# Patient Record
Sex: Female | Born: 1935
Health system: Southern US, Community
[De-identification: ages and names within clinical notes are randomized; demographics above are authoritative.]

## PROBLEM LIST (undated history)

## (undated) DIAGNOSIS — I341 Nonrheumatic mitral (valve) prolapse: Secondary | ICD-10-CM

## (undated) DIAGNOSIS — Z950 Presence of cardiac pacemaker: Secondary | ICD-10-CM

## (undated) DIAGNOSIS — R51 Headache: Secondary | ICD-10-CM

## (undated) DIAGNOSIS — M26629 Arthralgia of temporomandibular joint, unspecified side: Secondary | ICD-10-CM

## (undated) DIAGNOSIS — M4800 Spinal stenosis, site unspecified: Secondary | ICD-10-CM

## (undated) DIAGNOSIS — IMO0002 Reserved for concepts with insufficient information to code with codable children: Secondary | ICD-10-CM

## (undated) DIAGNOSIS — E039 Hypothyroidism, unspecified: Secondary | ICD-10-CM

## (undated) DIAGNOSIS — F419 Anxiety disorder, unspecified: Secondary | ICD-10-CM

## (undated) DIAGNOSIS — I491 Atrial premature depolarization: Secondary | ICD-10-CM

## (undated) DIAGNOSIS — R519 Headache, unspecified: Secondary | ICD-10-CM

## (undated) DIAGNOSIS — E785 Hyperlipidemia, unspecified: Secondary | ICD-10-CM

## (undated) DIAGNOSIS — R002 Palpitations: Secondary | ICD-10-CM

## (undated) DIAGNOSIS — G8929 Other chronic pain: Secondary | ICD-10-CM

## (undated) HISTORY — DX: Anxiety disorder, unspecified: F41.9

## (undated) HISTORY — PX: APPENDECTOMY: SHX54

## (undated) HISTORY — DX: Other chronic pain: G89.29

## (undated) HISTORY — DX: Palpitations: R00.2

## (undated) HISTORY — PX: TONSILLECTOMY: SUR1361

## (undated) HISTORY — PX: BREAST BIOPSY: SHX20

## (undated) HISTORY — DX: Hyperlipidemia, unspecified: E78.5

## (undated) HISTORY — DX: Reserved for concepts with insufficient information to code with codable children: IMO0002

## (undated) HISTORY — DX: Arthralgia of temporomandibular joint, unspecified side: M26.629

## (undated) HISTORY — PX: BACK SURGERY: SHX140

## (undated) HISTORY — DX: Hypothyroidism, unspecified: E03.9

## (undated) HISTORY — DX: Spinal stenosis, site unspecified: M48.00

## (undated) HISTORY — DX: Headache: R51

## (undated) HISTORY — DX: Headache, unspecified: R51.9

## (undated) HISTORY — DX: Atrial premature depolarization: I49.1

## (undated) HISTORY — PX: LUMBAR MICRODISCECTOMY: SHX99

## (undated) HISTORY — PX: CHOLECYSTECTOMY: SHX55

## (undated) HISTORY — DX: Nonrheumatic mitral (valve) prolapse: I34.1

---
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
## 1968-07-05 HISTORY — PX: BREAST EXCISIONAL BIOPSY: SUR124

## 1998-04-14 ENCOUNTER — Other Ambulatory Visit: Admission: RE | Admit: 1998-04-14 | Discharge: 1998-04-14 | Payer: Self-pay | Admitting: *Deleted

## 1999-05-19 ENCOUNTER — Other Ambulatory Visit: Admission: RE | Admit: 1999-05-19 | Discharge: 1999-05-19 | Payer: Self-pay | Admitting: *Deleted

## 1999-08-11 ENCOUNTER — Encounter: Payer: Self-pay | Admitting: *Deleted

## 1999-08-11 ENCOUNTER — Encounter: Admission: RE | Admit: 1999-08-11 | Discharge: 1999-08-11 | Payer: Self-pay | Admitting: *Deleted

## 2000-08-12 ENCOUNTER — Encounter: Payer: Self-pay | Admitting: Internal Medicine

## 2000-08-12 ENCOUNTER — Encounter: Admission: RE | Admit: 2000-08-12 | Discharge: 2000-08-12 | Payer: Self-pay | Admitting: Internal Medicine

## 2000-09-19 ENCOUNTER — Encounter: Admission: RE | Admit: 2000-09-19 | Discharge: 2000-09-19 | Payer: Self-pay | Admitting: Internal Medicine

## 2000-09-19 ENCOUNTER — Encounter: Payer: Self-pay | Admitting: Internal Medicine

## 2000-11-22 ENCOUNTER — Ambulatory Visit (HOSPITAL_COMMUNITY): Admission: RE | Admit: 2000-11-22 | Discharge: 2000-11-22 | Payer: Self-pay | Admitting: Gastroenterology

## 2000-11-24 ENCOUNTER — Other Ambulatory Visit: Admission: RE | Admit: 2000-11-24 | Discharge: 2000-11-24 | Payer: Self-pay | Admitting: *Deleted

## 2000-12-13 ENCOUNTER — Ambulatory Visit (HOSPITAL_COMMUNITY): Admission: RE | Admit: 2000-12-13 | Discharge: 2000-12-13 | Payer: Self-pay | Admitting: Gastroenterology

## 2001-03-01 ENCOUNTER — Emergency Department (HOSPITAL_COMMUNITY): Admission: EM | Admit: 2001-03-01 | Discharge: 2001-03-01 | Payer: Self-pay | Admitting: Emergency Medicine

## 2001-05-12 ENCOUNTER — Inpatient Hospital Stay (HOSPITAL_COMMUNITY): Admission: EM | Admit: 2001-05-12 | Discharge: 2001-05-14 | Payer: Self-pay

## 2001-05-13 ENCOUNTER — Encounter: Payer: Self-pay | Admitting: Internal Medicine

## 2001-08-14 ENCOUNTER — Encounter: Payer: Self-pay | Admitting: Internal Medicine

## 2001-08-14 ENCOUNTER — Encounter: Admission: RE | Admit: 2001-08-14 | Discharge: 2001-08-14 | Payer: Self-pay | Admitting: Internal Medicine

## 2001-10-15 ENCOUNTER — Emergency Department (HOSPITAL_COMMUNITY): Admission: EM | Admit: 2001-10-15 | Discharge: 2001-10-15 | Payer: Self-pay | Admitting: Emergency Medicine

## 2001-11-30 ENCOUNTER — Other Ambulatory Visit: Admission: RE | Admit: 2001-11-30 | Discharge: 2001-11-30 | Payer: Self-pay | Admitting: *Deleted

## 2002-01-16 ENCOUNTER — Emergency Department (HOSPITAL_COMMUNITY): Admission: EM | Admit: 2002-01-16 | Discharge: 2002-01-16 | Payer: Self-pay | Admitting: Emergency Medicine

## 2002-10-08 ENCOUNTER — Encounter: Admission: RE | Admit: 2002-10-08 | Discharge: 2002-10-08 | Payer: Self-pay | Admitting: Neurosurgery

## 2002-10-08 ENCOUNTER — Encounter: Payer: Self-pay | Admitting: Orthopedic Surgery

## 2002-12-04 ENCOUNTER — Other Ambulatory Visit: Admission: RE | Admit: 2002-12-04 | Discharge: 2002-12-04 | Payer: Self-pay | Admitting: *Deleted

## 2003-03-06 ENCOUNTER — Encounter: Admission: RE | Admit: 2003-03-06 | Discharge: 2003-04-18 | Payer: Self-pay | Admitting: Neurology

## 2003-12-05 ENCOUNTER — Other Ambulatory Visit: Admission: RE | Admit: 2003-12-05 | Discharge: 2003-12-05 | Payer: Self-pay | Admitting: *Deleted

## 2004-08-01 ENCOUNTER — Encounter: Admission: RE | Admit: 2004-08-01 | Discharge: 2004-08-01 | Payer: Self-pay | Admitting: Internal Medicine

## 2005-09-02 ENCOUNTER — Encounter: Admission: RE | Admit: 2005-09-02 | Discharge: 2005-09-02 | Payer: Self-pay | Admitting: *Deleted

## 2006-09-06 ENCOUNTER — Encounter: Admission: RE | Admit: 2006-09-06 | Discharge: 2006-09-06 | Payer: Self-pay | Admitting: *Deleted

## 2007-09-08 ENCOUNTER — Encounter: Admission: RE | Admit: 2007-09-08 | Discharge: 2007-09-08 | Payer: Self-pay | Admitting: Obstetrics and Gynecology

## 2007-09-19 ENCOUNTER — Encounter: Admission: RE | Admit: 2007-09-19 | Discharge: 2007-09-19 | Payer: Self-pay | Admitting: Obstetrics and Gynecology

## 2008-01-02 ENCOUNTER — Encounter: Admission: RE | Admit: 2008-01-02 | Discharge: 2008-01-02 | Payer: Self-pay | Admitting: Orthopedic Surgery

## 2008-01-03 ENCOUNTER — Encounter: Admission: RE | Admit: 2008-01-03 | Discharge: 2008-01-03 | Payer: Self-pay | Admitting: Orthopedic Surgery

## 2008-02-26 ENCOUNTER — Encounter: Admission: RE | Admit: 2008-02-26 | Discharge: 2008-02-26 | Payer: Self-pay | Admitting: Orthopedic Surgery

## 2008-05-31 ENCOUNTER — Ambulatory Visit (HOSPITAL_COMMUNITY): Admission: RE | Admit: 2008-05-31 | Discharge: 2008-05-31 | Payer: Self-pay | Admitting: Family Medicine

## 2008-07-31 ENCOUNTER — Encounter: Admission: RE | Admit: 2008-07-31 | Discharge: 2008-07-31 | Payer: Self-pay | Admitting: *Deleted

## 2008-09-11 ENCOUNTER — Encounter: Admission: RE | Admit: 2008-09-11 | Discharge: 2008-09-11 | Payer: Self-pay | Admitting: Obstetrics and Gynecology

## 2009-07-31 ENCOUNTER — Encounter: Payer: Self-pay | Admitting: Cardiology

## 2009-09-16 ENCOUNTER — Encounter: Admission: RE | Admit: 2009-09-16 | Discharge: 2009-09-16 | Payer: Self-pay | Admitting: Internal Medicine

## 2009-11-11 ENCOUNTER — Emergency Department (HOSPITAL_COMMUNITY)
Admission: EM | Admit: 2009-11-11 | Discharge: 2009-11-11 | Payer: Self-pay | Source: Home / Self Care | Admitting: Emergency Medicine

## 2010-04-22 ENCOUNTER — Ambulatory Visit (HOSPITAL_BASED_OUTPATIENT_CLINIC_OR_DEPARTMENT_OTHER): Admission: RE | Admit: 2010-04-22 | Discharge: 2010-04-22 | Payer: Self-pay | Admitting: Orthopedic Surgery

## 2010-07-27 ENCOUNTER — Encounter: Payer: Self-pay | Admitting: Orthopedic Surgery

## 2010-09-07 ENCOUNTER — Other Ambulatory Visit: Payer: Self-pay | Admitting: Internal Medicine

## 2010-09-07 DIAGNOSIS — Z1231 Encounter for screening mammogram for malignant neoplasm of breast: Secondary | ICD-10-CM

## 2010-09-16 LAB — POCT HEMOGLOBIN-HEMACUE: Hemoglobin: 13.8 g/dL (ref 12.0–15.0)

## 2010-09-22 LAB — CBC
HCT: 40.6 % (ref 36.0–46.0)
Platelets: 402 10*3/uL — ABNORMAL HIGH (ref 150–400)
RBC: 4.7 MIL/uL (ref 3.87–5.11)
RDW: 16 % — ABNORMAL HIGH (ref 11.5–15.5)

## 2010-09-22 LAB — BASIC METABOLIC PANEL
BUN: 9 mg/dL (ref 6–23)
CO2: 29 mEq/L (ref 19–32)
Calcium: 9.8 mg/dL (ref 8.4–10.5)
GFR calc Af Amer: >60 mL/min (ref >60)
Glucose, Bld: 121 mg/dL — ABNORMAL HIGH (ref 70–99)
Potassium: 3.7 mEq/L (ref 3.5–5.1)
Sodium: 140 mEq/L (ref 135–145)

## 2010-09-22 LAB — DIFFERENTIAL
Basophils Absolute: 0 10*3/uL (ref 0.0–0.1)
Basophils Relative: 0 % (ref 0–1)
Lymphs Abs: 1.9 10*3/uL (ref 0.7–4.0)
Neutro Abs: 13.1 10*3/uL — ABNORMAL HIGH (ref 1.7–7.7)
Neutrophils Relative %: 84 % — ABNORMAL HIGH (ref 43–77)

## 2010-09-22 LAB — POCT CARDIAC MARKERS
CKMB, poc: 2.3 ng/mL (ref 1.0–8.0)
Myoglobin, poc: 98.2 ng/mL (ref 12–200)

## 2010-09-25 ENCOUNTER — Ambulatory Visit
Admission: RE | Admit: 2010-09-25 | Discharge: 2010-09-25 | Disposition: A | Discharge status: Home / Self Care | Payer: Medicare Other | Source: Ambulatory Visit | Attending: Internal Medicine | Admitting: Internal Medicine

## 2010-09-25 DIAGNOSIS — Z1231 Encounter for screening mammogram for malignant neoplasm of breast: Secondary | ICD-10-CM

## 2010-11-20 NOTE — Procedures (Signed)
[place]. [facility]  Patient:    Laurie Klein, Laurie Klein                         MRN: [id number] Proc. Date: 11/22/00 Adm. Date:  [id number] Attending:  Charna Elizabeth CC:         Lilly Cove, M.D.   Procedure Report  DATE OF BIRTH:  [birth year]  PROCEDURE PERFORMED:  Esophagogastroduodenoscopy.  ENDOSCOPIST:  Anselmo Rod, M.D.  INSTRUMENT USED:  Olympus video panendoscope.  INDICATION FOR PROCEDURE:  Dysphagia in a 77 year old white female rule out esophageal stricture, esophagitis, etc.  PREPROCEDURE PREPARATION: Informed consent was procured from the patient. The patient was fasted for eight hours prior to the procedure and received a gram of Ancef prior to the EGD.  PREPROCEDURE PHYSICAL:  VITAL SIGNS:  The patient had stable vital signs.  NECK:  Supple.  CHEST: Clear to auscultation.  S1 and S2 regular.  ABDOMEN: Soft with normal bowel sounds.  DESCRIPTION OF PROCEDURE:  The patient was placed in the left lateral decubitus position and sedated with 25 mg of Demerol and 5 mg of Versed intravenously. Once the patient was adequately sedated and maintained on low flow oxygen and continuous cardiac monitoring, the Olympus video panendoscope was advanced through the mouth piece over the tongue into the esophagus under direct vision.  The entire esophagus was widely patent without evidence of erosions, ulcerations, strictures or esophagitis.  There was a small hiatal hernia seen on high retroflexion.  The rest of the gastric mucosa and proximal small bowel appeared normal.  IMPRESSION: 1. Normal esophagogastroduodenoscopy except for small hiatal hernia. 2. Widely patent esophagus.  RECOMMENDATIONS: 1. If the patients symptoms persist esophageal manometry may be required. 2. Outpatient follow up is advised in the next two weeks. DD:  11/22/00 TD:  11/22/00 Job: [id number]

## 2010-11-20 NOTE — Procedures (Signed)
[place]. [facility]  Patient:    Laurie Klein, Laurie Klein                         MRN: [id number] Proc. Date: 12/13/00 Adm. Date:  [id number] Attending:  Charna Elizabeth CC:         Lilly Cove, M.D.  Pershing Cox, M.D.   Procedure Report  DATE OF BIRTH:  1931-05-18  REFERRING PHYSICIAN:  Lilly Cove, M.D.  PROCEDURE PERFORMED:  Screening colonoscopy.  ENDOSCOPIST:  Anselmo Rod, M.D.  INSTRUMENT USED:  Olympus video colonoscope.  INDICATIONS FOR PROCEDURE:  Screening colonoscopy being performed in a 77 year old white female rule out colonic polyps, masses, hemorrhoids, etc.  PREPROCEDURE PREPARATION:  Informed consent was procured from the patient. The patient was fasted for eight hours prior to the procedure and prepped with a bottle of magnesium citrate and a gallon of NuLytely the night prior to the procedure.  PREPROCEDURE PHYSICAL:  The patient had stable vital signs.  Neck supple. Chest clear to auscultation.  S1, S2 regular.  Abdomen soft with normal abdominal bowel sounds.  DESCRIPTION OF PROCEDURE:  The patient was placed in the left lateral decubitus position and sedated with 50 mg of Demerol and 7 mg of Versed intravenously.  Once the patient was adequately sedated and maintained on low-flow oxygen and continuous cardiac monitoring, the Olympus video colonoscope was advanced from the rectum to the cecum without difficulty.  The patient tolerated the procedure well without complication.  No masses, polyps, erosions, ulcerations or diverticula were seen.  The patient had small internal and external hemorrhoids seen on retroflexion and anal inspection respectively.  IMPRESSION:  Healthy-appearing colon except for small nonbleeding internal and external hemorrhoids.  RECOMMENDATIONS: 1. Repeat colorectal cancer screening is recommended in the next 10 years    unless the patient were to develop any abnormal symptoms in the  interim. 2. A high fiber diet has been recommended. 3. Outpatient follow-up is advised on a p.r.n. basis.  ADDENDUM:  The patient was not given antibiotics for ____________ prophylaxis as she was on Augmentin for recent sinus infection given to her by Dr. Karilyn Cota.  Dr. Karilyn Cota advised the patient that she would not require additional antibiotics for the procedure.  RECOMMENDATIONS:DD:  12/13/00 TD:  12/13/00 Job: [id number]

## 2010-11-20 NOTE — H&P (Signed)
[facility]  Patient:    Laurie Klein, Laurie Klein Visit Number: [id number] MRN: [id number]          Service Type: MED Location: 3W [id number] Attending Physician:  Wilson Singer Dictated by:   Ralene Ok, M.D. Admit Date:  05/12/2001                           History and Physical  PRESENTING COMPLAINT:  Chest pain.  HISTORY OF PRESENT COMPLAINT:  This 77 year old white female presented to the emergency room with a history of heaviness in her chest.  She said the heaviness had been on and off for the past day and had been also noticing some discomfort in her right lower limb.  The patient said that while she was showering, she felt her left leg falling asleep.  The patient was subsequently seen by her neurologist, Dr. Clarisse Gouge, and advised to come to the emergency room in view of the chest pain symptoms.  PAST HISTORY:  The patient gives no past medical history of coronary artery disease. 1. Her main medical problem recently has been headaches, which has been on and    off for the past month or so and is now almost persistent.  The patient is    undergoing evaluation for temporal arteritis, in view of an elevated sed    rate at 60 according to the patient and had a recent temporal artery biopsy    with pathology report still pending.  The patient was given a prescription    for prednisone today, but has not filled this. 2. Her other main medical history includes hypothyroidism. 3. Insomnia. 4. She says recent CT scan has shown evidence of CVA, but the patient gives no    clinical history of CVA.  SURGICAL HISTORY: 1. Temporal artery biopsy as mentioned above. 2. The patient is also status post tonsillectomy. 3. Status post appendectomy. 4. Status post cholecystectomy.  SOCIAL HISTORY:  The patient is a nonsmoker, does not drink any alcohol.  The patient lives with her husband.  MEDICATION LIST: 1. The patient is on Synthroid, recently increased to 75 mg  daily. 2. She is also taking nortriptyline. 3. She had been on some Vicodin for headaches. 4. Anaprox. 5. She is on Evista. 6. Calcium supplement for osteoporosis.  ALLERGIES:  She has an allergy for FLEXERIL and KETOPROFEN, although the patient is able to tolerate other nonsteroidal anti-inflammatory drugs.  REVIEW OF SYSTEMS:  The patient admits to significant insomnia, relates some of this due to her headaches.  The patient also has some backache with a past history of back surgery.  She admits to constipation at times.  Micturation was normal.  Denies any dizziness or visual symptoms with her headaches.  No history of recent weight loss or low grade fever and the patient had been ambulating until this episode.  PHYSICAL EXAMINATION:  VITAL SIGNS:  The patient is afebrile, heart rate was 80 per minute and regular, blood pressure was 140/90.  HEENT:  Appeared normal.  NECK:  Supple.  There was no adenopathy.  Thyroid was nonpalpable.  There were no bruits noted.  Previous temporal artery biopsy was noted on the left side.  CHEST:  Auscultation of the chest was clear.  HEART:  Auscultation of the heart showed a soft systolic murmur at the apex, no definite click was noted.  ABDOMEN:  There were no bruits in the abdomen.  Abdomen was soft  to palpation. There was no tenderness.  Liver and spleen were not palpable.  Bowel sounds were heard normally.  RECTAL/PELVIC:  Exam was deferred.  BREASTS:  Examination of the breasts was normal and there was no adenopathy.  CENTRAL NERVOUS SYSTEM:  Showed the patient to be oriented in time and place. There were no cranial nerve deficits noted.  Power and tone were equal bilaterally with reflexes diminished bilaterally and plantars flexor.  Gait was not tested.  SKIN:  Examination of the skin showed no lesions and peripheral pulses were palpable equally.  LABORATORY INVESTIGATIONS:  Significant for normal EKG and normal CK  and troponin levels.  Her potassium level was slightly decreased at 3.2.  Magnesium and phosphate are normal.  Chest x-ray shows probable right lower lobe atelectasis.  PROBLEMS: 1. Chest pain, atypical in nature with no prior history of coronary artery    disease.  Patient to be admitted to rule out coronary artery disease with    coronary artery vasculitis. 2. Headache with probable temporal arteritis. 3. Hypothyroidism. 4. Insomnia. 5. Hypokalemia.  PLAN: The patient will be admitted to a monitored bed and will repeat her cardiac enzymes x 2 and an EKG.  The patient will be given IV Solu-Medrol to cover the temporal arteritis diagnosis and vasculitis, and will place her on prednisone in the morning at 20 mg t.i.d.  Will also place the patient on proton pump inhibitor to cover GI cause of chest pain and continue her on aspirin.  Will also replace her potassium in view of the mild hypokalemia. The patient will be placed on Dr. Balinda Quails service, as he is her primary care physician. Dictated by:   Ralene Ok, M.D. Attending Physician:  Wilson Singer DD:  05/12/01 TD:  05/13/01 Job: [id number]

## 2011-03-02 ENCOUNTER — Other Ambulatory Visit: Payer: Self-pay | Admitting: Obstetrics & Gynecology

## 2011-03-02 DIAGNOSIS — M8580 Other specified disorders of bone density and structure, unspecified site: Secondary | ICD-10-CM

## 2011-04-22 ENCOUNTER — Other Ambulatory Visit: Payer: Medicare Other

## 2011-04-23 ENCOUNTER — Ambulatory Visit
Admission: RE | Admit: 2011-04-23 | Discharge: 2011-04-23 | Disposition: A | Discharge status: Home / Self Care | Payer: Medicare Other | Source: Ambulatory Visit | Attending: Obstetrics & Gynecology | Admitting: Obstetrics & Gynecology

## 2011-04-23 DIAGNOSIS — M8580 Other specified disorders of bone density and structure, unspecified site: Secondary | ICD-10-CM

## 2011-04-29 ENCOUNTER — Emergency Department (HOSPITAL_COMMUNITY)
Admission: EM | Admit: 2011-04-29 | Discharge: 2011-04-29 | Disposition: A | Discharge status: Home / Self Care | Payer: Medicare Other | Attending: Emergency Medicine | Admitting: Emergency Medicine

## 2011-04-29 DIAGNOSIS — Z85820 Personal history of malignant melanoma of skin: Secondary | ICD-10-CM | POA: Insufficient documentation

## 2011-04-29 DIAGNOSIS — E039 Hypothyroidism, unspecified: Secondary | ICD-10-CM | POA: Insufficient documentation

## 2011-04-29 DIAGNOSIS — Z79899 Other long term (current) drug therapy: Secondary | ICD-10-CM | POA: Insufficient documentation

## 2011-04-29 DIAGNOSIS — E7800 Pure hypercholesterolemia, unspecified: Secondary | ICD-10-CM | POA: Insufficient documentation

## 2011-04-29 DIAGNOSIS — Z7982 Long term (current) use of aspirin: Secondary | ICD-10-CM | POA: Insufficient documentation

## 2011-04-29 DIAGNOSIS — I059 Rheumatic mitral valve disease, unspecified: Secondary | ICD-10-CM | POA: Insufficient documentation

## 2011-04-29 DIAGNOSIS — R55 Syncope and collapse: Secondary | ICD-10-CM | POA: Insufficient documentation

## 2011-04-29 LAB — BASIC METABOLIC PANEL
BUN: 14 mg/dL (ref 6–23)
Chloride: 98 mEq/L (ref 96–112)
Creatinine, Ser: 0.64 mg/dL (ref 0.50–1.10)
GFR calc non Af Amer: 83 mL/min — ABNORMAL LOW (ref >90)
Glucose, Bld: 104 mg/dL — ABNORMAL HIGH (ref 70–99)
Potassium: 3.8 mEq/L (ref 3.5–5.1)
Sodium: 137 mEq/L (ref 135–145)

## 2011-04-30 LAB — GLUCOSE, CAPILLARY

## 2011-07-13 DIAGNOSIS — H5200 Hypermetropia, unspecified eye: Secondary | ICD-10-CM | POA: Diagnosis not present

## 2011-07-13 DIAGNOSIS — H2510 Age-related nuclear cataract, unspecified eye: Secondary | ICD-10-CM | POA: Diagnosis not present

## 2011-08-05 DIAGNOSIS — E7800 Pure hypercholesterolemia, unspecified: Secondary | ICD-10-CM | POA: Diagnosis not present

## 2011-08-05 DIAGNOSIS — Z79899 Other long term (current) drug therapy: Secondary | ICD-10-CM | POA: Diagnosis not present

## 2011-08-05 DIAGNOSIS — Z0000 Encounter for general adult medical examination without abnormal findings: Secondary | ICD-10-CM | POA: Diagnosis not present

## 2011-08-05 DIAGNOSIS — E039 Hypothyroidism, unspecified: Secondary | ICD-10-CM | POA: Diagnosis not present

## 2011-08-05 DIAGNOSIS — M4800 Spinal stenosis, site unspecified: Secondary | ICD-10-CM | POA: Diagnosis not present

## 2011-08-10 DIAGNOSIS — E039 Hypothyroidism, unspecified: Secondary | ICD-10-CM | POA: Diagnosis not present

## 2011-08-10 DIAGNOSIS — Z79899 Other long term (current) drug therapy: Secondary | ICD-10-CM | POA: Diagnosis not present

## 2011-08-10 DIAGNOSIS — E7800 Pure hypercholesterolemia, unspecified: Secondary | ICD-10-CM | POA: Diagnosis not present

## 2011-08-12 DIAGNOSIS — IMO0002 Reserved for concepts with insufficient information to code with codable children: Secondary | ICD-10-CM | POA: Diagnosis not present

## 2011-08-12 DIAGNOSIS — H2510 Age-related nuclear cataract, unspecified eye: Secondary | ICD-10-CM | POA: Diagnosis not present

## 2011-09-01 ENCOUNTER — Other Ambulatory Visit: Payer: Self-pay | Admitting: Obstetrics & Gynecology

## 2011-09-01 ENCOUNTER — Other Ambulatory Visit: Payer: Self-pay | Admitting: Internal Medicine

## 2011-09-01 DIAGNOSIS — Z1231 Encounter for screening mammogram for malignant neoplasm of breast: Secondary | ICD-10-CM

## 2011-09-16 DIAGNOSIS — IMO0002 Reserved for concepts with insufficient information to code with codable children: Secondary | ICD-10-CM | POA: Diagnosis not present

## 2011-09-16 DIAGNOSIS — H2510 Age-related nuclear cataract, unspecified eye: Secondary | ICD-10-CM | POA: Diagnosis not present

## 2011-09-29 ENCOUNTER — Ambulatory Visit
Admission: RE | Admit: 2011-09-29 | Discharge: 2011-09-29 | Disposition: A | Discharge status: Home / Self Care | Payer: Medicare Other | Source: Ambulatory Visit | Attending: Obstetrics & Gynecology | Admitting: Obstetrics & Gynecology

## 2011-09-29 DIAGNOSIS — Z1231 Encounter for screening mammogram for malignant neoplasm of breast: Secondary | ICD-10-CM

## 2011-10-05 DIAGNOSIS — E039 Hypothyroidism, unspecified: Secondary | ICD-10-CM | POA: Diagnosis not present

## 2012-01-20 DIAGNOSIS — Z79899 Other long term (current) drug therapy: Secondary | ICD-10-CM | POA: Diagnosis not present

## 2012-01-20 DIAGNOSIS — Z0000 Encounter for general adult medical examination without abnormal findings: Secondary | ICD-10-CM | POA: Diagnosis not present

## 2012-01-20 DIAGNOSIS — E039 Hypothyroidism, unspecified: Secondary | ICD-10-CM | POA: Diagnosis not present

## 2012-01-20 DIAGNOSIS — E7800 Pure hypercholesterolemia, unspecified: Secondary | ICD-10-CM | POA: Diagnosis not present

## 2012-03-31 DIAGNOSIS — E039 Hypothyroidism, unspecified: Secondary | ICD-10-CM | POA: Diagnosis not present

## 2012-03-31 DIAGNOSIS — E7800 Pure hypercholesterolemia, unspecified: Secondary | ICD-10-CM | POA: Diagnosis not present

## 2012-03-31 DIAGNOSIS — Z7189 Other specified counseling: Secondary | ICD-10-CM | POA: Diagnosis not present

## 2012-05-19 DIAGNOSIS — L570 Actinic keratosis: Secondary | ICD-10-CM | POA: Diagnosis not present

## 2012-05-19 DIAGNOSIS — L821 Other seborrheic keratosis: Secondary | ICD-10-CM | POA: Diagnosis not present

## 2012-05-19 DIAGNOSIS — Z85820 Personal history of malignant melanoma of skin: Secondary | ICD-10-CM | POA: Diagnosis not present

## 2012-05-19 DIAGNOSIS — I781 Nevus, non-neoplastic: Secondary | ICD-10-CM | POA: Diagnosis not present

## 2012-05-19 DIAGNOSIS — Z411 Encounter for cosmetic surgery: Secondary | ICD-10-CM | POA: Diagnosis not present

## 2012-05-19 DIAGNOSIS — D239 Other benign neoplasm of skin, unspecified: Secondary | ICD-10-CM | POA: Diagnosis not present

## 2012-07-11 DIAGNOSIS — M899 Disorder of bone, unspecified: Secondary | ICD-10-CM | POA: Diagnosis not present

## 2012-07-11 DIAGNOSIS — Z79899 Other long term (current) drug therapy: Secondary | ICD-10-CM | POA: Diagnosis not present

## 2012-07-11 DIAGNOSIS — M949 Disorder of cartilage, unspecified: Secondary | ICD-10-CM | POA: Diagnosis not present

## 2012-07-11 DIAGNOSIS — E7800 Pure hypercholesterolemia, unspecified: Secondary | ICD-10-CM | POA: Diagnosis not present

## 2012-08-28 ENCOUNTER — Other Ambulatory Visit: Payer: Self-pay | Admitting: Obstetrics & Gynecology

## 2012-08-28 DIAGNOSIS — Z1231 Encounter for screening mammogram for malignant neoplasm of breast: Secondary | ICD-10-CM

## 2012-09-22 DIAGNOSIS — M719 Bursopathy, unspecified: Secondary | ICD-10-CM | POA: Diagnosis not present

## 2012-09-22 DIAGNOSIS — M67919 Unspecified disorder of synovium and tendon, unspecified shoulder: Secondary | ICD-10-CM | POA: Diagnosis not present

## 2012-09-29 ENCOUNTER — Ambulatory Visit
Admission: RE | Admit: 2012-09-29 | Discharge: 2012-09-29 | Disposition: A | Discharge status: Home / Self Care | Payer: Medicare Other | Source: Ambulatory Visit | Attending: Obstetrics & Gynecology | Admitting: Obstetrics & Gynecology

## 2012-09-29 DIAGNOSIS — Z1231 Encounter for screening mammogram for malignant neoplasm of breast: Secondary | ICD-10-CM | POA: Diagnosis not present

## 2012-10-17 DIAGNOSIS — H04129 Dry eye syndrome of unspecified lacrimal gland: Secondary | ICD-10-CM | POA: Diagnosis not present

## 2012-10-17 DIAGNOSIS — Z961 Presence of intraocular lens: Secondary | ICD-10-CM | POA: Diagnosis not present

## 2012-10-17 DIAGNOSIS — H52209 Unspecified astigmatism, unspecified eye: Secondary | ICD-10-CM | POA: Diagnosis not present

## 2012-12-04 DIAGNOSIS — M719 Bursopathy, unspecified: Secondary | ICD-10-CM | POA: Diagnosis not present

## 2012-12-12 DIAGNOSIS — M25519 Pain in unspecified shoulder: Secondary | ICD-10-CM | POA: Diagnosis not present

## 2012-12-12 DIAGNOSIS — M67919 Unspecified disorder of synovium and tendon, unspecified shoulder: Secondary | ICD-10-CM | POA: Diagnosis not present

## 2012-12-15 DIAGNOSIS — M25519 Pain in unspecified shoulder: Secondary | ICD-10-CM | POA: Diagnosis not present

## 2012-12-15 DIAGNOSIS — M719 Bursopathy, unspecified: Secondary | ICD-10-CM | POA: Diagnosis not present

## 2012-12-15 DIAGNOSIS — M67919 Unspecified disorder of synovium and tendon, unspecified shoulder: Secondary | ICD-10-CM | POA: Diagnosis not present

## 2012-12-19 DIAGNOSIS — M25519 Pain in unspecified shoulder: Secondary | ICD-10-CM | POA: Diagnosis not present

## 2012-12-19 DIAGNOSIS — M719 Bursopathy, unspecified: Secondary | ICD-10-CM | POA: Diagnosis not present

## 2012-12-19 DIAGNOSIS — M67919 Unspecified disorder of synovium and tendon, unspecified shoulder: Secondary | ICD-10-CM | POA: Diagnosis not present

## 2012-12-22 DIAGNOSIS — M719 Bursopathy, unspecified: Secondary | ICD-10-CM | POA: Diagnosis not present

## 2012-12-22 DIAGNOSIS — M67919 Unspecified disorder of synovium and tendon, unspecified shoulder: Secondary | ICD-10-CM | POA: Diagnosis not present

## 2012-12-22 DIAGNOSIS — M25519 Pain in unspecified shoulder: Secondary | ICD-10-CM | POA: Diagnosis not present

## 2012-12-27 DIAGNOSIS — M719 Bursopathy, unspecified: Secondary | ICD-10-CM | POA: Diagnosis not present

## 2012-12-27 DIAGNOSIS — M25519 Pain in unspecified shoulder: Secondary | ICD-10-CM | POA: Diagnosis not present

## 2012-12-27 DIAGNOSIS — M67919 Unspecified disorder of synovium and tendon, unspecified shoulder: Secondary | ICD-10-CM | POA: Diagnosis not present

## 2013-01-01 DIAGNOSIS — M67919 Unspecified disorder of synovium and tendon, unspecified shoulder: Secondary | ICD-10-CM | POA: Diagnosis not present

## 2013-01-01 DIAGNOSIS — M25519 Pain in unspecified shoulder: Secondary | ICD-10-CM | POA: Diagnosis not present

## 2013-01-01 DIAGNOSIS — M719 Bursopathy, unspecified: Secondary | ICD-10-CM | POA: Diagnosis not present

## 2013-01-02 DIAGNOSIS — M25519 Pain in unspecified shoulder: Secondary | ICD-10-CM | POA: Diagnosis not present

## 2013-01-03 DIAGNOSIS — M25519 Pain in unspecified shoulder: Secondary | ICD-10-CM | POA: Diagnosis not present

## 2013-01-03 DIAGNOSIS — M719 Bursopathy, unspecified: Secondary | ICD-10-CM | POA: Diagnosis not present

## 2013-01-03 DIAGNOSIS — M67919 Unspecified disorder of synovium and tendon, unspecified shoulder: Secondary | ICD-10-CM | POA: Diagnosis not present

## 2013-01-11 DIAGNOSIS — M67919 Unspecified disorder of synovium and tendon, unspecified shoulder: Secondary | ICD-10-CM | POA: Diagnosis not present

## 2013-01-11 DIAGNOSIS — M25519 Pain in unspecified shoulder: Secondary | ICD-10-CM | POA: Diagnosis not present

## 2013-01-16 DIAGNOSIS — Z0000 Encounter for general adult medical examination without abnormal findings: Secondary | ICD-10-CM | POA: Diagnosis not present

## 2013-01-16 DIAGNOSIS — Z1331 Encounter for screening for depression: Secondary | ICD-10-CM | POA: Diagnosis not present

## 2013-01-16 DIAGNOSIS — R002 Palpitations: Secondary | ICD-10-CM | POA: Diagnosis not present

## 2013-01-16 DIAGNOSIS — Z79899 Other long term (current) drug therapy: Secondary | ICD-10-CM | POA: Diagnosis not present

## 2013-01-16 DIAGNOSIS — M899 Disorder of bone, unspecified: Secondary | ICD-10-CM | POA: Diagnosis not present

## 2013-01-16 DIAGNOSIS — E7800 Pure hypercholesterolemia, unspecified: Secondary | ICD-10-CM | POA: Diagnosis not present

## 2013-01-16 DIAGNOSIS — E039 Hypothyroidism, unspecified: Secondary | ICD-10-CM | POA: Diagnosis not present

## 2013-01-16 DIAGNOSIS — M949 Disorder of cartilage, unspecified: Secondary | ICD-10-CM | POA: Diagnosis not present

## 2013-01-17 DIAGNOSIS — M719 Bursopathy, unspecified: Secondary | ICD-10-CM | POA: Diagnosis not present

## 2013-01-17 DIAGNOSIS — M67919 Unspecified disorder of synovium and tendon, unspecified shoulder: Secondary | ICD-10-CM | POA: Diagnosis not present

## 2013-01-17 DIAGNOSIS — M25519 Pain in unspecified shoulder: Secondary | ICD-10-CM | POA: Diagnosis not present

## 2013-02-28 DIAGNOSIS — Z124 Encounter for screening for malignant neoplasm of cervix: Secondary | ICD-10-CM | POA: Diagnosis not present

## 2013-02-28 DIAGNOSIS — Z01419 Encounter for gynecological examination (general) (routine) without abnormal findings: Secondary | ICD-10-CM | POA: Diagnosis not present

## 2013-04-24 DIAGNOSIS — M899 Disorder of bone, unspecified: Secondary | ICD-10-CM | POA: Diagnosis not present

## 2013-05-07 DIAGNOSIS — R011 Cardiac murmur, unspecified: Secondary | ICD-10-CM | POA: Diagnosis not present

## 2013-05-07 DIAGNOSIS — R55 Syncope and collapse: Secondary | ICD-10-CM | POA: Diagnosis not present

## 2013-05-07 DIAGNOSIS — T148XXA Other injury of unspecified body region, initial encounter: Secondary | ICD-10-CM | POA: Diagnosis not present

## 2013-05-21 ENCOUNTER — Encounter: Payer: Self-pay | Admitting: Cardiology

## 2013-05-21 ENCOUNTER — Other Ambulatory Visit: Payer: Self-pay

## 2013-05-21 ENCOUNTER — Ambulatory Visit (HOSPITAL_COMMUNITY): Payer: Medicare Other | Attending: Cardiology | Admitting: Radiology

## 2013-05-21 ENCOUNTER — Other Ambulatory Visit (HOSPITAL_COMMUNITY): Payer: Self-pay | Admitting: Geriatric Medicine

## 2013-05-21 DIAGNOSIS — E785 Hyperlipidemia, unspecified: Secondary | ICD-10-CM | POA: Diagnosis not present

## 2013-05-21 DIAGNOSIS — R011 Cardiac murmur, unspecified: Secondary | ICD-10-CM

## 2013-05-21 DIAGNOSIS — R55 Syncope and collapse: Secondary | ICD-10-CM | POA: Insufficient documentation

## 2013-05-21 NOTE — Progress Notes (Signed)
Echocardiogram performed.  

## 2013-05-24 ENCOUNTER — Other Ambulatory Visit: Payer: Self-pay

## 2013-05-24 ENCOUNTER — Inpatient Hospital Stay (HOSPITAL_COMMUNITY): Payer: Medicare Other

## 2013-05-24 ENCOUNTER — Emergency Department (HOSPITAL_COMMUNITY): Payer: Medicare Other

## 2013-05-24 ENCOUNTER — Inpatient Hospital Stay (HOSPITAL_COMMUNITY)
Admission: EM | Admit: 2013-05-24 | Discharge: 2013-05-26 | DRG: 312 | Disposition: A | Discharge status: Home / Self Care | Payer: Medicare Other | Attending: Internal Medicine | Admitting: Internal Medicine

## 2013-05-24 ENCOUNTER — Encounter (HOSPITAL_COMMUNITY): Payer: Self-pay | Admitting: Emergency Medicine

## 2013-05-24 ENCOUNTER — Observation Stay (HOSPITAL_COMMUNITY): Payer: Medicare Other

## 2013-05-24 DIAGNOSIS — I491 Atrial premature depolarization: Secondary | ICD-10-CM | POA: Diagnosis present

## 2013-05-24 DIAGNOSIS — Z8249 Family history of ischemic heart disease and other diseases of the circulatory system: Secondary | ICD-10-CM

## 2013-05-24 DIAGNOSIS — R55 Syncope and collapse: Secondary | ICD-10-CM | POA: Diagnosis not present

## 2013-05-24 DIAGNOSIS — Z808 Family history of malignant neoplasm of other organs or systems: Secondary | ICD-10-CM | POA: Diagnosis not present

## 2013-05-24 DIAGNOSIS — I341 Nonrheumatic mitral (valve) prolapse: Secondary | ICD-10-CM

## 2013-05-24 DIAGNOSIS — M4800 Spinal stenosis, site unspecified: Secondary | ICD-10-CM | POA: Diagnosis present

## 2013-05-24 DIAGNOSIS — Z823 Family history of stroke: Secondary | ICD-10-CM

## 2013-05-24 DIAGNOSIS — E039 Hypothyroidism, unspecified: Secondary | ICD-10-CM | POA: Diagnosis present

## 2013-05-24 DIAGNOSIS — Z87891 Personal history of nicotine dependence: Secondary | ICD-10-CM

## 2013-05-24 DIAGNOSIS — Z66 Do not resuscitate: Secondary | ICD-10-CM | POA: Diagnosis present

## 2013-05-24 DIAGNOSIS — R05 Cough: Secondary | ICD-10-CM | POA: Diagnosis not present

## 2013-05-24 DIAGNOSIS — E785 Hyperlipidemia, unspecified: Secondary | ICD-10-CM | POA: Diagnosis present

## 2013-05-24 DIAGNOSIS — Z79899 Other long term (current) drug therapy: Secondary | ICD-10-CM | POA: Diagnosis not present

## 2013-05-24 DIAGNOSIS — IMO0002 Reserved for concepts with insufficient information to code with codable children: Secondary | ICD-10-CM

## 2013-05-24 DIAGNOSIS — R110 Nausea: Secondary | ICD-10-CM | POA: Diagnosis not present

## 2013-05-24 DIAGNOSIS — R519 Headache, unspecified: Secondary | ICD-10-CM | POA: Diagnosis present

## 2013-05-24 DIAGNOSIS — S0990XA Unspecified injury of head, initial encounter: Secondary | ICD-10-CM | POA: Diagnosis not present

## 2013-05-24 DIAGNOSIS — I059 Rheumatic mitral valve disease, unspecified: Secondary | ICD-10-CM

## 2013-05-24 DIAGNOSIS — F411 Generalized anxiety disorder: Secondary | ICD-10-CM | POA: Diagnosis present

## 2013-05-24 DIAGNOSIS — R059 Cough, unspecified: Secondary | ICD-10-CM | POA: Diagnosis not present

## 2013-05-24 DIAGNOSIS — R51 Headache: Secondary | ICD-10-CM

## 2013-05-24 DIAGNOSIS — F419 Anxiety disorder, unspecified: Secondary | ICD-10-CM | POA: Diagnosis present

## 2013-05-24 DIAGNOSIS — R404 Transient alteration of awareness: Secondary | ICD-10-CM | POA: Diagnosis not present

## 2013-05-24 HISTORY — DX: Nonrheumatic mitral (valve) prolapse: I34.1

## 2013-05-24 HISTORY — DX: Reserved for concepts with insufficient information to code with codable children: IMO0002

## 2013-05-24 HISTORY — DX: Headache: R51

## 2013-05-24 LAB — CBC WITH DIFFERENTIAL/PLATELET
Basophils Absolute: 0 10*3/uL (ref 0.0–0.1)
HCT: 41.2 % (ref 36.0–46.0)
Hemoglobin: 13.9 g/dL (ref 12.0–15.0)
Lymphocytes Relative: 13 % (ref 12–46)
Monocytes Absolute: 0.4 10*3/uL (ref 0.1–1.0)
Monocytes Relative: 3 % (ref 3–12)
Neutro Abs: 9.6 10*3/uL — ABNORMAL HIGH (ref 1.7–7.7)
RBC: 4.49 MIL/uL (ref 3.87–5.11)
RDW: 14.2 % (ref 11.5–15.5)
WBC: 11.6 10*3/uL — ABNORMAL HIGH (ref 4.0–10.5)

## 2013-05-24 LAB — CBC
HCT: 41.3 % (ref 36.0–46.0)
MCH: 30.6 pg (ref 26.0–34.0)
MCHC: 33.7 g/dL (ref 30.0–36.0)
MCV: 91 fL (ref 78.0–100.0)
Platelets: 353 10*3/uL (ref 150–400)
RDW: 14.3 % (ref 11.5–15.5)
WBC: 11.1 10*3/uL — ABNORMAL HIGH (ref 4.0–10.5)

## 2013-05-24 LAB — TROPONIN I
Troponin I: <0.3 ng/mL (ref <0.30)
Troponin I: <0.3 ng/mL (ref <0.30)
Troponin I: <0.3 ng/mL (ref <0.30)

## 2013-05-24 LAB — URINALYSIS, ROUTINE W REFLEX MICROSCOPIC
Bilirubin Urine: NEGATIVE
Hgb urine dipstick: NEGATIVE
Leukocytes, UA: NEGATIVE
Nitrite: NEGATIVE
Protein, ur: NEGATIVE mg/dL
Specific Gravity, Urine: 1.019 (ref 1.005–1.030)
Specific Gravity, Urine: 1.016 (ref 1.005–1.030)
Urobilinogen, UA: 0.2 mg/dL (ref 0.0–1.0)
pH: 8 (ref 5.0–8.0)

## 2013-05-24 LAB — CREATININE, SERUM
Creatinine, Ser: 0.66 mg/dL (ref 0.50–1.10)
GFR calc Af Amer: >90 mL/min (ref >90)
GFR calc non Af Amer: 81 mL/min — ABNORMAL LOW (ref >90)

## 2013-05-24 LAB — BASIC METABOLIC PANEL
CO2: 27 mEq/L (ref 19–32)
Chloride: 98 mEq/L (ref 96–112)
Creatinine, Ser: 0.67 mg/dL (ref 0.50–1.10)

## 2013-05-24 LAB — URINE MICROSCOPIC-ADD ON

## 2013-05-24 MED ORDER — COENZYME Q10 30 MG PO CAPS: 100.0000 mg | ORAL_CAPSULE | Freq: Two times a day (BID) | ORAL | Status: DC

## 2013-05-24 MED ORDER — POLYETHYLENE GLYCOL 3350 17 G PO PACK
17.0000 g | PACK | Freq: Every day | ORAL | Status: DC | PRN
  Filled 2013-05-24: qty 1

## 2013-05-24 MED ORDER — ACETAMINOPHEN 325 MG PO TABS: 650.0000 mg | ORAL_TABLET | Freq: Four times a day (QID) | ORAL | Status: DC | PRN

## 2013-05-24 MED ORDER — ONDANSETRON HCL 4 MG/2ML IJ SOLN
4.0000 mg | Freq: Once | INTRAMUSCULAR | Status: AC
  Administered 2013-05-24: 4 mg via INTRAVENOUS
  Filled 2013-05-24: qty 2

## 2013-05-24 MED ORDER — ADULT MULTIVITAMIN W/MINERALS CH
1.0000 | ORAL_TABLET | Freq: Every day | ORAL | Status: DC
  Administered 2013-05-24 – 2013-05-26 (×3): 1 via ORAL
  Filled 2013-05-24 (×3): qty 1

## 2013-05-24 MED ORDER — SODIUM CHLORIDE 0.9 % IV BOLUS (SEPSIS)
500.0000 mL | Freq: Once | INTRAVENOUS | Status: AC
  Administered 2013-05-24: 500 mL via INTRAVENOUS

## 2013-05-24 MED ORDER — OMEGA-3-ACID ETHYL ESTERS 1 G PO CAPS
1.0000 g | ORAL_CAPSULE | Freq: Every day | ORAL | Status: DC
  Administered 2013-05-24 – 2013-05-26 (×3): 1 g via ORAL
  Filled 2013-05-24 (×3): qty 1

## 2013-05-24 MED ORDER — ONDANSETRON HCL 4 MG PO TABS: 4.0000 mg | ORAL_TABLET | Freq: Four times a day (QID) | ORAL | Status: DC | PRN

## 2013-05-24 MED ORDER — SODIUM CHLORIDE 0.9 % IJ SOLN
3.0000 mL | Freq: Two times a day (BID) | INTRAMUSCULAR | Status: DC
  Administered 2013-05-24: 3 mL via INTRAVENOUS

## 2013-05-24 MED ORDER — SODIUM CHLORIDE 0.9 % IV SOLN
INTRAVENOUS | Status: DC
  Administered 2013-05-24 – 2013-05-25 (×2): via INTRAVENOUS

## 2013-05-24 MED ORDER — MAGNESIUM CITRATE PO SOLN: 1.0000 | Freq: Once | ORAL | Status: AC | PRN

## 2013-05-24 MED ORDER — ONDANSETRON HCL 4 MG/2ML IJ SOLN: 4.0000 mg | Freq: Four times a day (QID) | INTRAMUSCULAR | Status: DC | PRN

## 2013-05-24 MED ORDER — ENOXAPARIN SODIUM 40 MG/0.4ML ~~LOC~~ SOLN
40.0000 mg | SUBCUTANEOUS | Status: DC
  Administered 2013-05-24 – 2013-05-25 (×2): 40 mg via SUBCUTANEOUS
  Filled 2013-05-24 (×3): qty 0.4

## 2013-05-24 MED ORDER — ASPIRIN 81 MG PO TABS
81.0000 mg | ORAL_TABLET | Freq: Every day | ORAL | Status: DC
  Administered 2013-05-24: 18:00:00 81 mg via ORAL
  Filled 2013-05-24 (×3): qty 1

## 2013-05-24 MED ORDER — SIMVASTATIN 20 MG PO TABS
20.0000 mg | ORAL_TABLET | Freq: Every evening | ORAL | Status: DC
  Administered 2013-05-24 – 2013-05-25 (×2): 20 mg via ORAL
  Filled 2013-05-24 (×3): qty 1

## 2013-05-24 MED ORDER — GADOBENATE DIMEGLUMINE 529 MG/ML IV SOLN
10.0000 mL | Freq: Once | INTRAVENOUS | Status: AC | PRN
  Administered 2013-05-24: 10 mL via INTRAVENOUS

## 2013-05-24 MED ORDER — MULTIVITAMINS PO CAPS
1.0000 | ORAL_CAPSULE | Freq: Every day | ORAL | Status: DC
  Filled 2013-05-24: qty 1

## 2013-05-24 MED ORDER — LEVOTHYROXINE SODIUM 88 MCG PO TABS
88.0000 ug | ORAL_TABLET | Freq: Every day | ORAL | Status: DC
  Administered 2013-05-25 – 2013-05-26 (×2): 88 ug via ORAL
  Filled 2013-05-24 (×3): qty 1

## 2013-05-24 MED ORDER — ALUM & MAG HYDROXIDE-SIMETH 200-200-20 MG/5ML PO SUSP: 30.0000 mL | Freq: Four times a day (QID) | ORAL | Status: DC | PRN

## 2013-05-24 MED ORDER — SORBITOL 70 % SOLN
30.0000 mL | Freq: Every day | Status: DC | PRN
  Filled 2013-05-24: qty 30

## 2013-05-24 MED ORDER — ACETAMINOPHEN 650 MG RE SUPP: 650.0000 mg | Freq: Four times a day (QID) | RECTAL | Status: DC | PRN

## 2013-05-24 MED ORDER — OMEGA-3 FATTY ACIDS 1000 MG PO CAPS: 1.0000 | ORAL_CAPSULE | Freq: Every day | ORAL | Status: DC

## 2013-05-24 NOTE — H&P (Signed)
[facility] History and Physical  Savon Cobbs Sterne ZOX:[id number] DOB: 06-Aug-1930 DOA: 05/24/2013  Referring physician: Dr Jodi Mourning  PCP: Ginette Otto, MD  Specialists: Cardiology: [facility] cardiology.  Chief Complaint: " I passed out. "  HPI: Laurie Klein is a 79 y.o. female  With history of anxiety, hypothyroidism, hyperlipidemia, MVP, PAC who presents to ED with a syncopal episode. Patient states 2 weeks ago she fell when she passed out on the way to the bathroom at night with LOC.. Patient states she felt dizzy/woozy prior to her fall/syncope. Patient states saw PCP the next day and labs done which were unremarkable. Patient states felt better. On day of admission patient states felt warm with some diaphoresis. She sat down and then passed out. Patient states woke up feeling weak and crawled to bedroom and pushed alarm. Personal came by called EMS and patient brought to ED. Patient denies fever, no CP, no SOB, no palpitations, no chills, no diarrhea, no constipation, no dysuria, no tongue biting, no jerking, no cough, no slurred speech, no dysuria, no bowel or bladder incontinence. Patient seen in ED and labs obtained with nl BMET, CBC with leukocytosis. UA turbid. CT head neg. CXR negative. EKG with NSR with PACs. Orthostatics negative.  Review of Systems: The patient denies anorexia, fever, weight loss,, vision loss, decreased hearing, hoarseness, chest pain, syncope, dyspnea on exertion, peripheral edema, balance deficits, hemoptysis, abdominal pain, melena, hematochezia, severe indigestion/heartburn, hematuria, incontinence, genital sores, muscle weakness, suspicious skin lesions, transient blindness, difficulty walking, depression, unusual weight change, abnormal bleeding, enlarged lymph nodes, angioedema, and breast masses.    Past Medical History  Diagnosis Date  . Degenerative disc disease   . Anxiety   . Spinal stenosis   . Mitral valve prolapse   . Dyslipidemia   .  Hypothyroidism   . TMJ syndrome   . Chronic headaches     history of  . Heart palpitations   . PAC (premature atrial contraction)   . MVP (mitral valve prolapse) 05/24/2013  . Headache(784.0): chronic 05/24/2013  . DDD (degenerative disc disease) 05/24/2013   Past Surgical History  Procedure Laterality Date  . Tonsillectomy    . Appendectomy    . Breast biopsy  3 times  . Disectomy      micro lumbar  . Cholecystectomy     Social History:  reports that she quit smoking about 39 years ago. Her smoking use included Cigarettes. She smoked 0.50 packs per day. She has never used smokeless tobacco. She reports that she drinks about 1.5 ounces of alcohol per week. She reports that she does not use illicit drugs.  Allergies  Allergen Reactions  . Acrylic Polymer   . Lipitor [Atorvastatin Calcium] Other (See Comments)    myalgias  . Orudis Kt   . Wax     Family History  Problem Relation Age of Onset  . Heart disease Father   . Heart failure Mother   . Stroke Mother   . Melanoma Child     Prior to Admission medications   Medication Sig Start Date End Date Taking? Authorizing Provider  Ascorbic Acid (VITAMIN C PO) Take by mouth.   Yes Historical Provider, MD  aspirin 81 MG tablet Take 81 mg by mouth daily.   Yes Historical Provider, MD  Calcium Citrate (CITRACAL PO) Take by mouth.   Yes Historical Provider, MD  cholecalciferol (VITAMIN D) 1000 UNITS tablet Take 2,000 Units by mouth daily.   Yes Historical Provider, MD  co-enzyme Q-10  30 MG capsule Take 100 mg by mouth 2 (two) times daily.   Yes Historical Provider, MD  fish oil-omega-3 fatty acids 1000 MG capsule Take 1 capsule by mouth daily.   Yes Historical Provider, MD  Flaxseed, Linseed, (FLAX SEED OIL PO) Take 45 mLs by mouth daily.   Yes Historical Provider, MD  levothyroxine (SYNTHROID, LEVOTHROID) 88 MCG tablet Take 88 mcg by mouth daily.   Yes Historical Provider, MD  Multiple Vitamin (MULTIVITAMIN) capsule Take 1  capsule by mouth daily.   Yes Historical Provider, MD  simvastatin (ZOCOR) 20 MG tablet Take 20 mg by mouth every evening.   Yes Historical Provider, MD  Specialty Vitamins Products (MAGNESIUM, AMINO ACID CHELATE,) 133 MG tablet Take 1 tablet by mouth every other day.   Yes Historical Provider, MD   Physical Exam: Filed Vitals:   05/24/13 1006  BP: 150/70  Pulse: 78  Temp:   Resp:      General:  NAD  Eyes: PERRLA. EOMI.  ENT: oropharynx, clear no lesions, no exudates.  Neck: Supple no LAD.  Cardiovascular: RRR with 2-3/6 SEM  Respiratory: CTAB. No wheezing, no rhonchi.  Abdomen: Soft/NT/ND/+BS  Skin: No rashes, no lesions.  Musculoskeletal: No c/c/e  Psychiatric: Nl mood, nl affect. Good insight, good judgement.  Neurologic: Alert and oriented x 3. CN 2-12 grossly intact. No focal deficits. Sensation intact. Visual fields intact.   Labs on Admission:  Basic Metabolic Panel:  Recent Labs Lab 05/24/13 1035  NA 135  K 3.9  CL 98  CO2 27  GLUCOSE 142*  BUN 10  CREATININE 0.67  CALCIUM 9.9   Liver Function Tests: No results found for this basename: AST, ALT, ALKPHOS, BILITOT, PROT, ALBUMIN,  in the last 168 hours No results found for this basename: LIPASE, AMYLASE,  in the last 168 hours No results found for this basename: AMMONIA,  in the last 168 hours CBC:  Recent Labs Lab 05/24/13 1035  WBC 11.6*  NEUTROABS 9.6*  HGB 13.9  HCT 41.2  MCV 91.8  PLT 347   Cardiac Enzymes:  Recent Labs Lab 05/24/13 1035  TROPONINI <0.30    BNP (last 3 results) No results found for this basename: PROBNP,  in the last 8760 hours CBG: No results found for this basename: GLUCAP,  in the last 168 hours  Radiological Exams on Admission: Dg Chest 2 View  05/24/2013   CLINICAL DATA:  Cough.  EXAM: CHEST  2 VIEW  COMPARISON:  11/11/2009.  FINDINGS: Mediastinum and hilar structures are normal. Mild apical stable interstitial thickening and pleural thickening noted  consistent with scarring. No focal infiltrate. No evidence of pleural effusion or pneumothorax. Heart size and pulmonary vascularity normal. No acute osseous abnormality. Calcified nodule in the left breast. This is most likely calcification of fibroadenoma. Calcification of the left upper quadrant most likely benign calcification in the spleen.  IMPRESSION: No active cardiopulmonary disease.  Stable chest from 11/11/2009.   Electronically Signed   By: Maisie Fus  Register   On: 05/24/2013 10:27   Ct Head Wo Contrast  05/24/2013   CLINICAL DATA:  Fall.  Head injury  EXAM: CT HEAD WITHOUT CONTRAST  TECHNIQUE: Contiguous axial images were obtained from the base of the skull through the vertex without intravenous contrast.  COMPARISON:  None.  FINDINGS: Mild atrophy and mild chronic microvascular ischemic change in the white matter.  Negative for acute infarct, hemorrhage, mass. Negative for skull fracture. Intracranial atherosclerotic disease is noted.  IMPRESSION: No acute  abnormality.   Electronically Signed   By: Marlan Palau M.D.   On: 05/24/2013 11:00    EKG: Independently reviewed. NSR with PAC  Assessment/Plan Principal Problem:   Syncope and collapse Active Problems:   MVP (mitral valve prolapse)   Anxiety   Dyslipidemia   Hypothyroidism   Headache(784.0): chronic   PAC (premature atrial contraction)  # 1 Syncope and collapse ?? Etiology. Cardiogenic vs Neurological. Patient not orthostatic. CT head negative. Patient with recent 2 d echo with EF = 60-65%. NWMA. No significant valvular abnormalities noted. EKG done had normal sinus rhythm with PACs. We'll admit patient to telemetry. Get MRI of the head. Get carotid Dopplers. Check EEG. Get a d-dimer. If d-dimer is elevated we'll get a CT angiogram of the chest to rule out PE. Will consult with cardiology for further evaluation and management. IV fluids. Supportive care.  #2 history of mitral valve prolapse Stable. 2-D echo obtained  recently with EF of 60-65%. No wall motion abnormalities. follow.  #3 hypothyroidism Check a TSH. Continue home dose Synthroid.  #4 dyslipidemia Continue fish oil and statin.  #5 anxiety Stable.  #6 prophylaxis Lovenox for DVT prophylaxis.   Code Status: DNR Family Communication: updated patient, no family present. Disposition Plan: Admit to telemetry  Time spent: 26 MINS  [facility] Pager [phone]  If 7PM-7AM, please contact night-coverage [email or web address] Password [facility] 05/24/2013, 3:23 PM

## 2013-05-24 NOTE — ED Notes (Addendum)
Pt transported to xray 

## 2013-05-24 NOTE — Consult Note (Addendum)
Name: Laurie Klein is a 79 y.o. female Admit date: 05/24/2013 Referring Physician: Ramiro Harvest, M.D. Primary Physician:  Merlene Laughter, M.D. Primary Cardiologist:  Merilyn Baba, M.D.  Reason for Consultation:  Syncope  ASSESSMENT: 1. Syncope with prodrome, suggests a neurally mediated mechanism (vasovagal ). An episode 2 weeks ago caused traumatic injury to the face and head . 2. History of mitral valve prolapse 3. Anxiety disorder 4. Hyperlipidemia 5. Degenerative disc disease 6. Spinal stenosis  PLAN:  1. Continuous monitoring while in hospital 2. Consider ambulatory 30 day continuous monitor 3. Will review echocardiogram and other cardiac data 4. Orthostatic blood pressure recordings    HPI: 79 year old female with 2 recent fainting episodes. She has had multiple other fainting episodes earlier in life. There was a cluster of approximately 4 episodes greater than 20 years ago when she was on medication to treat headache. On the 2 most recent medications the patient has arisen from a recumbent or sitting position. Today she began to feel weak and had the sense that she might faint. She sat down and he eventually she did. She denies palpitations. There was no chest pain no dyspnea. 2 weeks ago she got up to the bathroom in the middle of the night. She may then to the bathroom, began to feel weak and almost immediately fainted. Again there was no significant other complaints such as chest pain, nausea, headache, palpitation, etc. appear  PMH:   Past Medical History  Diagnosis Date  . Degenerative disc disease   . Anxiety   . Spinal stenosis   . Mitral valve prolapse   . Dyslipidemia   . Hypothyroidism   . TMJ syndrome   . Chronic headaches     history of  . Heart palpitations   . PAC (premature atrial contraction)   . MVP (mitral valve prolapse) 05/24/2013  . Headache(784.0): chronic 05/24/2013  . DDD (degenerative disc disease) 05/24/2013    PSH:   Past  Surgical History  Procedure Laterality Date  . Tonsillectomy    . Appendectomy    . Breast biopsy  3 times  . Disectomy      micro lumbar  . Cholecystectomy     Allergies:  Acrylic polymer; Lipitor; Orudis kt; and Wax Prior to Admit Meds:   Prescriptions prior to admission  Medication Sig Dispense Refill  . Ascorbic Acid (VITAMIN C PO) Take by mouth.      Marland Kitchen aspirin 81 MG tablet Take 81 mg by mouth daily.      . Calcium Citrate (CITRACAL PO) Take by mouth.      . cholecalciferol (VITAMIN D) 1000 UNITS tablet Take 2,000 Units by mouth daily.      Marland Kitchen co-enzyme Q-10 30 MG capsule Take 100 mg by mouth 2 (two) times daily.      . fish oil-omega-3 fatty acids 1000 MG capsule Take 1 capsule by mouth daily.      . Flaxseed, Linseed, (FLAX SEED OIL PO) Take 45 mLs by mouth daily.      Marland Kitchen levothyroxine (SYNTHROID, LEVOTHROID) 88 MCG tablet Take 88 mcg by mouth daily.      . Multiple Vitamin (MULTIVITAMIN) capsule Take 1 capsule by mouth daily.      . simvastatin (ZOCOR) 20 MG tablet Take 20 mg by mouth every evening.      Marland Kitchen Specialty Vitamins Products (MAGNESIUM, AMINO ACID CHELATE,) 133 MG tablet Take 1 tablet by mouth every other day.  Fam HX:    Family History  Problem Relation Age of Onset  . Heart disease Father   . Heart failure Mother   . Stroke Mother   . Melanoma Child    Social HX:    History   Social History  . Marital Status: Married    Spouse Name: N/A    Number of Children: N/A  . Years of Education: N/A   Occupational History  . retired [employer] operational officer    Social History Main Topics  . Smoking status: Former Smoker -- 0.50 packs/day    Types: Cigarettes    Quit date: 10/03/1973  . Smokeless tobacco: Never Used  . Alcohol Use: 1.5 oz/week    3 drink(s) per week     Comment: 4 glasses wine per week  . Drug Use: No  . Sexual Activity: Not on file   Other Topics Concern  . Not on file   Social History Narrative  . No narrative on file     Review of  Systems: No history of stroke. No family history of stroke or sudden death. She denies edema. Orthopnea. PND. No history of coronary disease. Prior smoker but stopped greater than 40 years ago.  Physical Exam: Blood pressure 132/63, pulse 74, temperature 97.5 F (36.4 C), temperature source Oral, resp. rate 18, height 5\' 4"  (1.626 m), weight 122 lb (55.339 kg), SpO2 100.00%. Weight change:    Ecchymosis on the left eye. Otherwise HEENT exam is unremarkable The neck exam reveals no JVD, thyromegaly, or carotid bruits The chest is clear to auscultation and percussion Cardiac exam reveals a soft late systolic click and a soft late systolic murmur. An S4 gallop is audible. Abdomen is soft. Bowel sounds are normal. No abnormal pulsations are noted. Extremities reveal no edema. Pulses are 2+ and symmetric in the upper and lower extremities to The neurological exam is unremarkable Labs: Lab Results  Component Value Date   WBC 11.1* 05/24/2013   HGB 13.9 05/24/2013   HCT 41.3 05/24/2013   MCV 91.0 05/24/2013   PLT 353 05/24/2013    Recent Labs Lab 05/24/13 1035 05/24/13 1601  NA 135  --   K 3.9  --   CL 98  --   CO2 27  --   BUN 10  --   CREATININE 0.67 0.66  CALCIUM 9.9  --   GLUCOSE 142*  --    Lab Results  Component Value Date   TROPONINI <0.30 05/24/2013         Radiology:  Dg Chest 2 View  05/24/2013   CLINICAL DATA:  Cough.  EXAM: CHEST  2 VIEW  COMPARISON:  11/11/2009.  FINDINGS: Mediastinum and hilar structures are normal. Mild apical stable interstitial thickening and pleural thickening noted consistent with scarring. No focal infiltrate. No evidence of pleural effusion or pneumothorax. Heart size and pulmonary vascularity normal. No acute osseous abnormality. Calcified nodule in the left breast. This is most likely calcification of fibroadenoma. Calcification of the left upper quadrant most likely benign calcification in the spleen.  IMPRESSION: No active  cardiopulmonary disease.  Stable chest from 11/11/2009.   Electronically Signed   By: Maisie Fus  Register   On: 05/24/2013 10:27   Ct Head Wo Contrast  05/24/2013   CLINICAL DATA:  Fall.  Head injury  EXAM: CT HEAD WITHOUT CONTRAST  TECHNIQUE: Contiguous axial images were obtained from the base of the skull through the vertex without intravenous contrast.  COMPARISON:  None.  FINDINGS: Mild atrophy and  mild chronic microvascular ischemic change in the white matter.  Negative for acute infarct, hemorrhage, mass. Negative for skull fracture. Intracranial atherosclerotic disease is noted.  IMPRESSION: No acute abnormality.   Electronically Signed   By: Marlan Palau M.D.   On: 05/24/2013 11:00   Mr Laqueta Jean ZO Contrast  05/24/2013   CLINICAL DATA:  Fall 2 weeks ago hitting back of head, dizziness ever since.  EXAM: MRI HEAD WITHOUT AND WITH CONTRAST  TECHNIQUE: Multiplanar, multiecho pulse sequences of the brain and surrounding structures were obtained without and with intravenous contrast.  CONTRAST:  10mL MULTIHANCE GADOBENATE DIMEGLUMINE 529 MG/ML IV SOLN  COMPARISON:  05/24/2013 CT.  No comparison MR.  FINDINGS: No acute infarct.  No intracranial hemorrhage.  Mild small vessel disease type changes.  Mild atrophy without hydrocephalus.  No intracranial mass or abnormal enhancement.  Major intracranial vascular structures are patent.  Cervical medullary junction, pituitary region, pineal region and orbital structures unremarkable.  IMPRESSION: No acute abnormality.  Please see above.   Electronically Signed   By: Bridgett Larsson M.D.   On: 05/24/2013 19:39   EKG:  Normal  ECHOCARDIOGRAM: 05/21/13 Study Conclusions  Left ventricle: The cavity size was normal. Systolic function was normal. The estimated ejection fraction was in the range of 60% to 65%. Wall motion was normal; there were no regional wall motion abnormalities.   Lesleigh Noe 05/24/2013 7:43 PM

## 2013-05-24 NOTE — ED Notes (Signed)
MD at bedside. 

## 2013-05-24 NOTE — ED Notes (Signed)
Bed: DG38 Expected date:  Expected time:  Means of arrival:  Comments: EMS-syncopal episode-fall last week

## 2013-05-24 NOTE — Progress Notes (Signed)
Utilization Review completed.  Emauri Krygier RN CM  

## 2013-05-24 NOTE — ED Notes (Signed)
Per EMS-states she fell and hit back of head in bathroom 2 weeks ago, was not treated-one week ago has a nosebleed-this am passed out in chair was feeling light headed when aroused-placed on floor by staff and felt better when laying flat

## 2013-05-24 NOTE — ED Notes (Signed)
Pt transported to CT ?

## 2013-05-24 NOTE — ED Provider Notes (Signed)
CSN: [id number]     Arrival date & time 05/24/13  0941 History   First MD Initiated Contact with Patient 05/24/13 0945     Chief Complaint  Patient presents with  . snycopal episode    (Consider location/radiation/quality/duration/timing/severity/associated sxs/prior Treatment) HPI Comments: 79 yo female with past smoking hx years ago, occasional etoh, MVP hx presents with syncope.  Pt has had 3 episodes the past week, twice while walking, another while sitting up.  Second time had head injury, no CT had been done as pt was doing okay.  No cp or sob.  Pt had lightheaded/ nausea prior to today PTA, sat down prior to syncope.  No bleeding.  No cad hx known. Recent echo done.  The history is provided by the patient.    Past Medical History  Diagnosis Date  . Degenerative disc disease   . Anxiety   . Spinal stenosis   . Mitral valve prolapse   . Dyslipidemia   . Hypothyroidism   . TMJ syndrome   . Chronic headaches     history of  . Heart palpitations   . PAC (premature atrial contraction)    Past Surgical History  Procedure Laterality Date  . Tonsillectomy    . Appendectomy    . Breast biopsy  3 times  . Disectomy      micro lumbar   Family History  Problem Relation Age of Onset  . Heart disease Father   . Heart failure Mother   . Stroke Mother   . Melanoma Child    History  Substance Use Topics  . Smoking status: Former Smoker -- 0.50 packs/day    Types: Cigarettes    Quit date: 10/03/1973  . Smokeless tobacco: Not on file  . Alcohol Use: 1.5 oz/week    3 drink(s) per week   OB History   Grav Para Term Preterm Abortions TAB SAB Ect Mult Living                 Review of Systems  Constitutional: Positive for appetite change. Negative for fever and chills.  HENT: Negative for congestion.   Eyes: Negative for visual disturbance.  Respiratory: Negative for shortness of breath.   Cardiovascular: Negative for chest pain.  Gastrointestinal: Positive for nausea.  Negative for vomiting, abdominal pain and blood in stool.  Genitourinary: Negative for dysuria and flank pain.  Musculoskeletal: Negative for back pain, neck pain and neck stiffness.  Skin: Negative for rash.  Neurological: Positive for syncope, light-headedness and headaches.    Allergies  Acrylic polymer; Lipitor; Orudis kt; and Wax  Home Medications   Current Outpatient Rx  Name  Route  Sig  Dispense  Refill  . Ascorbic Acid (VITAMIN C PO)   Oral   Take by mouth.         Marland Kitchen aspirin 81 MG tablet   Oral   Take 81 mg by mouth daily.         . Calcium Citrate (CITRACAL PO)   Oral   Take by mouth.         . cholecalciferol (VITAMIN D) 1000 UNITS tablet   Oral   Take 2,000 Units by mouth daily.         Marland Kitchen co-enzyme Q-10 30 MG capsule   Oral   Take 100 mg by mouth 2 (two) times daily.         . fish oil-omega-3 fatty acids 1000 MG capsule   Oral   Take 1 capsule  by mouth daily.         . Flaxseed, Linseed, (FLAX SEED OIL PO)   Oral   Take 45 mLs by mouth daily.         Marland Kitchen levothyroxine (SYNTHROID, LEVOTHROID) 88 MCG tablet   Oral   Take 88 mcg by mouth daily.         . Multiple Vitamin (MULTIVITAMIN) capsule   Oral   Take 1 capsule by mouth daily.         . simvastatin (ZOCOR) 20 MG tablet   Oral   Take 20 mg by mouth every evening.         Marland Kitchen Specialty Vitamins Products (MAGNESIUM, AMINO ACID CHELATE,) 133 MG tablet   Oral   Take 1 tablet by mouth every other day.          BP 127/60  Pulse 68  Temp(Src) 97.7 F (36.5 C) (Oral)  Resp 18  SpO2 100% Physical Exam  Nursing note and vitals reviewed. Constitutional: She is oriented to person, place, and time. She appears well-developed and well-nourished.  HENT:  Head: Normocephalic and atraumatic.  Mild dry mm  Eyes: Conjunctivae are normal. Right eye exhibits no discharge. Left eye exhibits no discharge.  Neck: Normal range of motion. Neck supple. No tracheal deviation present.   Cardiovascular: Normal rate and regular rhythm.   Murmur (left lower sternum MVP) heard. Pulmonary/Chest: Effort normal and breath sounds normal.  Abdominal: Soft. She exhibits no distension. There is no tenderness. There is no guarding.  Musculoskeletal: She exhibits tenderness (very mild posterior scalp). She exhibits no edema.  Neurological: She is alert and oriented to person, place, and time. No cranial nerve deficit.  Skin: Skin is warm. Rash (ecchymosis left maxillary region, old, no neck pain) noted.  Psychiatric: She has a normal mood and affect.    ED Course  Procedures (including critical care time) Labs Review Labs Reviewed  CBC WITH DIFFERENTIAL - Abnormal; Notable for the following:    WBC 11.6 (*)    Neutrophils Relative % 83 (*)    Neutro Abs 9.6 (*)    All other components within normal limits  BASIC METABOLIC PANEL - Abnormal; Notable for the following:    Glucose, Bld 142 (*)    GFR calc non Af Amer 81 (*)    All other components within normal limits  URINALYSIS, ROUTINE W REFLEX MICROSCOPIC - Abnormal; Notable for the following:    APPearance TURBID (*)    Protein, ur 100 (*)    All other components within normal limits  URINE MICROSCOPIC-ADD ON - Abnormal; Notable for the following:    Bacteria, UA MANY (*)    All other components within normal limits  CBC - Abnormal; Notable for the following:    WBC 11.1 (*)    All other components within normal limits  CREATININE, SERUM - Abnormal; Notable for the following:    GFR calc non Af Amer 81 (*)    All other components within normal limits  URINE CULTURE  URINE CULTURE  TROPONIN I  D-DIMER, QUANTITATIVE  MAGNESIUM  TROPONIN I  TSH  TROPONIN I  TROPONIN I  URINALYSIS, ROUTINE W REFLEX MICROSCOPIC  BASIC METABOLIC PANEL  CBC  PROTIME-INR  APTT   Imaging Review Dg Chest 2 View  05/24/2013   CLINICAL DATA:  Cough.  EXAM: CHEST  2 VIEW  COMPARISON:  11/11/2009.  FINDINGS: Mediastinum and hilar  structures are normal. Mild apical stable interstitial thickening and pleural  thickening noted consistent with scarring. No focal infiltrate. No evidence of pleural effusion or pneumothorax. Heart size and pulmonary vascularity normal. No acute osseous abnormality. Calcified nodule in the left breast. This is most likely calcification of fibroadenoma. Calcification of the left upper quadrant most likely benign calcification in the spleen.  IMPRESSION: No active cardiopulmonary disease.  Stable chest from 11/11/2009.   Electronically Signed   By: Maisie Fus  Register   On: 05/24/2013 10:27   Ct Head Wo Contrast  05/24/2013   CLINICAL DATA:  Fall.  Head injury  EXAM: CT HEAD WITHOUT CONTRAST  TECHNIQUE: Contiguous axial images were obtained from the base of the skull through the vertex without intravenous contrast.  COMPARISON:  None.  FINDINGS: Mild atrophy and mild chronic microvascular ischemic change in the white matter.  Negative for acute infarct, hemorrhage, mass. Negative for skull fracture. Intracranial atherosclerotic disease is noted.  IMPRESSION: No acute abnormality.   Electronically Signed   By: Marlan Palau M.D.   On: 05/24/2013 11:00    EKG Interpretation    Date/Time:  Thursday May 24 2013 09:54:41 EST Ventricular Rate:  78 PR Interval:  158 QRS Duration: 84 QT Interval:  405 QTC Calculation: 461 R Axis:   -7 Text Interpretation:  Sinus rhythm Atrial premature complex No acute findings Confirmed by Thayer Inabinet  MD, Carrigan Delafuente ([id number]) on 05/24/2013 6:02:38 PM            MDM   1. Syncope and collapse   2. Nausea   3. Dyslipidemia   4. Hypothyroidism   5. MVP (mitral valve prolapse)    Concern for orthostatic vs arrhythmia/ tachy brady. Recent US reviewed, no structural issues.  With recurrent syncope and age discussed with hospitalist, accepted for tele observation. ?tachy-brady vs orthostatic vs other.   Urine culture sent. Rechecked, mild lightheaded.  The patients  results and plan were reviewed and discussed.   Any x-rays performed were personally reviewed by myself.   Differential diagnosis were considered with the presenting HPI.  Diagnosis: Syncope  EKG: reviewed  Admission/ observation were discussed with the admitting physician, patient and/or family and they are comfortable with the plan.    Enid Skeens, MD 05/24/13 [phone]

## 2013-05-25 ENCOUNTER — Other Ambulatory Visit (HOSPITAL_COMMUNITY): Payer: Medicare Other

## 2013-05-25 DIAGNOSIS — I059 Rheumatic mitral valve disease, unspecified: Secondary | ICD-10-CM | POA: Diagnosis not present

## 2013-05-25 DIAGNOSIS — R55 Syncope and collapse: Secondary | ICD-10-CM

## 2013-05-25 LAB — URINE CULTURE: Colony Count: NO GROWTH

## 2013-05-25 LAB — CBC
HCT: 36.5 % (ref 36.0–46.0)
Hemoglobin: 12.3 g/dL (ref 12.0–15.0)
MCH: 30.7 pg (ref 26.0–34.0)
MCV: 91 fL (ref 78.0–100.0)
Platelets: 293 10*3/uL (ref 150–400)
RBC: 4.01 MIL/uL (ref 3.87–5.11)
RDW: 14.2 % (ref 11.5–15.5)
WBC: 9.3 10*3/uL (ref 4.0–10.5)

## 2013-05-25 LAB — BASIC METABOLIC PANEL
BUN: 14 mg/dL (ref 6–23)
CO2: 24 mEq/L (ref 19–32)
Calcium: 9 mg/dL (ref 8.4–10.5)
Chloride: 104 mEq/L (ref 96–112)
GFR calc Af Amer: >90 mL/min (ref >90)
GFR calc non Af Amer: 79 mL/min — ABNORMAL LOW (ref >90)
Potassium: 3.9 mEq/L (ref 3.5–5.1)
Sodium: 136 mEq/L (ref 135–145)

## 2013-05-25 LAB — APTT: aPTT: 37 seconds (ref 24–37)

## 2013-05-25 MED ORDER — ASPIRIN 81 MG PO CHEW
81.0000 mg | CHEWABLE_TABLET | Freq: Every day | ORAL | Status: DC
  Administered 2013-05-25 – 2013-05-26 (×2): 81 mg via ORAL
  Filled 2013-05-25 (×2): qty 1

## 2013-05-25 MED ORDER — SODIUM CHLORIDE 0.9 % IJ SOLN: 3.0000 mL | INTRAMUSCULAR | Status: DC | PRN

## 2013-05-25 MED ORDER — SODIUM CHLORIDE 0.9 % IJ SOLN
3.0000 mL | Freq: Two times a day (BID) | INTRAMUSCULAR | Status: DC
  Administered 2013-05-25: 22:00:00 3 mL via INTRAVENOUS

## 2013-05-25 MED ORDER — SODIUM CHLORIDE 0.9 % IV SOLN: 250.0000 mL | INTRAVENOUS | Status: DC | PRN

## 2013-05-25 NOTE — Progress Notes (Signed)
Data base reviewed. Nothing new since last PM. Monitor is benign. Orthostatic vital signs not yet recorded.

## 2013-05-25 NOTE — Progress Notes (Signed)
[facility] PROGRESS NOTE  Laurie Klein ZOX:[id number] DOB: [birth year] DOA: 05/24/2013  PCP: Ginette Otto, MD  Brief HPI: Laurie Klein is a 79 y.o. female with history of anxiety, hypothyroidism, hyperlipidemia, MVP, PAC who presents to ED with a syncopal episode. Patient states 2 weeks ago she fell when she passed out on the way to the bathroom at night with LOC.. Patient stated she felt dizzy/woozy prior to her fall/syncope. Patient stated she saw PCP the next day and labs done which were unremarkable. On day of admission patient felt warm with some diaphoresis. She sat down and then passed out.   Past medical history:  Past Medical History  Diagnosis Date  . Degenerative disc disease   . Anxiety   . Spinal stenosis   . Mitral valve prolapse   . Dyslipidemia   . Hypothyroidism   . TMJ syndrome   . Chronic headaches     history of  . Heart palpitations   . PAC (premature atrial contraction)   . MVP (mitral valve prolapse) 05/24/2013  . Headache(784.0): chronic 05/24/2013  . DDD (degenerative disc disease) 05/24/2013    Consultants: Cardiology  Procedures:  Carotid Dopplers: No significant stenosis  ECHO 11/17 Left ventricle: The cavity size was normal. Systolic function was normal. The estimated ejection fraction was in the range of 60% to 65%. Wall motion was normal; there were no regional wall motion abnormalities.  Antibiotics: None  Subjective: Patient feels well. Denies any chest pain or shortness of breath. No further syncopal episodes.  Objective: Vital Signs  Filed Vitals:   05/25/13 0500 05/25/13 0600 05/25/13 0603 05/25/13 0607  BP:  112/56 120/58 133/63  Pulse:  62 69 78  Temp:  98.7 F (37.1 C)    TempSrc:  Oral    Resp:  16    Height:      Weight: 55.838 kg (123 lb 1.6 oz)     SpO2:  97%      Filed Weights   05/24/13 1530 05/25/13 0500  Weight: 55.339 kg (122 lb) 55.838 kg (123 lb 1.6 oz)    Intake/Output from previous  day: 11/20 0701 - 11/21 0700 In: 681.3 [P.O.:480; I.V.:201.3] Out: 1025 [Urine:1025]  General appearance: alert, cooperative, appears stated age and no distress Resp: clear to auscultation bilaterally Cardio: regular rate and rhythm, S1, S2 normal, no murmur, click, rub or gallop GI: soft, non-tender; bowel sounds normal; no masses,  no organomegaly Extremities: extremities normal, atraumatic, no cyanosis or edema Neurologic: Alert and oriented x 3. No focal deficits.  Lab Results:  Basic Metabolic Panel:  Recent Labs Lab 05/24/13 1035 05/24/13 1601 05/25/13 0350  NA 135  --  136  K 3.9  --  3.9  CL 98  --  104  CO2 27  --  24  GLUCOSE 142*  --  90  BUN 10  --  14  CREATININE 0.67 0.66 0.73  CALCIUM 9.9  --  9.0  MG  --  2.2  --    CBC:  Recent Labs Lab 05/24/13 1035 05/24/13 1601 05/25/13 0350  WBC 11.6* 11.1* 9.3  NEUTROABS 9.6*  --   --   HGB 13.9 13.9 12.3  HCT 41.2 41.3 36.5  MCV 91.8 91.0 91.0  PLT 347 353 293   Cardiac Enzymes:  Recent Labs Lab 05/24/13 1035 05/24/13 1601 05/24/13 2145 05/25/13 0350  TROPONINI <0.30 <0.30 <0.30 <0.30   CBG:  Recent Labs Lab 05/25/13 0748  GLUCAP 87  No results found for this or any previous visit (from the past 240 hour(s)).    Studies/Results: Dg Chest 2 View  05/24/2013   CLINICAL DATA:  Cough.  EXAM: CHEST  2 VIEW  COMPARISON:  11/11/2009.  FINDINGS: Mediastinum and hilar structures are normal. Mild apical stable interstitial thickening and pleural thickening noted consistent with scarring. No focal infiltrate. No evidence of pleural effusion or pneumothorax. Heart size and pulmonary vascularity normal. No acute osseous abnormality. Calcified nodule in the left breast. This is most likely calcification of fibroadenoma. Calcification of the left upper quadrant most likely benign calcification in the spleen.  IMPRESSION: No active cardiopulmonary disease.  Stable chest from 11/11/2009.   Electronically  Signed   By: Maisie Fus  Register   On: 05/24/2013 10:27   Ct Head Wo Contrast  05/24/2013   CLINICAL DATA:  Fall.  Head injury  EXAM: CT HEAD WITHOUT CONTRAST  TECHNIQUE: Contiguous axial images were obtained from the base of the skull through the vertex without intravenous contrast.  COMPARISON:  None.  FINDINGS: Mild atrophy and mild chronic microvascular ischemic change in the white matter.  Negative for acute infarct, hemorrhage, mass. Negative for skull fracture. Intracranial atherosclerotic disease is noted.  IMPRESSION: No acute abnormality.   Electronically Signed   By: Marlan Palau M.D.   On: 05/24/2013 11:00   Mr Laqueta Jean XB Contrast  05/24/2013   CLINICAL DATA:  Fall 2 weeks ago hitting back of head, dizziness ever since.  EXAM: MRI HEAD WITHOUT AND WITH CONTRAST  TECHNIQUE: Multiplanar, multiecho pulse sequences of the brain and surrounding structures were obtained without and with intravenous contrast.  CONTRAST:  10mL MULTIHANCE GADOBENATE DIMEGLUMINE 529 MG/ML IV SOLN  COMPARISON:  05/24/2013 CT.  No comparison MR.  FINDINGS: No acute infarct.  No intracranial hemorrhage.  Mild small vessel disease type changes.  Mild atrophy without hydrocephalus.  No intracranial mass or abnormal enhancement.  Major intracranial vascular structures are patent.  Cervical medullary junction, pituitary region, pineal region and orbital structures unremarkable.  IMPRESSION: No acute abnormality.  Please see above.   Electronically Signed   By: Bridgett Larsson M.D.   On: 05/24/2013 19:39    Medications:  Scheduled: . aspirin  81 mg Oral Daily  . enoxaparin (LOVENOX) injection  40 mg Subcutaneous Q24H  . levothyroxine  88 mcg Oral QAC breakfast  . multivitamin with minerals  1 tablet Oral Daily  . omega-3 acid ethyl esters  1 g Oral Daily  . simvastatin  20 mg Oral QPM  . sodium chloride  3 mL Intravenous Q12H   Continuous: . sodium chloride 75 mL/hr at 05/25/13 2841   LKG:[id number], acetaminophen,  alum & mag hydroxide-simeth, ondansetron (ZOFRAN) IV, ondansetron, polyethylene glycol, sorbitol  Assessment/Plan:  Principal Problem:   Syncope and collapse Active Problems:   MVP (mitral valve prolapse)   Anxiety   Dyslipidemia   Hypothyroidism   Headache(784.0): chronic   PAC (premature atrial contraction)    Syncope and collapse  Etiology  Remains unclear. No arrythmia on monitor. Patient not orthostatic. CT head and MRI negative. Patient with recent 2 d echo with EF = 60-65% with normal wall motion. No significant valvular abnormalities noted. EKG done had normal sinus rhythm with PACs. D dimer was normal. She has ruled out. Cardiology is following. EEG is pending though may not be done today as apparently the machine is broken. Carotid dopplers shows no significant stenoses. Stop IVF. Mobilize. PT/OT.  History of mitral valve prolapse  Stable. 2-D echo obtained recently with EF of 60-65%. No wall motion abnormalities.  Hypothyroidism  Continue home dose Synthroid. TSH normal.  Dyslipidemia  Continue fish oil and statin.   History of Anxiety  Stable.  Code Status: Confirmed with patient: She is DNR  DVT Prophylaxis: Lovenox    Family Communication: Discussed with patient.  Disposition Plan: Not ready for discharge. Lives at independent living facility.    LOS: 1 day   [facility]  [facility] Pager [phone] 05/25/2013, 11:24 AM  If 8PM-8AM, please contact night-coverage at [email or web address], password [facility]

## 2013-05-25 NOTE — Progress Notes (Signed)
Orthostatic VS from 0600 were as follows:   Lying: BP 112/56, Pulse 62 Sitting: BP 120/58, Pulse 69 Standing: BP 133/63, Pulse 78

## 2013-05-25 NOTE — Evaluation (Addendum)
Physical Therapy Evaluation Patient Details Name: Laurie Klein MRN: [id number] DOB: [birth year] Today's Date: 05/25/2013 Time: 4098-1191 PT Time Calculation (min): 23 min  PT Assessment / Plan / Recommendation History of Present Illness  79 yo female admitted with syncope, collapse. Hx of neuritis (per pt)  Clinical Impression  On eval, pt required supervision level assist (for safety reasons only due to recent hx of syncope) for mobility-able to ambulate ~1000 feet (2 laps around unit) this session-no signs, symptoms of syncope-pt denies dizziness or any of symptoms that preceded her passing out prior to admission. Encouraged pt to ambulate again later today with nursing supervision/assist.  Addendum: Per OT vestibular evaluation/discussion (see OT note), recommend OP vestibular eval/tx.     PT Assessment  Patient needs continued PT services    Follow Up Recommendations  OP PT (vestibular rehab)    Does the patient have the potential to tolerate intense rehabilitation      Barriers to Discharge        Equipment Recommendations  None recommended by PT    Recommendations for Other Services     Frequency Min 3X/week    Precautions / Restrictions Precautions Precautions: Fall Precaution Comments: syncope Restrictions Weight Bearing Restrictions: No   Pertinent Vitals/Pain BP 153/52 midway through walk      Mobility  Bed Mobility Bed Mobility: Supine to Sit Supine to Sit: 7: Independent Transfers Transfers: Sit to Stand;Stand to Sit Sit to Stand: 7: Independent Stand to Sit: 7: Independent Ambulation/Gait Ambulation/Gait Assistance: 5: Supervision Ambulation Distance (Feet): 1000 Feet Assistive device: None Ambulation/Gait Assistance Details: supervision for safety reasons only. followed with a chair for safety as well. Had pt perform sit-to stand transition midway through walk, and head turns (up, down, left, right) while ambulating to assess if any for aggravating  factors.  Gait Pattern: Step-through pattern   Exercises     PT Diagnosis: Difficulty walking  PT Problem List: Decreased mobility PT Treatment Interventions: Gait training;Stair training;Functional mobility training;Patient/family education     PT Goals(Current goals can be found in the care plan section) Acute Rehab PT Goals Patient Stated Goal: mobilize with confidence. PT Goal Formulation: With patient Time For Goal Achievement: 06/01/13 Potential to Achieve Goals: Good  Visit Information  Last PT Received On: 05/25/13 Assistance Needed: +1 History of Present Illness: 79 yo female admitted with syncope, collapse. Hx of neuritis (per pt)       Prior Functioning  Home Living Family/patient expects to be discharged to:: Private residence Living Arrangements: Alone Type of Home: Apartment Home Access: Stairs to Biomedical scientist of Steps: 12 steps to enter building (pt normally takes steps) Home Layout: One level Home Equipment: None Prior Function Level of Independence: Independent Communication Communication: No difficulties    Cognition  Cognition Arousal/Alertness: Awake/alert Behavior During Therapy: WFL for tasks assessed/performed Overall Cognitive Status: Within Functional Limits for tasks assessed    Extremity/Trunk Assessment Upper Extremity Assessment Upper Extremity Assessment: Overall WFL for tasks assessed Lower Extremity Assessment Lower Extremity Assessment: Overall WFL for tasks assessed (although pt feels a litte "weak" from being in bed) Cervical / Trunk Assessment Cervical / Trunk Assessment: Normal   Balance Balance Balance Assessed: Yes Dynamic Standing Balance Dynamic Standing - Balance Support: No upper extremity supported Dynamic Standing - Level of Assistance: 5: Stand by assistance Dynamic Standing - Comments: had pt perform head turns (up, down, left, right) while ambulating. NO LOB or dizziness/lightheadedness   End of Session PT - End of Session Equipment  Utilized During Treatment: Gait belt Activity Tolerance: Patient tolerated treatment well Patient left: in chair;with call bell/phone within reach  GP     Rebeca Alert, MPT Pager: [phone]

## 2013-05-25 NOTE — Progress Notes (Signed)
Bilateral carotid artery duplex:  1-39% ICA stenosis.  Vertebral artery flow is antegrade.     

## 2013-05-25 NOTE — Evaluation (Signed)
Occupational Therapy Evaluation Patient Details Name: Laurie Klein MRN: [id number] DOB: 10/21/1930 Today's Date: 05/25/2013 Time: 7829-5621 OT Time Calculation (min): 40 min  OT Assessment / Plan / Recommendation History of present illness 79 yo female admitted with syncope, collapse. Hx of neuritis (per pt)   Clinical Impression   Pt was admitted for the above.  During evaluation, discovered that pt not only had syncopal episodes but had dizziness/spinning when getting OOB.  Tested for BPPV and pt symptomatic on R side.  Worked through modified epley maneuver (reverse trendelenberg) as pt has h/o c-disc herniation.  Gave her resource for OP vestibular program if problem persists.  We will follow her in acute.      OT Assessment  Patient needs continued OT Services    Follow Up Recommendations   (OP vesibular program if transportation/and if spinning remai)    Barriers to Discharge      Equipment Recommendations  None recommended by OT    Recommendations for Other Services    Frequency  Min 2X/week    Precautions / Restrictions Precautions Precautions: Fall Precaution Comments: syncope Restrictions Weight Bearing Restrictions: No   Pertinent Vitals/Pain No pain    ADL  Grooming: Supervision/safety Toilet Transfer: Radiographer, therapeutic Method: Sit to Barista: Comfort height toilet;Grab bars Toileting - Clothing Manipulation and Hygiene: Supervision/safety Where Assessed - Toileting Clothing Manipulation and Hygiene: Sit to stand from 3-in-1 or toilet Transfers/Ambulation Related to ADLs: pt tended to "furniture walk" to bathroom. ADL Comments: Pt is either set up or supervision level for adls to gather items.  she touched walls and furniture as she walked as she feels weak from being in bed.  when talking to pt about hx, she sometimes has dizziness/spinning when getting up from bed.  Tested for BPPV in mod hallpike dix.  Pt positive on  R side (kept eyes closed but eyelid fluttered).  Attempted tx with modified epley (bed in reverse trendelenberg) due to h/o cervical disc herniation.  PT will see her tomorrow and alternative tx would be Semont maneuver which could be done in sidelying Pt has emergency button she can wear around neck and call buttons in apt.  Only experienced dizziness during testing   OT Diagnosis: Generalized weakness (BPPV)  OT Problem List: Impaired balance (sitting and/or standing);Decreased strength (dizziness) OT Treatment Interventions: Self-care/ADL training;Therapeutic activities;Balance training;Patient/family education   OT Goals(Current goals can be found in the care plan section) Acute Rehab OT Goals Patient Stated Goal: get back to normal OT Goal Formulation: With patient Time For Goal Achievement: 06/08/13 Potential to Achieve Goals: Good ADL Goals Additional ADL Goal #1: pt will gather clothes and perform adl at mod I level Additional ADL Goal #2: pt will perform toilet and shower transfers at mod I level Additional ADL Goal #3: pt will perform bed mobility at independent level without dizziness  Visit Information  Last OT Received On: 05/25/13 Assistance Needed: +1 History of Present Illness: 79 yo female admitted with syncope, collapse. Hx of neuritis (per pt)       Prior Functioning     Home Living Family/patient expects to be discharged to:: Private residence Living Arrangements: Alone Type of Home: Apartment Home Access: Stairs to enter;Elevator Entrance Stairs-Number of Steps: 12 steps to enter building (pt normally takes steps) Home Layout: One level Home Equipment: Environmental education officer Comments: handicapped apt at [employer] I living Prior Function Level of Independence: Independent Communication Communication: No difficulties  Vision/Perception     Cognition  Cognition Arousal/Alertness: Awake/alert Behavior During Therapy: WFL for tasks  assessed/performed Overall Cognitive Status: Within Functional Limits for tasks assessed    Extremity/Trunk Assessment Upper Extremity Assessment Upper Extremity Assessment: Overall WFL for tasks assessed Lower Extremity Assessment Lower Extremity Assessment: Overall WFL for tasks assessed (although pt feels a litte "weak" from being in bed) Cervical / Trunk Assessment Cervical / Trunk Assessment: Normal     Mobility Bed Mobility Bed Mobility: Supine to Sit Supine to Sit: 7: Independent Transfers Sit to Stand: 7: Independent Stand to Sit: 7: Independent     Exercise     Balance Balance Balance Assessed: Yes Dynamic Standing Balance Dynamic Standing - Balance Support: No upper extremity supported Dynamic Standing - Level of Assistance: 5: Stand by assistance Dynamic Standing - Comments: had pt perform head turns (up, down, left, right) while ambulating. NO LOB or dizziness/lightheadedness   End of Session OT - End of Session Activity Tolerance: Patient tolerated treatment well Patient left: in chair;with call bell/phone within reach  GO     Barba Solt 05/25/2013, 5:05 PM Marica Otter, OTR/L [phone] 05/25/2013

## 2013-05-26 DIAGNOSIS — R55 Syncope and collapse: Secondary | ICD-10-CM | POA: Diagnosis not present

## 2013-05-26 LAB — CBC
HCT: 38.7 % (ref 36.0–46.0)
MCH: 31.3 pg (ref 26.0–34.0)
MCV: 90.4 fL (ref 78.0–100.0)
Platelets: 309 10*3/uL (ref 150–400)
RBC: 4.28 MIL/uL (ref 3.87–5.11)
RDW: 14.3 % (ref 11.5–15.5)
WBC: 8.2 10*3/uL (ref 4.0–10.5)

## 2013-05-26 LAB — BASIC METABOLIC PANEL
Calcium: 9.3 mg/dL (ref 8.4–10.5)
Chloride: 100 mEq/L (ref 96–112)
Creatinine, Ser: 0.65 mg/dL (ref 0.50–1.10)
GFR calc Af Amer: >90 mL/min (ref >90)
Sodium: 135 mEq/L (ref 135–145)

## 2013-05-26 LAB — GLUCOSE, CAPILLARY: Glucose-Capillary: 130 mg/dL — ABNORMAL HIGH (ref 70–99)

## 2013-05-26 MED ORDER — UNABLE TO FIND: Status: DC

## 2013-05-26 MED ORDER — MECLIZINE HCL 12.5 MG PO TABS: 12.5000 mg | ORAL_TABLET | Freq: Three times a day (TID) | ORAL | Status: DC | PRN

## 2013-05-26 NOTE — Discharge Summary (Signed)
[facility]  Physician Discharge Summary   Patient ID: Laurie Klein MRN: [id number] DOB/AGE: September 02, 1930 79 y.o.  Admit date: 05/24/2013 Discharge date: 05/26/2013  PCP: Ginette Otto, MD  DISCHARGE DIAGNOSES:  Principal Problem:   Syncope and collapse Active Problems:   MVP (mitral valve prolapse)   Anxiety   Dyslipidemia   Hypothyroidism   Headache(784.0): chronic   PAC (premature atrial contraction)   RECOMMENDATIONS FOR OUTPATIENT FOLLOW UP: 1. Patient couldn't undergo EEG. Please address. 2. Meclizine ordered from Vertiginous symptoms. Given prescription for OP vestibular rehab  DISCHARGE CONDITION: fair  Diet recommendation: Low Sodium  Filed Weights   05/24/13 1530 05/25/13 0500 05/26/13 0457  Weight: 55.339 kg (122 lb) 55.838 kg (123 lb 1.6 oz) 55.566 kg (122 lb 8 oz)    INITIAL HISTORY: Laurie Klein is a 79 y.o. female with history of anxiety, hypothyroidism, hyperlipidemia, MVP, PAC who presents to ED with a syncopal episode. Patient states 2 weeks ago she fell when she passed out on the way to the bathroom at night with LOC. Patient stated she felt dizzy/woozy prior to her fall/syncope. Patient stated she saw PCP the next day and labs were done which were unremarkable. On day of admission patient felt warm with some diaphoresis. She sat down and then passed out.   Consultations:  Cardiology  Procedures: Carotid Dopplers: No significant stenosis   ECHO 11/17  Left ventricle: The cavity size was normal. Systolic function was normal. The estimated ejection fraction was in the range of 60% to 65%. Wall motion was normal; there were no regional wall motion abnormalities.  HOSPITAL COURSE:   Syncope Etiology Remains unclear. No arrythmia on monitor. Patient not orthostatic. CT head and MRI negative. Patient with recent 2 d echo with EF = 60-65% with normal wall motion. No significant valvular abnormalities noted. EKG done had normal sinus rhythm  with PACs. D dimer was normal. She has ruled out for ACS. Cardiology has seen and cleared for discharge. EEG was ordered but could not be done as the machine was broken. This is to be considered as outpatient. Carotid dopplers shows no significant stenoses. She was sen by PT/OT and vertiginous symptoms were elicited. She will be prescribed Meclizine and OP OT vestibular rehab.   History of mitral valve prolapse  Stable. 2-D echo obtained recently with EF of 60-65%. No wall motion abnormalities.   Hypothyroidism  Continue home dose Synthroid. TSH normal.   Dyslipidemia  Continue fish oil and statin.   History of Anxiety  Stable.   She remains stable for discharge. She has ambulated with PT and OT without difficulty.   PERTINENT LABS:  The results of significant diagnostics from this hospitalization (including imaging, microbiology, ancillary and laboratory) are listed below for reference.    Microbiology: Recent Results (from the past 240 hour(s))  URINE CULTURE     Status: None   Collection Time    05/24/13  5:50 PM      Result Value Range Status   Specimen Description URINE, CLEAN CATCH   Final   Special Requests NONE   Final   Culture  Setup Time     Final   Value: 05/25/2013 00:37     Performed at [facility] Count     Final   Value: NO GROWTH     Performed at [facility]   Culture     Final   Value: NO GROWTH     Performed at [facility]   Report Status 05/25/2013 FINAL   Final     Labs: Basic Metabolic Panel:  Recent Labs Lab 05/24/13 1035 05/24/13 1601 05/25/13 0350 05/26/13 0403  NA 135  --  136 135  K 3.9  --  3.9 3.6  CL 98  --  104 100  CO2 27  --  24 25  GLUCOSE 142*  --  90 84  BUN 10  --  14 13  CREATININE 0.67 0.66 0.73 0.65  CALCIUM 9.9  --  9.0 9.3  MG  --  2.2  --   --    CBC:  Recent Labs Lab 05/24/13 1035 05/24/13 1601 05/25/13 0350 05/26/13 0403  WBC 11.6* 11.1* 9.3 8.2  NEUTROABS 9.6*  --    --   --   HGB 13.9 13.9 12.3 13.4  HCT 41.2 41.3 36.5 38.7  MCV 91.8 91.0 91.0 90.4  PLT 347 353 293 309   Cardiac Enzymes:  Recent Labs Lab 05/24/13 1035 05/24/13 1601 05/24/13 2145 05/25/13 0350  TROPONINI <0.30 <0.30 <0.30 <0.30   CBG:  Recent Labs Lab 05/25/13 0748 05/26/13 0754  GLUCAP 87 130*     IMAGING STUDIES Dg Chest 2 View  05/24/2013   CLINICAL DATA:  Cough.  EXAM: CHEST  2 VIEW  COMPARISON:  11/11/2009.  FINDINGS: Mediastinum and hilar structures are normal. Mild apical stable interstitial thickening and pleural thickening noted consistent with scarring. No focal infiltrate. No evidence of pleural effusion or pneumothorax. Heart size and pulmonary vascularity normal. No acute osseous abnormality. Calcified nodule in the left breast. This is most likely calcification of fibroadenoma. Calcification of the left upper quadrant most likely benign calcification in the spleen.  IMPRESSION: No active cardiopulmonary disease.  Stable chest from 11/11/2009.   Electronically Signed   By: Maisie Fus  Register   On: 05/24/2013 10:27   Ct Head Wo Contrast  05/24/2013   CLINICAL DATA:  Fall.  Head injury  EXAM: CT HEAD WITHOUT CONTRAST  TECHNIQUE: Contiguous axial images were obtained from the base of the skull through the vertex without intravenous contrast.  COMPARISON:  None.  FINDINGS: Mild atrophy and mild chronic microvascular ischemic change in the white matter.  Negative for acute infarct, hemorrhage, mass. Negative for skull fracture. Intracranial atherosclerotic disease is noted.  IMPRESSION: No acute abnormality.   Electronically Signed   By: Marlan Palau M.D.   On: 05/24/2013 11:00   Mr Laqueta Jean ZO Contrast  05/24/2013   CLINICAL DATA:  Fall 2 weeks ago hitting back of head, dizziness ever since.  EXAM: MRI HEAD WITHOUT AND WITH CONTRAST  TECHNIQUE: Multiplanar, multiecho pulse sequences of the brain and surrounding structures were obtained without and with intravenous  contrast.  CONTRAST:  10mL MULTIHANCE GADOBENATE DIMEGLUMINE 529 MG/ML IV SOLN  COMPARISON:  05/24/2013 CT.  No comparison MR.  FINDINGS: No acute infarct.  No intracranial hemorrhage.  Mild small vessel disease type changes.  Mild atrophy without hydrocephalus.  No intracranial mass or abnormal enhancement.  Major intracranial vascular structures are patent.  Cervical medullary junction, pituitary region, pineal region and orbital structures unremarkable.  IMPRESSION: No acute abnormality.  Please see above.   Electronically Signed   By: Bridgett Larsson M.D.   On: 05/24/2013 19:39    DISCHARGE EXAMINATION: Filed Vitals:   05/25/13 0607 05/25/13 1412 05/25/13 2047 05/26/13 0457  BP: 133/63 120/45 138/65 140/68  Pulse: 78 62 56 62  Temp:  98.2 F (36.8 C) 97.7 F (  36.5 C) 98.2 F (36.8 C)  TempSrc:  Oral Oral Oral  Resp:  16 16 16   Height:      Weight:    55.566 kg (122 lb 8 oz)  SpO2:  99% 98% 99%   General appearance: alert, cooperative, appears stated age and no distress Resp: clear to auscultation bilaterally Cardio: regular rate and rhythm, S1, S2 normal, no murmur, click, rub or gallop Extremities: extremities normal, atraumatic, no cyanosis or edema Neurologic: Alert and oriented X 3, normal strength and tone. Normal symmetric reflexes. Normal coordination and gait  DISPOSITION: Back to independent living facility  Discharge Orders   Future Appointments Provider Department Dept Phone   05/28/2013 8:00 AM [name] EEG [phone]   Future Orders Complete By Expires   Diet - low sodium heart healthy  As directed    Discharge instructions  As directed    Comments:     Please follow up with Ginette Otto, MD next week. Discuss need for EEG with him.   Increase activity slowly  As directed    OT/PT Vestibular Evaluation  As directed 05/26/2014      ALLERGIES:  Allergies  Allergen Reactions  . Acrylic Polymer   . Lipitor [Atorvastatin  Calcium] Other (See Comments)    myalgias  . Orudis Kt   . Wax     Current Discharge Medication List    START taking these medications   Details  meclizine (ANTIVERT) 12.5 MG tablet Take 1 tablet (12.5 mg total) by mouth 3 (three) times daily as needed for dizziness. Qty: 30 tablet, Refills: 0    UNABLE TO FIND Outpatient Occupational Therapy for Vestibular Rehab for Vertigo Qty: 1 each, Refills: 0      CONTINUE these medications which have NOT CHANGED   Details  Ascorbic Acid (VITAMIN C PO) Take by mouth.    aspirin 81 MG tablet Take 81 mg by mouth daily.    Calcium Citrate (CITRACAL PO) Take by mouth.    cholecalciferol (VITAMIN D) 1000 UNITS tablet Take 2,000 Units by mouth daily.    co-enzyme Q-10 30 MG capsule Take 100 mg by mouth 2 (two) times daily.    fish oil-omega-3 fatty acids 1000 MG capsule Take 1 capsule by mouth daily.    Flaxseed, Linseed, (FLAX SEED OIL PO) Take 45 mLs by mouth daily.    levothyroxine (SYNTHROID, LEVOTHROID) 88 MCG tablet Take 88 mcg by mouth daily.    Multiple Vitamin (MULTIVITAMIN) capsule Take 1 capsule by mouth daily.    simvastatin (ZOCOR) 20 MG tablet Take 20 mg by mouth every evening.    Specialty Vitamins Products (MAGNESIUM, AMINO ACID CHELATE,) 133 MG tablet Take 1 tablet by mouth every other day.       Follow-up Information   Follow up with Ginette Otto, MD. Schedule an appointment as soon as possible for a visit in 4 days.   Specialty:  Internal Medicine   Contact information:   [address] [place] [address] [phone]       TOTAL DISCHARGE TIME: 35 mins  [facility]  [facility] Pager [phone]  05/26/2013, 10:22 AM

## 2013-05-26 NOTE — Progress Notes (Signed)
Physical Therapy Treatment Patient Details Name: Laurie Klein MRN: [id number] DOB: 1930-10-06 Today's Date: 05/26/2013 Time: 6213-0865 PT Time Calculation (min): 31 min  PT Assessment / Plan / Recommendation  History of Present Illness 79 yo female admitted with syncope, collapse. Hx of neuritis (per pt)   PT Comments   Retested modified Gilberto Better for R BPPV and pt with rotational nystagmus < 30 sec so treated with Semont maneuver.  Increased amount of time spent educating pt on BPPV and how to perform Semont maneuver in case symptoms persist.  Pt provided with Semont handout as well.  Pt states she can arrange transport to outpatient vestibular rehab if symptoms do persist and Semont not managing symptoms.  Also discussed how her dizziness/lightheadness/syncope and spinning vertigo sensation are likely related to separate diagnoses and today's treatment was in relation to vertigo (BPPV).    Follow Up Recommendations  Outpatient PT (vestibular)     Does the patient have the potential to tolerate intense rehabilitation     Barriers to Discharge        Equipment Recommendations  None recommended by PT    Recommendations for Other Services    Frequency     Progress towards PT Goals Progress towards PT goals: Progressing toward goals  Plan Current plan remains appropriate    Precautions / Restrictions Precautions Precautions: Fall Precaution Comments: syncope   Pertinent Vitals/Pain N/a    Mobility  Bed Mobility Bed Mobility: Supine to Sit Supine to Sit: 7: Independent Transfers Transfers: Sit to Stand;Stand to Sit Sit to Stand: 7: Independent Stand to Sit: 7: Independent Ambulation/Gait Ambulation/Gait Assistance: 5: Supervision Ambulation Distance (Feet): 400 Feet Assistive device: None Ambulation/Gait Assistance Details: supervision for safety as pt reports no dizziness but extra cautious as she is fearful of falling, occasional reached to use hand rail for extra  security Gait Pattern: Step-through pattern Gait velocity: decreased    Exercises     PT Diagnosis:    PT Problem List:   PT Treatment Interventions:     PT Goals (current goals can now be found in the care plan section)    Visit Information  Last PT Received On: 05/26/13 Assistance Needed: +1 History of Present Illness: 79 yo female admitted with syncope, collapse. Hx of neuritis (per pt)    Subjective Data      Cognition  Cognition Arousal/Alertness: Awake/alert Behavior During Therapy: WFL for tasks assessed/performed Overall Cognitive Status: Within Functional Limits for tasks assessed    Balance     End of Session PT - End of Session Activity Tolerance: Patient tolerated treatment well Patient left: in chair;with call bell/phone within reach (with MD)   GP     Maida Sale E 05/26/2013, 1:09 PM Zenovia Jarred, PT, DPT 05/26/2013 Pager: [phone]

## 2013-05-26 NOTE — Progress Notes (Signed)
Subjective:  Doing well, no dizziness, no syncope, no CP  Objective:  Vital Signs in the last 24 hours: Temp:  [97.7 F (36.5 C)-98.2 F (36.8 C)] 98.2 F (36.8 C) (11/22 0457) Pulse Rate:  [56-62] 62 (11/22 0457) Resp:  [16] 16 (11/22 0457) BP: (120-140)/(45-68) 140/68 mmHg (11/22 0457) SpO2:  [98 %-99 %] 99 % (11/22 0457) Weight:  [122 lb 8 oz (55.566 kg)] 122 lb 8 oz (55.566 kg) (11/22 0457)  Intake/Output from previous day: 11/21 0701 - 11/22 0700 In: 1460 [P.O.:1460] Out: 2500 [Urine:2500]   Physical Exam: General: Well developed, well nourished, in no acute distress. Head:  Normocephalic and atraumatic. Lungs: Clear to auscultation and percussion. Heart: Normal S1 and S2.  No murmur, rubs or gallops.  Abdomen: soft, non-tender, positive bowel sounds. Extremities: No clubbing or cyanosis. No edema. Neurologic: Alert and oriented x 3.    Lab Results:  Recent Labs  05/25/13 0350 05/26/13 0403  WBC 9.3 8.2  HGB 12.3 13.4  PLT 293 309    Recent Labs  05/25/13 0350 05/26/13 0403  NA 136 135  K 3.9 3.6  CL 104 100  CO2 24 25  GLUCOSE 90 84  BUN 14 13  CREATININE 0.73 0.65    Recent Labs  05/24/13 2145 05/25/13 0350  TROPONINI <0.30 <0.30   Hepatic Function Panel No results found for this basename: PROT, ALBUMIN, AST, ALT, ALKPHOS, BILITOT, BILIDIR, IBILI,  in the last 72 hours No results found for this basename: CHOL,  in the last 72 hours No results found for this basename: PROTIME,  in the last 72 hours  Imaging: Dg Chest 2 View  05/24/2013   CLINICAL DATA:  Cough.  EXAM: CHEST  2 VIEW  COMPARISON:  11/11/2009.  FINDINGS: Mediastinum and hilar structures are normal. Mild apical stable interstitial thickening and pleural thickening noted consistent with scarring. No focal infiltrate. No evidence of pleural effusion or pneumothorax. Heart size and pulmonary vascularity normal. No acute osseous abnormality. Calcified nodule in the left breast.  This is most likely calcification of fibroadenoma. Calcification of the left upper quadrant most likely benign calcification in the spleen.  IMPRESSION: No active cardiopulmonary disease.  Stable chest from 11/11/2009.   Electronically Signed   By: Maisie Fus  Register   On: 05/24/2013 10:27   Ct Head Wo Contrast  05/24/2013   CLINICAL DATA:  Fall.  Head injury  EXAM: CT HEAD WITHOUT CONTRAST  TECHNIQUE: Contiguous axial images were obtained from the base of the skull through the vertex without intravenous contrast.  COMPARISON:  None.  FINDINGS: Mild atrophy and mild chronic microvascular ischemic change in the white matter.  Negative for acute infarct, hemorrhage, mass. Negative for skull fracture. Intracranial atherosclerotic disease is noted.  IMPRESSION: No acute abnormality.   Electronically Signed   By: Marlan Palau M.D.   On: 05/24/2013 11:00   Mr Laqueta Jean ZO Contrast  05/24/2013   CLINICAL DATA:  Fall 2 weeks ago hitting back of head, dizziness ever since.  EXAM: MRI HEAD WITHOUT AND WITH CONTRAST  TECHNIQUE: Multiplanar, multiecho pulse sequences of the brain and surrounding structures were obtained without and with intravenous contrast.  CONTRAST:  10mL MULTIHANCE GADOBENATE DIMEGLUMINE 529 MG/ML IV SOLN  COMPARISON:  05/24/2013 CT.  No comparison MR.  FINDINGS: No acute infarct.  No intracranial hemorrhage.  Mild small vessel disease type changes.  Mild atrophy without hydrocephalus.  No intracranial mass or abnormal enhancement.  Major intracranial vascular structures are  patent.  Cervical medullary junction, pituitary region, pineal region and orbital structures unremarkable.  IMPRESSION: No acute abnormality.  Please see above.   Electronically Signed   By: Bridgett Larsson M.D.   On: 05/24/2013 19:39     Telemetry: NSR Personally viewed.   Assessment/Plan:  Principal Problem:   Syncope and collapse Active Problems:   MVP (mitral valve prolapse)   Anxiety   Dyslipidemia    Hypothyroidism   Headache(784.0): chronic   PAC (premature atrial contraction)  1) Syncope  - possible vagal  - orthostatics are normal  - ECHO normal EF reassuring  - OK for DC from my perspective.  - As Dr. Katrinka Blazing suggested, getting her set up with 30 day event monitor may not be unreasonable. Since it is the weekend now, Please have her call the office on Monday to discuss possible monitor. Thanks.    Laurie Klein 05/26/2013, 7:41 AM

## 2013-05-28 ENCOUNTER — Other Ambulatory Visit (HOSPITAL_COMMUNITY): Payer: Medicare Other

## 2013-06-05 DIAGNOSIS — Z85820 Personal history of malignant melanoma of skin: Secondary | ICD-10-CM | POA: Diagnosis not present

## 2013-06-05 DIAGNOSIS — L723 Sebaceous cyst: Secondary | ICD-10-CM | POA: Diagnosis not present

## 2013-06-05 DIAGNOSIS — Z808 Family history of malignant neoplasm of other organs or systems: Secondary | ICD-10-CM | POA: Diagnosis not present

## 2013-06-05 DIAGNOSIS — Z411 Encounter for cosmetic surgery: Secondary | ICD-10-CM | POA: Diagnosis not present

## 2013-06-05 DIAGNOSIS — L821 Other seborrheic keratosis: Secondary | ICD-10-CM | POA: Diagnosis not present

## 2013-06-06 DIAGNOSIS — H8110 Benign paroxysmal vertigo, unspecified ear: Secondary | ICD-10-CM | POA: Diagnosis not present

## 2013-06-06 DIAGNOSIS — Z9181 History of falling: Secondary | ICD-10-CM | POA: Diagnosis not present

## 2013-06-11 DIAGNOSIS — R55 Syncope and collapse: Secondary | ICD-10-CM | POA: Diagnosis not present

## 2013-06-11 DIAGNOSIS — R42 Dizziness and giddiness: Secondary | ICD-10-CM | POA: Diagnosis not present

## 2013-08-20 ENCOUNTER — Other Ambulatory Visit: Payer: Self-pay

## 2013-08-20 DIAGNOSIS — Z1231 Encounter for screening mammogram for malignant neoplasm of breast: Secondary | ICD-10-CM

## 2013-09-11 DIAGNOSIS — J019 Acute sinusitis, unspecified: Secondary | ICD-10-CM | POA: Diagnosis not present

## 2013-09-20 ENCOUNTER — Emergency Department (HOSPITAL_COMMUNITY): Payer: Medicare Other

## 2013-09-20 ENCOUNTER — Encounter (HOSPITAL_COMMUNITY): Payer: Self-pay | Admitting: Emergency Medicine

## 2013-09-20 ENCOUNTER — Emergency Department (HOSPITAL_COMMUNITY)
Admission: EM | Admit: 2013-09-20 | Discharge: 2013-09-20 | Disposition: A | Discharge status: Home / Self Care | Payer: Medicare Other | Attending: Emergency Medicine | Admitting: Emergency Medicine

## 2013-09-20 DIAGNOSIS — R42 Dizziness and giddiness: Secondary | ICD-10-CM | POA: Diagnosis not present

## 2013-09-20 DIAGNOSIS — Z7982 Long term (current) use of aspirin: Secondary | ICD-10-CM | POA: Diagnosis not present

## 2013-09-20 DIAGNOSIS — IMO0002 Reserved for concepts with insufficient information to code with codable children: Secondary | ICD-10-CM | POA: Diagnosis not present

## 2013-09-20 DIAGNOSIS — J069 Acute upper respiratory infection, unspecified: Secondary | ICD-10-CM | POA: Insufficient documentation

## 2013-09-20 DIAGNOSIS — E785 Hyperlipidemia, unspecified: Secondary | ICD-10-CM | POA: Diagnosis not present

## 2013-09-20 DIAGNOSIS — Z87891 Personal history of nicotine dependence: Secondary | ICD-10-CM | POA: Insufficient documentation

## 2013-09-20 DIAGNOSIS — E039 Hypothyroidism, unspecified: Secondary | ICD-10-CM | POA: Diagnosis not present

## 2013-09-20 DIAGNOSIS — Z8679 Personal history of other diseases of the circulatory system: Secondary | ICD-10-CM | POA: Insufficient documentation

## 2013-09-20 DIAGNOSIS — Z8719 Personal history of other diseases of the digestive system: Secondary | ICD-10-CM | POA: Insufficient documentation

## 2013-09-20 DIAGNOSIS — G8929 Other chronic pain: Secondary | ICD-10-CM | POA: Diagnosis not present

## 2013-09-20 DIAGNOSIS — R51 Headache: Secondary | ICD-10-CM | POA: Diagnosis not present

## 2013-09-20 DIAGNOSIS — R071 Chest pain on breathing: Secondary | ICD-10-CM | POA: Insufficient documentation

## 2013-09-20 DIAGNOSIS — Z79899 Other long term (current) drug therapy: Secondary | ICD-10-CM | POA: Diagnosis not present

## 2013-09-20 DIAGNOSIS — F411 Generalized anxiety disorder: Secondary | ICD-10-CM | POA: Insufficient documentation

## 2013-09-20 DIAGNOSIS — N390 Urinary tract infection, site not specified: Secondary | ICD-10-CM | POA: Diagnosis not present

## 2013-09-20 DIAGNOSIS — R5381 Other malaise: Secondary | ICD-10-CM | POA: Insufficient documentation

## 2013-09-20 DIAGNOSIS — R5383 Other fatigue: Secondary | ICD-10-CM

## 2013-09-20 DIAGNOSIS — R0602 Shortness of breath: Secondary | ICD-10-CM | POA: Diagnosis not present

## 2013-09-20 DIAGNOSIS — R079 Chest pain, unspecified: Secondary | ICD-10-CM | POA: Diagnosis not present

## 2013-09-20 DIAGNOSIS — R531 Weakness: Secondary | ICD-10-CM

## 2013-09-20 DIAGNOSIS — R0781 Pleurodynia: Secondary | ICD-10-CM

## 2013-09-20 LAB — CBG MONITORING, ED: Glucose-Capillary: 107 mg/dL — ABNORMAL HIGH (ref 70–99)

## 2013-09-20 LAB — BASIC METABOLIC PANEL
BUN: 12 mg/dL (ref 6–23)
CALCIUM: 10.2 mg/dL (ref 8.4–10.5)
CO2: 25 mEq/L (ref 19–32)
Chloride: 95 mEq/L — ABNORMAL LOW (ref 96–112)
Creatinine, Ser: 0.6 mg/dL (ref 0.50–1.10)
GFR, EST NON AFRICAN AMERICAN: 83 mL/min — AB (ref >90)
GLUCOSE: 107 mg/dL — AB (ref 70–99)
Potassium: 4.4 mEq/L (ref 3.7–5.3)
Sodium: 135 mEq/L — ABNORMAL LOW (ref 137–147)

## 2013-09-20 LAB — URINALYSIS, ROUTINE W REFLEX MICROSCOPIC
Bilirubin Urine: NEGATIVE
GLUCOSE, UA: NEGATIVE mg/dL
Hgb urine dipstick: NEGATIVE
Ketones, ur: NEGATIVE mg/dL
Leukocytes, UA: NEGATIVE
NITRITE: NEGATIVE
PH: 8 (ref 5.0–8.0)
Protein, ur: NEGATIVE mg/dL
SPECIFIC GRAVITY, URINE: 1.006 (ref 1.005–1.030)
Urobilinogen, UA: 0.2 mg/dL (ref 0.0–1.0)

## 2013-09-20 LAB — CBC
HCT: 40.3 % (ref 36.0–46.0)
HEMOGLOBIN: 13.5 g/dL (ref 12.0–15.0)
MCH: 29.7 pg (ref 26.0–34.0)
MCHC: 33.5 g/dL (ref 30.0–36.0)
MCV: 88.8 fL (ref 78.0–100.0)
Platelets: 491 10*3/uL — ABNORMAL HIGH (ref 150–400)
RBC: 4.54 MIL/uL (ref 3.87–5.11)
RDW: 13.6 % (ref 11.5–15.5)
WBC: 10.5 10*3/uL (ref 4.0–10.5)

## 2013-09-20 LAB — URINE MICROSCOPIC-ADD ON

## 2013-09-20 LAB — PRO B NATRIURETIC PEPTIDE: Pro B Natriuretic peptide (BNP): 132.8 pg/mL (ref 0–450)

## 2013-09-20 LAB — I-STAT TROPONIN, ED: Troponin i, poc: 0 ng/mL (ref 0.00–0.08)

## 2013-09-20 LAB — D-DIMER, QUANTITATIVE (NOT AT ARMC): D DIMER QUANT: 0.83 ug{FEU}/mL — AB (ref 0.00–0.48)

## 2013-09-20 MED ORDER — SODIUM CHLORIDE 0.9 % IV BOLUS (SEPSIS)
1000.0000 mL | Freq: Once | INTRAVENOUS | Status: AC
  Administered 2013-09-20: 1000 mL via INTRAVENOUS

## 2013-09-20 MED ORDER — IOHEXOL 350 MG/ML SOLN
100.0000 mL | Freq: Once | INTRAVENOUS | Status: AC | PRN
  Administered 2013-09-20: 100 mL via INTRAVENOUS

## 2013-09-20 NOTE — ED Notes (Signed)
Patient transported to X-ray 

## 2013-09-20 NOTE — Discharge Instructions (Signed)
Chest Pain (Nonspecific) °It is often hard to give a specific diagnosis for the cause of chest pain. There is always a chance that your pain could be related to something serious, such as a heart attack or a blood clot in the lungs. You need to follow up with your caregiver for further evaluation. °CAUSES  °· Heartburn. °· Pneumonia or bronchitis. °· Anxiety or stress. °· Inflammation around your heart (pericarditis) or lung (pleuritis or pleurisy). °· A blood clot in the lung. °· A collapsed lung (pneumothorax). It can develop suddenly on its own (spontaneous pneumothorax) or from injury (trauma) to the chest. °· Shingles infection (herpes zoster virus). °The chest wall is composed of bones, muscles, and cartilage. Any of these can be the source of the pain. °· The bones can be bruised by injury. °· The muscles or cartilage can be strained by coughing or overwork. °· The cartilage can be affected by inflammation and become sore (costochondritis). °DIAGNOSIS  °Lab tests or other studies, such as X-rays, electrocardiography, stress testing, or cardiac imaging, may be needed to find the cause of your pain.  °TREATMENT  °· Treatment depends on what may be causing your chest pain. Treatment may include: °· Acid blockers for heartburn. °· Anti-inflammatory medicine. °· Pain medicine for inflammatory conditions. °· Antibiotics if an infection is present. °· You may be advised to change lifestyle habits. This includes stopping smoking and avoiding alcohol, caffeine, and chocolate. °· You may be advised to keep your head raised (elevated) when sleeping. This reduces the chance of acid going backward from your stomach into your esophagus. °· Most of the time, nonspecific chest pain will improve within 2 to 3 days with rest and mild pain medicine. °HOME CARE INSTRUCTIONS  °· If antibiotics were prescribed, take your antibiotics as directed. Finish them even if you start to feel better. °· For the next few days, avoid physical  activities that bring on chest pain. Continue physical activities as directed. °· Do not smoke. °· Avoid drinking alcohol. °· Only take over-the-counter or prescription medicine for pain, discomfort, or fever as directed by your caregiver. °· Follow your caregiver's suggestions for further testing if your chest pain does not go away. °· Keep any follow-up appointments you made. If you do not go to an appointment, you could develop lasting (chronic) problems with pain. If there is any problem keeping an appointment, you must call to reschedule. °SEEK MEDICAL CARE IF:  °· You think you are having problems from the medicine you are taking. Read your medicine instructions carefully. °· Your chest pain does not go away, even after treatment. °· You develop a rash with blisters on your chest. °SEEK IMMEDIATE MEDICAL CARE IF:  °· You have increased chest pain or pain that spreads to your arm, neck, jaw, back, or abdomen. °· You develop shortness of breath, an increasing cough, or you are coughing up blood. °· You have severe back or abdominal pain, feel nauseous, or vomit. °· You develop severe weakness, fainting, or chills. °· You have a fever. °THIS IS AN EMERGENCY. Do not wait to see if the pain will go away. Get medical help at once. Call your local emergency services (911 in [place] Do not drive yourself to the hospital. °MAKE SURE YOU:  °· Understand these instructions. °· Will watch your condition. °· Will get help right away if you are not doing well or get worse. °Document Released: 03/31/2005 Document Revised: 09/13/2011 Document Reviewed: 01/25/2008 °[employer]® Patient Information ©2014 [employer]   [employer]. ° °

## 2013-09-20 NOTE — ED Notes (Signed)
Bed: KP54 Expected date:  Expected time:  Means of arrival:  Comments: EMS- elderly, generalized weakness, cough

## 2013-09-20 NOTE — ED Provider Notes (Signed)
CSN: [id number]     Arrival date & time 09/20/13  0831 History   First MD Initiated Contact with Patient 09/20/13 [phone]     Chief Complaint  Patient presents with  . Shortness of Breath  . Chest Pain     (Consider location/radiation/quality/duration/timing/severity/associated sxs/prior Treatment) HPI Comments: 80 year old female presents with 3 days of pleuritic chest pain and feeling lightheaded and overall weakness today. She states a week or 2 ago she saw her primary care physician for "sinus infection" was started on amoxicillin, had a reaction to this and will start on a different antibiotic. She has 2 days left of this antibiotic. She states she is feeling better than last couple days began to have a pleuritic chest pain, worse with inspiration and cough. When she's not coughing or taking a deep breath she has no chest pain. Denies any hemoptysis or leg swelling. Has never had a blood clot before. She denies any current shortness of breath. She states this morning she felt overall weak. She ate breakfast and felt a little bit better but still feels a little bit of diffuse weakness. She ambulated to the bathroom prior to my arrival and states she felt fine doing this.   Past Medical History  Diagnosis Date  . Degenerative disc disease   . Anxiety   . Spinal stenosis   . Mitral valve prolapse   . Dyslipidemia   . Hypothyroidism   . TMJ syndrome   . Chronic headaches     history of  . Heart palpitations   . PAC (premature atrial contraction)   . MVP (mitral valve prolapse) 05/24/2013  . Headache(784.0): chronic 05/24/2013  . DDD (degenerative disc disease) 05/24/2013   Past Surgical History  Procedure Laterality Date  . Tonsillectomy    . Appendectomy    . Breast biopsy  3 times  . Disectomy      micro lumbar  . Cholecystectomy     Family History  Problem Relation Age of Onset  . Heart disease Father   . Heart failure Mother   . Stroke Mother   . Melanoma Child     History  Substance Use Topics  . Smoking status: Former Smoker -- 0.50 packs/day    Types: Cigarettes    Quit date: 10/03/1973  . Smokeless tobacco: Never Used  . Alcohol Use: 1.5 oz/week    3 drink(s) per week     Comment: 4 glasses wine per week   OB History   Grav Para Term Preterm Abortions TAB SAB Ect Mult Living                 Review of Systems  Constitutional: Negative for fever (had a fever x 3 days over a week ago, none since).  HENT: Positive for sinus pressure.   Respiratory: Positive for cough and shortness of breath.   Cardiovascular: Positive for chest pain. Negative for leg swelling.  Gastrointestinal: Negative for nausea, vomiting, abdominal pain and diarrhea.  Neurological: Positive for weakness.  All other systems reviewed and are negative.      Allergies  Acrylic polymer; Lipitor; Orudis kt; and Wax  Home Medications   Current Outpatient Rx  Name  Route  Sig  Dispense  Refill  . Ascorbic Acid (VITAMIN C PO)   Oral   Take by mouth.         Marland Kitchen aspirin 81 MG tablet   Oral   Take 81 mg by mouth daily.         Marland Kitchen  Calcium Citrate (CITRACAL PO)   Oral   Take by mouth.         . cholecalciferol (VITAMIN D) 1000 UNITS tablet   Oral   Take 2,000 Units by mouth daily.         Marland Kitchen co-enzyme Q-10 30 MG capsule   Oral   Take 100 mg by mouth 2 (two) times daily.         . fish oil-omega-3 fatty acids 1000 MG capsule   Oral   Take 1 capsule by mouth daily.         . Flaxseed, Linseed, (FLAX SEED OIL PO)   Oral   Take 45 mLs by mouth daily.         Marland Kitchen levothyroxine (SYNTHROID, LEVOTHROID) 88 MCG tablet   Oral   Take 88 mcg by mouth daily.         . meclizine (ANTIVERT) 12.5 MG tablet   Oral   Take 1 tablet (12.5 mg total) by mouth 3 (three) times daily as needed for dizziness.   30 tablet   0   . Multiple Vitamin (MULTIVITAMIN) capsule   Oral   Take 1 capsule by mouth daily.         . simvastatin (ZOCOR) 20 MG tablet    Oral   Take 20 mg by mouth every evening.         Marland Kitchen Specialty Vitamins Products (MAGNESIUM, AMINO ACID CHELATE,) 133 MG tablet   Oral   Take 1 tablet by mouth every other day.          BP 146/61  Pulse 69  Temp(Src) 97.5 F (36.4 C) (Oral)  Resp 20  SpO2 100% Physical Exam  Vitals reviewed. Constitutional: She is oriented to person, place, and time. She appears well-developed and well-nourished.  HENT:  Head: Normocephalic and atraumatic.  Right Ear: External ear normal.  Left Ear: External ear normal.  Nose: Nose normal.  Eyes: Right eye exhibits no discharge. Left eye exhibits no discharge.  Cardiovascular: Normal rate, regular rhythm and normal heart sounds.   Pulmonary/Chest: Effort normal and breath sounds normal. She has no wheezes. She has no rales. She exhibits tenderness (bilateral anterior chest wall).  Abdominal: Soft. She exhibits no distension. There is no tenderness.  Musculoskeletal: She exhibits no edema (no lower extremity swelling/edema).  Neurological: She is alert and oriented to person, place, and time.  Skin: Skin is warm and dry.    ED Course  Procedures (including critical care time) Labs Review Labs Reviewed  CBC - Abnormal; Notable for the following:    Platelets 491 (*)    All other components within normal limits  BASIC METABOLIC PANEL - Abnormal; Notable for the following:    Sodium 135 (*)    Chloride 95 (*)    Glucose, Bld 107 (*)    GFR calc non Af Amer 83 (*)    All other components within normal limits  URINALYSIS, ROUTINE W REFLEX MICROSCOPIC - Abnormal; Notable for the following:    APPearance TURBID (*)    All other components within normal limits  D-DIMER, QUANTITATIVE - Abnormal; Notable for the following:    D-Dimer, Quant 0.83 (*)    All other components within normal limits  CBG MONITORING, ED - Abnormal; Notable for the following:    Glucose-Capillary 107 (*)    All other components within normal limits  PRO B  NATRIURETIC PEPTIDE  URINE MICROSCOPIC-ADD ON  Randolm Idol, ED   Imaging Review  Dg Chest 2 View  09/20/2013   CLINICAL DATA:  SHORTNESS OF BREATH CHEST PAIN  EXAM: CHEST  2 VIEW  COMPARISON:  None.  FINDINGS: The heart size and mediastinal contours are within normal limits. Both lungs are clear. The visualized skeletal structures are unremarkable. Stable calcified density left breast. Stable left upper quadrant calcifications.  IMPRESSION: No active cardiopulmonary disease.   Electronically Signed   By: Margaree Mackintosh M.D.   On: 09/20/2013 09:11   Ct Angio Chest Pe W/cm &/or Wo Cm  09/20/2013   CLINICAL DATA:  Pleuritic chest pain. Elevated D-dimer. Personal history of melanoma.  EXAM: CT ANGIOGRAPHY CHEST WITH CONTRAST  TECHNIQUE: Multidetector CT imaging of the chest was performed using the standard protocol during bolus administration of intravenous contrast. Multiplanar CT image reconstructions and MIPs were obtained to evaluate the vascular anatomy.  CONTRAST:  175mL OMNIPAQUE IOHEXOL 350 MG/ML SOLN  COMPARISON:  None.  FINDINGS: Satisfactory opacification of pulmonary arteries noted, and no pulmonary emboli identified. No evidence of thoracic aortic dissection or aneurysm. No evidence of mediastinal hematoma or mass.  No evidence of thoracic lymphadenopathy. No evidence of pulmonary infiltrate or central endobronchial lesion. No suspicious pulmonary nodules or masses are identified. Mild atelectasis or scarring is seen in left lower lobe.  Review of the MIP images confirms the above findings.  IMPRESSION: Negative. No evidence of pulmonary embolism or other significant abnormality.   Electronically Signed   By: Earle Gell M.D.   On: 09/20/2013 10:45     EKG Interpretation   Date/Time:  Thursday September 20 2013 08:36:19 EDT Ventricular Rate:  66 PR Interval:  150 QRS Duration: 77 QT Interval:  409 QTC Calculation: 428 R Axis:   46 Text Interpretation:  Sinus rhythm No acuet ischemia No  significant change  since last tracing Confirmed by [place] ([id number]) on 09/20/2013  8:48:47 AM      MDM   Final diagnoses:  Pleuritic chest pain  Upper respiratory infection  Weakness    Patient appears well here, no dyspnea or active chest pain. Her CP seems consistent with chest wall pathology given her pleuritic pain, recent cough and negative CTA. I have low suspicion that this is cardiac given the history, benign EKG and negative troponin with over 24 hours of symptoms. Her weakness is improved and she ambulated multiple times to the bathroom without assistance. I feel at this time she is ok for discharge with symptomatic care, and she will follow up closely with her PCP.     Ephraim Hamburger, MD 09/20/13 [phone]

## 2013-09-20 NOTE — ED Notes (Signed)
MD at bedside. 

## 2013-09-20 NOTE — ED Notes (Addendum)
Per EMS patient from [facility] to ED for sinus infection starting 2 weeks ago and nonproductive cough. Pt saw PCP 2 weeks ago, started amoxicillin, had allergic reaction to it, PCP started different medication, no relief since. Pt complains of chest wall pain, per EMS, and some generalized weakness.

## 2013-10-02 ENCOUNTER — Ambulatory Visit
Admission: RE | Admit: 2013-10-02 | Discharge: 2013-10-02 | Disposition: A | Discharge status: Home / Self Care | Payer: Medicare Other | Source: Ambulatory Visit

## 2013-10-02 DIAGNOSIS — Z1231 Encounter for screening mammogram for malignant neoplasm of breast: Secondary | ICD-10-CM | POA: Diagnosis not present

## 2013-10-23 DIAGNOSIS — Z961 Presence of intraocular lens: Secondary | ICD-10-CM | POA: Diagnosis not present

## 2013-10-23 DIAGNOSIS — H52209 Unspecified astigmatism, unspecified eye: Secondary | ICD-10-CM | POA: Diagnosis not present

## 2014-01-22 DIAGNOSIS — M545 Low back pain, unspecified: Secondary | ICD-10-CM | POA: Diagnosis not present

## 2014-01-22 DIAGNOSIS — E039 Hypothyroidism, unspecified: Secondary | ICD-10-CM | POA: Diagnosis not present

## 2014-01-22 DIAGNOSIS — Z1331 Encounter for screening for depression: Secondary | ICD-10-CM | POA: Diagnosis not present

## 2014-01-22 DIAGNOSIS — E7800 Pure hypercholesterolemia, unspecified: Secondary | ICD-10-CM | POA: Diagnosis not present

## 2014-01-22 DIAGNOSIS — Z23 Encounter for immunization: Secondary | ICD-10-CM | POA: Diagnosis not present

## 2014-01-22 DIAGNOSIS — Z79899 Other long term (current) drug therapy: Secondary | ICD-10-CM | POA: Diagnosis not present

## 2014-01-22 DIAGNOSIS — Z0000 Encounter for general adult medical examination without abnormal findings: Secondary | ICD-10-CM | POA: Diagnosis not present

## 2014-01-31 ENCOUNTER — Encounter: Payer: Self-pay | Admitting: Cardiology

## 2014-06-11 DIAGNOSIS — Z808 Family history of malignant neoplasm of other organs or systems: Secondary | ICD-10-CM | POA: Diagnosis not present

## 2014-06-11 DIAGNOSIS — Z85820 Personal history of malignant melanoma of skin: Secondary | ICD-10-CM | POA: Diagnosis not present

## 2014-06-11 DIAGNOSIS — L708 Other acne: Secondary | ICD-10-CM | POA: Diagnosis not present

## 2014-06-11 DIAGNOSIS — L821 Other seborrheic keratosis: Secondary | ICD-10-CM | POA: Diagnosis not present

## 2014-06-11 DIAGNOSIS — D2272 Melanocytic nevi of left lower limb, including hip: Secondary | ICD-10-CM | POA: Diagnosis not present

## 2014-08-30 ENCOUNTER — Other Ambulatory Visit: Payer: Self-pay

## 2014-08-30 DIAGNOSIS — Z1231 Encounter for screening mammogram for malignant neoplasm of breast: Secondary | ICD-10-CM

## 2014-10-08 ENCOUNTER — Ambulatory Visit
Admission: RE | Admit: 2014-10-08 | Discharge: 2014-10-08 | Disposition: A | Discharge status: Home / Self Care | Payer: Medicare Other | Source: Ambulatory Visit

## 2014-10-08 DIAGNOSIS — Z1231 Encounter for screening mammogram for malignant neoplasm of breast: Secondary | ICD-10-CM

## 2014-10-29 DIAGNOSIS — Z961 Presence of intraocular lens: Secondary | ICD-10-CM | POA: Diagnosis not present

## 2014-10-29 DIAGNOSIS — H04123 Dry eye syndrome of bilateral lacrimal glands: Secondary | ICD-10-CM | POA: Diagnosis not present

## 2014-10-29 DIAGNOSIS — H52203 Unspecified astigmatism, bilateral: Secondary | ICD-10-CM | POA: Diagnosis not present

## 2015-01-07 DIAGNOSIS — L601 Onycholysis: Secondary | ICD-10-CM | POA: Diagnosis not present

## 2015-01-07 DIAGNOSIS — B351 Tinea unguium: Secondary | ICD-10-CM | POA: Diagnosis not present

## 2015-01-28 DIAGNOSIS — E039 Hypothyroidism, unspecified: Secondary | ICD-10-CM | POA: Diagnosis not present

## 2015-01-28 DIAGNOSIS — M810 Age-related osteoporosis without current pathological fracture: Secondary | ICD-10-CM | POA: Diagnosis not present

## 2015-01-28 DIAGNOSIS — Z1389 Encounter for screening for other disorder: Secondary | ICD-10-CM | POA: Diagnosis not present

## 2015-01-28 DIAGNOSIS — E780 Pure hypercholesterolemia: Secondary | ICD-10-CM | POA: Diagnosis not present

## 2015-01-28 DIAGNOSIS — Z0000 Encounter for general adult medical examination without abnormal findings: Secondary | ICD-10-CM | POA: Diagnosis not present

## 2015-01-28 DIAGNOSIS — Z79899 Other long term (current) drug therapy: Secondary | ICD-10-CM | POA: Diagnosis not present

## 2015-03-04 DIAGNOSIS — Z124 Encounter for screening for malignant neoplasm of cervix: Secondary | ICD-10-CM | POA: Diagnosis not present

## 2015-04-09 DIAGNOSIS — L601 Onycholysis: Secondary | ICD-10-CM | POA: Diagnosis not present

## 2015-04-09 DIAGNOSIS — L604 Beau's lines: Secondary | ICD-10-CM | POA: Diagnosis not present

## 2015-06-18 DIAGNOSIS — Z85820 Personal history of malignant melanoma of skin: Secondary | ICD-10-CM | POA: Diagnosis not present

## 2015-06-18 DIAGNOSIS — M720 Palmar fascial fibromatosis [Dupuytren]: Secondary | ICD-10-CM | POA: Diagnosis not present

## 2015-06-18 DIAGNOSIS — L814 Other melanin hyperpigmentation: Secondary | ICD-10-CM | POA: Diagnosis not present

## 2015-06-18 DIAGNOSIS — D225 Melanocytic nevi of trunk: Secondary | ICD-10-CM | POA: Diagnosis not present

## 2015-06-18 DIAGNOSIS — D2272 Melanocytic nevi of left lower limb, including hip: Secondary | ICD-10-CM | POA: Diagnosis not present

## 2015-06-18 DIAGNOSIS — L821 Other seborrheic keratosis: Secondary | ICD-10-CM | POA: Diagnosis not present

## 2015-06-18 DIAGNOSIS — D1801 Hemangioma of skin and subcutaneous tissue: Secondary | ICD-10-CM | POA: Diagnosis not present

## 2015-07-01 DIAGNOSIS — H5712 Ocular pain, left eye: Secondary | ICD-10-CM | POA: Diagnosis not present

## 2015-08-12 DIAGNOSIS — L259 Unspecified contact dermatitis, unspecified cause: Secondary | ICD-10-CM | POA: Diagnosis not present

## 2015-08-26 DIAGNOSIS — L853 Xerosis cutis: Secondary | ICD-10-CM | POA: Diagnosis not present

## 2015-08-26 DIAGNOSIS — L299 Pruritus, unspecified: Secondary | ICD-10-CM | POA: Diagnosis not present

## 2015-09-01 ENCOUNTER — Other Ambulatory Visit: Payer: Self-pay

## 2015-09-01 DIAGNOSIS — Z1231 Encounter for screening mammogram for malignant neoplasm of breast: Secondary | ICD-10-CM

## 2015-09-05 DIAGNOSIS — L309 Dermatitis, unspecified: Secondary | ICD-10-CM | POA: Diagnosis not present

## 2015-09-05 DIAGNOSIS — L308 Other specified dermatitis: Secondary | ICD-10-CM | POA: Diagnosis not present

## 2015-09-05 DIAGNOSIS — L2089 Other atopic dermatitis: Secondary | ICD-10-CM | POA: Diagnosis not present

## 2015-09-19 DIAGNOSIS — L2089 Other atopic dermatitis: Secondary | ICD-10-CM | POA: Diagnosis not present

## 2015-09-19 DIAGNOSIS — Z4802 Encounter for removal of sutures: Secondary | ICD-10-CM | POA: Diagnosis not present

## 2015-10-10 ENCOUNTER — Ambulatory Visit: Payer: Medicare Other

## 2015-10-14 ENCOUNTER — Ambulatory Visit
Admission: RE | Admit: 2015-10-14 | Discharge: 2015-10-14 | Disposition: A | Discharge status: Home / Self Care | Payer: Medicare Other | Source: Ambulatory Visit

## 2015-10-14 DIAGNOSIS — Z1231 Encounter for screening mammogram for malignant neoplasm of breast: Secondary | ICD-10-CM

## 2015-11-04 DIAGNOSIS — H52203 Unspecified astigmatism, bilateral: Secondary | ICD-10-CM | POA: Diagnosis not present

## 2015-11-04 DIAGNOSIS — H04123 Dry eye syndrome of bilateral lacrimal glands: Secondary | ICD-10-CM | POA: Diagnosis not present

## 2015-11-04 DIAGNOSIS — Z961 Presence of intraocular lens: Secondary | ICD-10-CM | POA: Diagnosis not present

## 2015-11-19 DIAGNOSIS — L259 Unspecified contact dermatitis, unspecified cause: Secondary | ICD-10-CM | POA: Diagnosis not present

## 2015-11-19 DIAGNOSIS — L503 Dermatographic urticaria: Secondary | ICD-10-CM | POA: Diagnosis not present

## 2015-12-30 DIAGNOSIS — L503 Dermatographic urticaria: Secondary | ICD-10-CM | POA: Diagnosis not present

## 2015-12-30 DIAGNOSIS — D692 Other nonthrombocytopenic purpura: Secondary | ICD-10-CM | POA: Diagnosis not present

## 2016-02-04 DIAGNOSIS — L239 Allergic contact dermatitis, unspecified cause: Secondary | ICD-10-CM | POA: Diagnosis not present

## 2016-02-04 DIAGNOSIS — B029 Zoster without complications: Secondary | ICD-10-CM | POA: Diagnosis not present

## 2016-02-10 DIAGNOSIS — Z1389 Encounter for screening for other disorder: Secondary | ICD-10-CM | POA: Diagnosis not present

## 2016-02-10 DIAGNOSIS — M720 Palmar fascial fibromatosis [Dupuytren]: Secondary | ICD-10-CM | POA: Diagnosis not present

## 2016-02-10 DIAGNOSIS — E039 Hypothyroidism, unspecified: Secondary | ICD-10-CM | POA: Diagnosis not present

## 2016-02-10 DIAGNOSIS — Z0000 Encounter for general adult medical examination without abnormal findings: Secondary | ICD-10-CM | POA: Diagnosis not present

## 2016-02-10 DIAGNOSIS — E7800 Pure hypercholesterolemia, unspecified: Secondary | ICD-10-CM | POA: Diagnosis not present

## 2016-02-10 DIAGNOSIS — Z79899 Other long term (current) drug therapy: Secondary | ICD-10-CM | POA: Diagnosis not present

## 2016-02-10 DIAGNOSIS — M810 Age-related osteoporosis without current pathological fracture: Secondary | ICD-10-CM | POA: Diagnosis not present

## 2016-03-11 DIAGNOSIS — M810 Age-related osteoporosis without current pathological fracture: Secondary | ICD-10-CM | POA: Diagnosis not present

## 2016-05-11 DIAGNOSIS — Z124 Encounter for screening for malignant neoplasm of cervix: Secondary | ICD-10-CM | POA: Diagnosis not present

## 2016-05-11 DIAGNOSIS — R197 Diarrhea, unspecified: Secondary | ICD-10-CM | POA: Diagnosis not present

## 2016-05-11 DIAGNOSIS — M8580 Other specified disorders of bone density and structure, unspecified site: Secondary | ICD-10-CM | POA: Diagnosis not present

## 2016-07-13 DIAGNOSIS — L821 Other seborrheic keratosis: Secondary | ICD-10-CM | POA: Diagnosis not present

## 2016-07-13 DIAGNOSIS — L708 Other acne: Secondary | ICD-10-CM | POA: Diagnosis not present

## 2016-07-13 DIAGNOSIS — Z808 Family history of malignant neoplasm of other organs or systems: Secondary | ICD-10-CM | POA: Diagnosis not present

## 2016-07-13 DIAGNOSIS — L601 Onycholysis: Secondary | ICD-10-CM | POA: Diagnosis not present

## 2016-07-13 DIAGNOSIS — Z85820 Personal history of malignant melanoma of skin: Secondary | ICD-10-CM | POA: Diagnosis not present

## 2016-07-13 DIAGNOSIS — D2272 Melanocytic nevi of left lower limb, including hip: Secondary | ICD-10-CM | POA: Diagnosis not present

## 2016-07-13 DIAGNOSIS — Z23 Encounter for immunization: Secondary | ICD-10-CM | POA: Diagnosis not present

## 2016-09-16 ENCOUNTER — Other Ambulatory Visit: Payer: Self-pay | Admitting: Geriatric Medicine

## 2016-09-16 DIAGNOSIS — Z1231 Encounter for screening mammogram for malignant neoplasm of breast: Secondary | ICD-10-CM

## 2016-10-19 ENCOUNTER — Ambulatory Visit
Admission: RE | Admit: 2016-10-19 | Discharge: 2016-10-19 | Disposition: A | Discharge status: Home / Self Care | Payer: Medicare Other | Source: Ambulatory Visit | Attending: Geriatric Medicine | Admitting: Geriatric Medicine

## 2016-10-19 DIAGNOSIS — Z1231 Encounter for screening mammogram for malignant neoplasm of breast: Secondary | ICD-10-CM

## 2016-10-20 ENCOUNTER — Telehealth: Payer: Self-pay | Admitting: Radiology

## 2016-11-09 DIAGNOSIS — H52203 Unspecified astigmatism, bilateral: Secondary | ICD-10-CM | POA: Diagnosis not present

## 2016-11-09 DIAGNOSIS — H04123 Dry eye syndrome of bilateral lacrimal glands: Secondary | ICD-10-CM | POA: Diagnosis not present

## 2016-11-09 DIAGNOSIS — Z961 Presence of intraocular lens: Secondary | ICD-10-CM | POA: Diagnosis not present

## 2017-02-16 DIAGNOSIS — R194 Change in bowel habit: Secondary | ICD-10-CM | POA: Diagnosis not present

## 2017-02-16 DIAGNOSIS — Z79899 Other long term (current) drug therapy: Secondary | ICD-10-CM | POA: Diagnosis not present

## 2017-02-16 DIAGNOSIS — Z1389 Encounter for screening for other disorder: Secondary | ICD-10-CM | POA: Diagnosis not present

## 2017-02-16 DIAGNOSIS — Z0000 Encounter for general adult medical examination without abnormal findings: Secondary | ICD-10-CM | POA: Diagnosis not present

## 2017-02-16 DIAGNOSIS — E039 Hypothyroidism, unspecified: Secondary | ICD-10-CM | POA: Diagnosis not present

## 2017-02-16 DIAGNOSIS — E7800 Pure hypercholesterolemia, unspecified: Secondary | ICD-10-CM | POA: Diagnosis not present

## 2017-02-16 DIAGNOSIS — R350 Frequency of micturition: Secondary | ICD-10-CM | POA: Diagnosis not present

## 2017-02-16 DIAGNOSIS — R030 Elevated blood-pressure reading, without diagnosis of hypertension: Secondary | ICD-10-CM | POA: Diagnosis not present

## 2017-04-12 DIAGNOSIS — Z23 Encounter for immunization: Secondary | ICD-10-CM | POA: Diagnosis not present

## 2017-04-19 ENCOUNTER — Observation Stay (HOSPITAL_COMMUNITY)
Admission: EM | Admit: 2017-04-19 | Discharge: 2017-04-20 | Disposition: A | Discharge status: Home / Self Care | Payer: Medicare Other | Attending: Family Medicine | Admitting: Family Medicine

## 2017-04-19 ENCOUNTER — Encounter (HOSPITAL_COMMUNITY): Payer: Self-pay

## 2017-04-19 ENCOUNTER — Emergency Department (HOSPITAL_COMMUNITY): Payer: Medicare Other

## 2017-04-19 DIAGNOSIS — Z8249 Family history of ischemic heart disease and other diseases of the circulatory system: Secondary | ICD-10-CM | POA: Insufficient documentation

## 2017-04-19 DIAGNOSIS — R002 Palpitations: Secondary | ICD-10-CM | POA: Insufficient documentation

## 2017-04-19 DIAGNOSIS — Z880 Allergy status to penicillin: Secondary | ICD-10-CM | POA: Diagnosis not present

## 2017-04-19 DIAGNOSIS — F419 Anxiety disorder, unspecified: Secondary | ICD-10-CM | POA: Insufficient documentation

## 2017-04-19 DIAGNOSIS — I7389 Other specified peripheral vascular diseases: Secondary | ICD-10-CM | POA: Diagnosis not present

## 2017-04-19 DIAGNOSIS — M79675 Pain in left toe(s): Secondary | ICD-10-CM

## 2017-04-19 DIAGNOSIS — Z7982 Long term (current) use of aspirin: Secondary | ICD-10-CM | POA: Insufficient documentation

## 2017-04-19 DIAGNOSIS — R55 Syncope and collapse: Secondary | ICD-10-CM | POA: Diagnosis not present

## 2017-04-19 DIAGNOSIS — I341 Nonrheumatic mitral (valve) prolapse: Secondary | ICD-10-CM | POA: Insufficient documentation

## 2017-04-19 DIAGNOSIS — I491 Atrial premature depolarization: Secondary | ICD-10-CM | POA: Diagnosis not present

## 2017-04-19 DIAGNOSIS — Z87891 Personal history of nicotine dependence: Secondary | ICD-10-CM | POA: Diagnosis not present

## 2017-04-19 DIAGNOSIS — Z882 Allergy status to sulfonamides status: Secondary | ICD-10-CM | POA: Insufficient documentation

## 2017-04-19 DIAGNOSIS — Z888 Allergy status to other drugs, medicaments and biological substances status: Secondary | ICD-10-CM | POA: Diagnosis not present

## 2017-04-19 DIAGNOSIS — S0011XA Contusion of right eyelid and periocular area, initial encounter: Secondary | ICD-10-CM | POA: Diagnosis not present

## 2017-04-19 DIAGNOSIS — R51 Headache: Secondary | ICD-10-CM | POA: Diagnosis not present

## 2017-04-19 DIAGNOSIS — E039 Hypothyroidism, unspecified: Secondary | ICD-10-CM | POA: Insufficient documentation

## 2017-04-19 DIAGNOSIS — Z79899 Other long term (current) drug therapy: Secondary | ICD-10-CM | POA: Insufficient documentation

## 2017-04-19 DIAGNOSIS — S0181XA Laceration without foreign body of other part of head, initial encounter: Secondary | ICD-10-CM | POA: Diagnosis not present

## 2017-04-19 DIAGNOSIS — E785 Hyperlipidemia, unspecified: Secondary | ICD-10-CM | POA: Insufficient documentation

## 2017-04-19 DIAGNOSIS — G3189 Other specified degenerative diseases of nervous system: Secondary | ICD-10-CM | POA: Insufficient documentation

## 2017-04-19 DIAGNOSIS — S0012XA Contusion of left eyelid and periocular area, initial encounter: Secondary | ICD-10-CM | POA: Diagnosis not present

## 2017-04-19 DIAGNOSIS — Z66 Do not resuscitate: Secondary | ICD-10-CM | POA: Diagnosis not present

## 2017-04-19 DIAGNOSIS — W19XXXA Unspecified fall, initial encounter: Secondary | ICD-10-CM | POA: Diagnosis not present

## 2017-04-19 DIAGNOSIS — S0990XA Unspecified injury of head, initial encounter: Secondary | ICD-10-CM | POA: Diagnosis not present

## 2017-04-19 LAB — BASIC METABOLIC PANEL
Anion gap: 8 (ref 5–15)
BUN: 15 mg/dL (ref 6–20)
CHLORIDE: 99 mmol/L — AB (ref 101–111)
CO2: 27 mmol/L (ref 22–32)
Calcium: 9.1 mg/dL (ref 8.9–10.3)
Creatinine, Ser: 0.68 mg/dL (ref 0.44–1.00)
GFR calc Af Amer: >60 mL/min (ref >60)
GLUCOSE: 109 mg/dL — AB (ref 65–99)
Potassium: 4.1 mmol/L (ref 3.5–5.1)
SODIUM: 134 mmol/L — AB (ref 135–145)

## 2017-04-19 LAB — CBC
HEMATOCRIT: 41.6 % (ref 36.0–46.0)
Hemoglobin: 14.4 g/dL (ref 12.0–15.0)
MCH: 31.3 pg (ref 26.0–34.0)
MCHC: 34.6 g/dL (ref 30.0–36.0)
MCV: 90.4 fL (ref 78.0–100.0)
PLATELETS: 285 10*3/uL (ref 150–400)
RBC: 4.6 MIL/uL (ref 3.87–5.11)
RDW: 14.3 % (ref 11.5–15.5)
WBC: 7.9 10*3/uL (ref 4.0–10.5)

## 2017-04-19 LAB — URINALYSIS, ROUTINE W REFLEX MICROSCOPIC
BILIRUBIN URINE: NEGATIVE
GLUCOSE, UA: NEGATIVE mg/dL
Hgb urine dipstick: NEGATIVE
KETONES UR: 5 mg/dL — AB
LEUKOCYTES UA: NEGATIVE
Nitrite: NEGATIVE
PH: 7 (ref 5.0–8.0)
PROTEIN: NEGATIVE mg/dL
Specific Gravity, Urine: 1.013 (ref 1.005–1.030)

## 2017-04-19 LAB — TROPONIN I: Troponin I: <0.03 ng/mL (ref <0.03)

## 2017-04-19 LAB — CBG MONITORING, ED: Glucose-Capillary: 100 mg/dL — ABNORMAL HIGH (ref 65–99)

## 2017-04-19 LAB — TSH: TSH: 1.221 u[IU]/mL (ref 0.350–4.500)

## 2017-04-19 MED ORDER — SIMVASTATIN 20 MG PO TABS
20.0000 mg | ORAL_TABLET | Freq: Every evening | ORAL | Status: DC
  Administered 2017-04-20: 20 mg via ORAL
  Filled 2017-04-19: qty 1

## 2017-04-19 MED ORDER — ENOXAPARIN SODIUM 40 MG/0.4ML ~~LOC~~ SOLN
40.0000 mg | SUBCUTANEOUS | Status: DC
  Administered 2017-04-19: 40 mg via SUBCUTANEOUS
  Filled 2017-04-19: qty 0.4

## 2017-04-19 MED ORDER — LEVOTHYROXINE SODIUM 50 MCG PO TABS
75.0000 ug | ORAL_TABLET | Freq: Every day | ORAL | Status: DC
  Administered 2017-04-20: 10:00:00 75 ug via ORAL
  Filled 2017-04-19: qty 1

## 2017-04-19 MED ORDER — ASPIRIN 81 MG PO CHEW
81.0000 mg | CHEWABLE_TABLET | Freq: Every day | ORAL | Status: DC
  Administered 2017-04-19 – 2017-04-20 (×2): 81 mg via ORAL
  Filled 2017-04-19 (×2): qty 1

## 2017-04-19 MED ORDER — NAPHAZOLINE-GLYCERIN 0.012-0.2 % OP SOLN
1.0000 [drp] | Freq: Four times a day (QID) | OPHTHALMIC | Status: DC | PRN
  Filled 2017-04-19: qty 15

## 2017-04-19 NOTE — ED Notes (Signed)
Bed: [facility] Expected date:  Expected time:  Means of arrival:  Comments: EMS-fall 

## 2017-04-19 NOTE — ED Notes (Addendum)
Pt. States she passed out and fail Sunday in the shower. Pt. States she crawled out but did not injure herself so did not tell anyone or call for help.

## 2017-04-19 NOTE — H&P (Signed)
History and Physical    Alaysiah Browder Kang YOV:[id number] DOB: [birth year] DOA: 04/19/2017  PCP: Lajean Manes, MD  Patient coming from: home  I have personally briefly reviewed patient's old medical records in [place]  Chief Complaint: syncope  HPI: Paige Monarrez Budge is Ja Ohman 83 y.o. female with medical history significant of anxiety, degenerative disk disease, palpitations, hypothyroidism presenting to [facility] with syncope.    She notes that the first episode occurred yesterday when she was in the shower. She passed out. She denies any prodrome. She denies any lightheadedness, chest pain, shortness of breath, dizziness, palpitations that occurred prior to the episode.  After fainting she landed on her shower for well then got up. She is unsure along she was passed out. She has recurrent episode this morning. This time she hit her head.  Again she had no lightheadedness, dizziness, chest pain, shortness of breath, or prodromewith this episode. She notes that she's had 5-6 episodes of syncope in the past 9 years.  She denies any fevers, chills, nausea vomiting She denies any tongue biting, incontinence.  ED Course: labs, EKG, head CT.  Review of Systems: As per HPI otherwise 10 point review of systems negative.    Past Medical History:  Diagnosis Date  . Anxiety   . Chronic headaches    history of  . DDD (degenerative disc disease) 05/24/2013  . Degenerative disc disease   . Dyslipidemia   . Headache(784.0): chronic 05/24/2013  . Heart palpitations   . Hypothyroidism   . Mitral valve prolapse   . MVP (mitral valve prolapse) 05/24/2013  . PAC (premature atrial contraction)   . Spinal stenosis   . TMJ syndrome     Past Surgical History:  Procedure Laterality Date  . APPENDECTOMY    . BREAST BIOPSY  3 times  . BREAST EXCISIONAL BIOPSY Right 1970   benign  . BREAST EXCISIONAL BIOPSY Left 1950   2 benign  . BREAST EXCISIONAL BIOPSY Right 1950   benign  . CHOLECYSTECTOMY    .  disectomy     micro lumbar  . TONSILLECTOMY       reports that she quit smoking about 43 years ago. Her smoking use included Cigarettes. She smoked 0.50 packs per day. She has never used smokeless tobacco. She reports that she drinks about 1.5 oz of alcohol per week . She reports that she does not use drugs.  Allergies  Allergen Reactions  . Acrylic Polymer Other (See Comments)    Blisters inside mouth   . Lipitor [Atorvastatin Calcium] Other (See Comments)    Myalgias and muscle aches   . Orudis Kt     Unknown   . Sulfa Antibiotics Itching  . Amoxicillin Itching and Rash    Family History  Problem Relation Age of Onset  . Heart failure Mother   . Stroke Mother   . Heart disease Father   . Melanoma Child     Prior to Admission medications   Medication Sig Start Date End Date Taking? Authorizing Provider  aspirin 81 MG tablet Take 81 mg by mouth daily.   Yes [provider]  Calcium Citrate (CITRACAL PO) Take 1 tablet by mouth daily.    Yes [provider]  cholecalciferol (VITAMIN D) 1000 UNITS tablet Take 1,000 Units by mouth daily.    Yes [provider]  co-enzyme Q-10 30 MG capsule Take 100 mg by mouth daily.    Yes [provider]  fish oil-omega-3 fatty acids  1000 MG capsule Take 2 capsules by mouth daily.    Yes [provider]  Multiple Vitamin (MULTIVITAMIN) capsule Take 1 capsule by mouth daily.   Yes [provider]  simvastatin (ZOCOR) 20 MG tablet Take 20 mg by mouth every evening.   Yes [provider]  Specialty Vitamins Products (MAGNESIUM, AMINO ACID CHELATE,) 133 MG tablet Take 1 tablet by mouth daily.    Yes [provider]  SYNTHROID 75 MCG tablet Take 75 mcg by mouth daily. 02/04/17  Yes [provider]  tetrahydrozoline (VISINE) 0.05 % ophthalmic solution Place 2 drops into both eyes daily as needed (For dry eyes).   Yes [provider]    Physical Exam: Vitals:    04/19/17 1400 04/19/17 1653 04/19/17 1906 04/19/17 1957  BP: (!) 130/55 (!) 130/53 (!) 138/58 120/65  Pulse: 68 69 69 71  Resp: 18 18 18 18   Temp:    98.7 F (37.1 C)  TempSrc:    Oral  SpO2: 96% 96% 95% 98%  Weight:    58.7 kg (129 lb 4.8 oz)  Height:    5' 4.5" (1.638 m)    Constitutional: NAD, calm, comfortable Vitals:   04/19/17 1400 04/19/17 1653 04/19/17 1906 04/19/17 1957  BP: (!) 130/55 (!) 130/53 (!) 138/58 120/65  Pulse: 68 69 69 71  Resp: 18 18 18 18   Temp:    98.7 F (37.1 C)  TempSrc:    Oral  SpO2: 96% 96% 95% 98%  Weight:    58.7 kg (129 lb 4.8 oz)  Height:    5' 4.5" (1.638 m)   Eyes: PERRL, lids and conjunctivae normal.  Ecchymoses to right eye.   ENMT: Mucous membranes are moist. Posterior pharynx clear of any exudate or lesions.Normal dentition.  Neck: normal, supple, no masses, no thyromegaly Respiratory: clear to auscultation bilaterally, no wheezing, no crackles. Normal respiratory effort. No accessory muscle use.  Cardiovascular: Regular rate and rhythm, no murmurs / rubs / gallops. No extremity edema. 2+ pedal pulses.  Abdomen: no tenderness, no masses palpated. No hepatosplenomegaly. Bowel sounds positive.  Musculoskeletal: no clubbing / cyanosis. No joint deformity upper and lower extremities. Good ROM, no contractures. Normal muscle tone.  Skin: laceration to right side of the scalp. Neurologic: CN 2-12 grossly intact. Sensation intact, DTR normal. Strength 5/5 in all 4.  Psychiatric: Normal judgment and insight. Alert and oriented x 3. Normal mood.   Labs on Admission: I have personally reviewed following labs and imaging studies  CBC:  Recent Labs Lab 04/19/17 1224  WBC 7.9  HGB 14.4  HCT 41.6  MCV 90.4  PLT 563   Basic Metabolic Panel:  Recent Labs Lab 04/19/17 1224  NA 134*  K 4.1  CL 99*  CO2 27  GLUCOSE 109*  BUN 15  CREATININE 0.68  CALCIUM 9.1   GFR: Estimated Creatinine Clearance: 46.2 mL/min (by C-G formula based on  SCr of 0.68 mg/dL). Liver Function Tests: No results for input(s): AST, ALT, ALKPHOS, BILITOT, PROT, ALBUMIN in the last 168 hours. No results for input(s): LIPASE, AMYLASE in the last 168 hours. No results for input(s): AMMONIA in the last 168 hours. Coagulation Profile: No results for input(s): INR, PROTIME in the last 168 hours. Cardiac Enzymes:  Recent Labs Lab 04/19/17 2009  TROPONINI <0.03   BNP (last 3 results) No results for input(s): PROBNP in the last 8760 hours. HbA1C: No results for input(s): HGBA1C in the last 72 hours. CBG:  Recent Labs  Lab 04/19/17 1306  GLUCAP 100*   Lipid Profile: No results for input(s): CHOL, HDL, LDLCALC, TRIG, CHOLHDL, LDLDIRECT in the last 72 hours. Thyroid Function Tests:  Recent Labs  04/19/17 2009  TSH 1.221   Anemia Panel: No results for input(s): VITAMINB12, FOLATE, FERRITIN, TIBC, IRON, RETICCTPCT in the last 72 hours. Urine analysis:    Component Value Date/Time   COLORURINE YELLOW 04/19/2017 1545   APPEARANCEUR CLOUDY (Demauri Advincula) 04/19/2017 1545   LABSPEC 1.013 04/19/2017 1545   PHURINE 7.0 04/19/2017 1545   GLUCOSEU NEGATIVE 04/19/2017 1545   HGBUR NEGATIVE 04/19/2017 1545   BILIRUBINUR NEGATIVE 04/19/2017 1545   KETONESUR 5 (Raisha Brabender) 04/19/2017 1545   PROTEINUR NEGATIVE 04/19/2017 1545   UROBILINOGEN 0.2 09/20/2013 0859   NITRITE NEGATIVE 04/19/2017 1545   LEUKOCYTESUR NEGATIVE 04/19/2017 1545    Radiological Exams on Admission: Ct Head Wo Contrast  Result Date: 04/19/2017 CLINICAL DATA:  Syncope with fall.  Headache. EXAM: CT HEAD WITHOUT CONTRAST TECHNIQUE: Contiguous axial images were obtained from the base of the skull through the vertex without intravenous contrast. COMPARISON:  Head CT May 24, 2013 and brain MRI May 24, 2013 FINDINGS: Brain: Mild diffuse atrophy is stable. There is no intracranial mass, hemorrhage, extra-axial fluid collection, or midline shift. Mild patchy small vessel disease in the centra  semiovale bilaterally is stable. No new gray-white compartment lesion. No acute infarct evident. Vascular: No hyperdense vessel. There is mild calcification in each carotid siphon region. There is calcification in the distal vertebral arteries bilaterally. Skull: Bony calvarium appears intact. Sinuses/Orbits: Visualized paranasal sinuses are clear. Orbits appear symmetric bilaterally with apparent scleral banding. Other: Mastoid air cells are clear. IMPRESSION: Stable atrophy with mild periventricular small vessel disease. No intracranial mass, hemorrhage, or extra-axial fluid collection. No evident acute infarct. Foci of arterial vascular calcification are noted. Electronically Signed   By: Lowella Grip III M.D.   On: 04/19/2017 14:42    EKG: Independently reviewed. Appears similar to priors, but appears to have new q in V2.  Normal QT.  Assessment/Plan Active Problems:   Syncope   Syncope:  Lack of prodrome concerning for cardiac etiology.  Differential also includes orthostatic, vasovagal. less likely seizure based on history.  EKG generally similar to priors, but with new Q in V2.  Head CT with stable atrophy with mild periventicular small vessel disease.  We will admit for syncope workup. Orthostatic blood pressures Echocardiogram Telemetry I sent message to cards master to set up cardiac event monitor  Normal TSH Negative troponin  Hypothyroidism: synthroid HLD: simvastatin   DVT prophylaxis: lovenox  Code Status: DNR  Family Communication: none  Disposition Plan: home  Consults called: none  Admission status: tele    Fayrene Helper MD [facility] Pager [phone]   If 7PM-7AM, please contact night-coverage [email or web address] Password [email or web address]  04/19/2017, 11:00 PM

## 2017-04-19 NOTE — ED Notes (Signed)
Please call report to John at [phone] at [place].

## 2017-04-19 NOTE — ED Provider Notes (Signed)
[place] DEPT Provider Note   CSN: [id number] Arrival date & time: 04/19/17  1146     History   Chief Complaint Chief Complaint  Patient presents with  . Fall    HPI Laurie Klein is a 83 y.o. female.  HPI  Patient comes in with chief complaint of syncope. Patient has history of palpitations, mitral wall prolapse, spinal stenosis. Patient reports that on Sunday and then again today she had a syncopal episode. On both occasions patient was actively doing something and the next thing she recalls was waking up on the floor. Patient denies any chest pain, shortness of breath, palpitations, dizziness associated with her symptoms. Patient has had about 5 episodes of syncope in the last 5 years. She denies any history of congestive heart failure, PE, blood loss, strokes. Pt has no hx of PE, DVT and denies any exogenous hormone (testosterone / estrogen) use, long distance travels or surgery in the past 6 weeks, active cancer, recent immobilization.   Past Medical History:  Diagnosis Date  . Anxiety   . Chronic headaches    history of  . DDD (degenerative disc disease) 05/24/2013  . Degenerative disc disease   . Dyslipidemia   . Headache(784.0): chronic 05/24/2013  . Heart palpitations   . Hypothyroidism   . Mitral valve prolapse   . MVP (mitral valve prolapse) 05/24/2013  . PAC (premature atrial contraction)   . Spinal stenosis   . TMJ syndrome     Patient Active Problem List   Diagnosis Date Noted  . Syncope and collapse 05/24/2013  . MVP (mitral valve prolapse) 05/24/2013  . Anxiety 05/24/2013  . Dyslipidemia 05/24/2013  . Hypothyroidism 05/24/2013  . Headache(784.0): chronic 05/24/2013  . Spinal stenosis 05/24/2013  . PAC (premature atrial contraction) 05/24/2013  . DDD (degenerative disc disease) 05/24/2013    Past Surgical History:  Procedure Laterality Date  . APPENDECTOMY    . BREAST BIOPSY  3 times  . BREAST EXCISIONAL  BIOPSY Right 1970   benign  . BREAST EXCISIONAL BIOPSY Left 1950   2 benign  . BREAST EXCISIONAL BIOPSY Right 1950   benign  . CHOLECYSTECTOMY    . disectomy     micro lumbar  . TONSILLECTOMY      OB History    No data available       Home Medications    Prior to Admission medications   Medication Sig Start Date End Date Taking? Authorizing Provider  aspirin 81 MG tablet Take 81 mg by mouth daily.   Yes [provider]  Calcium Citrate (CITRACAL PO) Take 1 tablet by mouth daily.    Yes [provider]  cholecalciferol (VITAMIN D) 1000 UNITS tablet Take 1,000 Units by mouth daily.    Yes [provider]  co-enzyme Q-10 30 MG capsule Take 100 mg by mouth daily.    Yes [provider]  fish oil-omega-3 fatty acids 1000 MG capsule Take 2 capsules by mouth daily.    Yes [provider]  Multiple Vitamin (MULTIVITAMIN) capsule Take 1 capsule by mouth daily.   Yes [provider]  simvastatin (ZOCOR) 20 MG tablet Take 20 mg by mouth every evening.   Yes [provider]  Specialty Vitamins Products (MAGNESIUM, AMINO ACID CHELATE,) 133 MG tablet Take 1 tablet by mouth daily.    Yes [provider]  SYNTHROID 75 MCG tablet Take 75 mcg by mouth daily. 02/04/17  Yes [provider]  tetrahydrozoline Karma Lew)  0.05 % ophthalmic solution Place 2 drops into both eyes daily as needed (For dry eyes).   Yes [provider]    Family History Family History  Problem Relation Age of Onset  . Heart failure Mother   . Stroke Mother   . Heart disease Father   . Melanoma Child     Social History Social History  Substance Use Topics  . Smoking status: Former Smoker    Packs/day: 0.50    Types: Cigarettes    Quit date: 10/03/1973  . Smokeless tobacco: Never Used  . Alcohol use 1.5 oz/week    3 drink(s) per week     Comment: 4 glasses wine per week     Allergies   Acrylic polymer; Lipitor [atorvastatin  calcium]; Orudis kt; Sulfa antibiotics; and Amoxicillin   Review of Systems Review of Systems  Constitutional: Negative for activity change.  Respiratory: Negative for shortness of breath.   Cardiovascular: Negative for chest pain and palpitations.  Gastrointestinal: Negative for abdominal pain.  Neurological: Positive for syncope.     Physical Exam Updated Vital Signs BP (!) 130/55 (BP Location: Right Arm)   Pulse 68   Resp 18   SpO2 96%   Physical Exam  Constitutional: She is oriented to person, place, and time. She appears well-developed.  HENT:  Head: Normocephalic.  Eyes: Pupils are equal, round, and reactive to light. EOM are normal.  Periorbital ecchymosis on the right side, no crepitus, gross vision exam is normal  Neck: Normal range of motion. Neck supple.  No midline c-spine tenderness, pt able to turn head to 45 degrees bilaterally without any pain and able to flex neck to the chest and extend without any pain or neurologic symptoms.   Cardiovascular: Normal rate.   Pulmonary/Chest: Effort normal.  Abdominal: Bowel sounds are normal.  Neurological: She is alert and oriented to person, place, and time.  Skin: Skin is warm and dry.  Nursing note and vitals reviewed.    ED Treatments / Results  Labs (all labs ordered are listed, but only abnormal results are displayed) Labs Reviewed  BASIC METABOLIC PANEL - Abnormal; Notable for the following:       Result Value   Sodium 134 (*)    Chloride 99 (*)    Glucose, Bld 109 (*)    All other components within normal limits  URINALYSIS, ROUTINE W REFLEX MICROSCOPIC - Abnormal; Notable for the following:    APPearance CLOUDY (*)    Ketones, ur 5 (*)    All other components within normal limits  CBG MONITORING, ED - Abnormal; Notable for the following:    Glucose-Capillary 100 (*)    All other components within normal limits  CBC    EKG  EKG Interpretation  Date/Time:  Tuesday April 19 2017 12:35:35  EDT Ventricular Rate:  77 PR Interval:    QRS Duration: 74 QT Interval:  394 QTC Calculation: 446 R Axis:   8 Text Interpretation:  Sinus rhythm Anteroseptal infarct, age indeterminate No acute changes No significant change since last tracing Confirmed by Varney Biles [phone]) on 04/19/2017 1:07:33 PM       Radiology Ct Head Wo Contrast  Result Date: 04/19/2017 CLINICAL DATA:  Syncope with fall.  Headache. EXAM: CT HEAD WITHOUT CONTRAST TECHNIQUE: Contiguous axial images were obtained from the base of the skull through the vertex without intravenous contrast. COMPARISON:  Head CT May 24, 2013 and brain MRI May 24, 2013 FINDINGS: Brain: Mild diffuse atrophy is stable.  There is no intracranial mass, hemorrhage, extra-axial fluid collection, or midline shift. Mild patchy small vessel disease in the centra semiovale bilaterally is stable. No new gray-white compartment lesion. No acute infarct evident. Vascular: No hyperdense vessel. There is mild calcification in each carotid siphon region. There is calcification in the distal vertebral arteries bilaterally. Skull: Bony calvarium appears intact. Sinuses/Orbits: Visualized paranasal sinuses are clear. Orbits appear symmetric bilaterally with apparent scleral banding. Other: Mastoid air cells are clear. IMPRESSION: Stable atrophy with mild periventricular small vessel disease. No intracranial mass, hemorrhage, or extra-axial fluid collection. No evident acute infarct. Foci of arterial vascular calcification are noted. Electronically Signed   By: Lowella Grip III M.D.   On: 04/19/2017 14:42    Procedures Procedures (including critical care time)  Medications Ordered in ED Medications - No data to display   Initial Impression / Assessment and Plan / ED Course  I have reviewed the triage vital signs and the nursing notes.  Pertinent labs & imaging results that were available during my care of the patient were reviewed by me and  considered in my medical decision making (see chart for details).     DDx includes: Orthostatic hypotension Stroke Vertebral artery dissection/stenosis Dysrhythmia PE Vasovagal/neurocardiogenic syncope Aortic stenosis Valvular disorder/Cardiomyopathy Anemia  Patient comes in with chief complaint of syncope. Patient had 2 episodes of syncope over the past 2 days. Patient had no prodrome prior to the syncopal episodes. Patient has no PE risk factors. Although patient has no history of congestive heart failure, she does have a history of valvular pathology and she had syncopal episode without prodrome which is concerning. We will admit patient for syncope workup.  From the fall itself patient has. Orbital ecchymosis on the right side. Her gross visual acuity at bedside is normal, there is no high pain or redness, extraocular muscles are intact and pupils are equal - see any indication for CT scan of her face. C spine cleared clinically, CT head ordered.   Final Clinical Impressions(s) / ED Diagnoses   Final diagnoses:  Syncope and collapse  Contusion of right periocular region, initial encounter    New Prescriptions New Prescriptions   No medications on file     Varney Biles, MD 04/19/17 [phone]

## 2017-04-19 NOTE — ED Triage Notes (Signed)
Pt arrived via EMS from [place] facility. Pt. Sates she passed out and fail around 0720 this morning and got up and went back to bed. Pt states she got a headache around 1120. Pt has small laceration to right side of head, right eye swollen, A/OX4, no blood thinners, neuro intact per EMS. VS- 142/62, P-74, RR-18, CBG 105, pain 2-3 in head, no n/v or dizziness per EMS.

## 2017-04-19 NOTE — ED Notes (Signed)
Attempted to give report. Nurse is still in report. Will call back in 5 min.

## 2017-04-19 NOTE — ED Notes (Signed)
Cleaned pt scalp. Small laceration to right of head noted.

## 2017-04-20 ENCOUNTER — Other Ambulatory Visit: Payer: Self-pay | Admitting: Cardiology

## 2017-04-20 ENCOUNTER — Observation Stay (HOSPITAL_COMMUNITY): Payer: Medicare Other

## 2017-04-20 ENCOUNTER — Observation Stay (HOSPITAL_BASED_OUTPATIENT_CLINIC_OR_DEPARTMENT_OTHER): Payer: Medicare Other

## 2017-04-20 DIAGNOSIS — S0011XA Contusion of right eyelid and periocular area, initial encounter: Secondary | ICD-10-CM | POA: Diagnosis not present

## 2017-04-20 DIAGNOSIS — R55 Syncope and collapse: Secondary | ICD-10-CM | POA: Diagnosis not present

## 2017-04-20 DIAGNOSIS — I5030 Unspecified diastolic (congestive) heart failure: Secondary | ICD-10-CM | POA: Diagnosis not present

## 2017-04-20 DIAGNOSIS — M79675 Pain in left toe(s): Secondary | ICD-10-CM

## 2017-04-20 DIAGNOSIS — S99922A Unspecified injury of left foot, initial encounter: Secondary | ICD-10-CM | POA: Diagnosis not present

## 2017-04-20 LAB — BASIC METABOLIC PANEL
Anion gap: 9 (ref 5–15)
BUN: 16 mg/dL (ref 6–20)
CHLORIDE: 99 mmol/L — AB (ref 101–111)
CO2: 25 mmol/L (ref 22–32)
Calcium: 8.7 mg/dL — ABNORMAL LOW (ref 8.9–10.3)
Creatinine, Ser: 0.66 mg/dL (ref 0.44–1.00)
Glucose, Bld: 94 mg/dL (ref 65–99)
Potassium: 4 mmol/L (ref 3.5–5.1)
SODIUM: 133 mmol/L — AB (ref 135–145)

## 2017-04-20 LAB — ECHOCARDIOGRAM COMPLETE
HEIGHTINCHES: 64.5 in
Weight: 2068.8 oz

## 2017-04-20 LAB — CBC
HEMATOCRIT: 38.6 % (ref 36.0–46.0)
HEMOGLOBIN: 13.3 g/dL (ref 12.0–15.0)
MCH: 30.9 pg (ref 26.0–34.0)
MCHC: 34.5 g/dL (ref 30.0–36.0)
MCV: 89.8 fL (ref 78.0–100.0)
Platelets: 251 10*3/uL (ref 150–400)
RBC: 4.3 MIL/uL (ref 3.87–5.11)
RDW: 14.5 % (ref 11.5–15.5)
WBC: 6.9 10*3/uL (ref 4.0–10.5)

## 2017-04-20 NOTE — Discharge Summary (Signed)
Physician Discharge Summary  Laurie Klein Wisdom ZOX:[id number] DOB: 1930-11-01 DOA: 04/19/2017  PCP: Laurie Manes, MD  Admit date: 04/19/2017 Discharge date: 04/20/2017  Admitted From: Home (ILF) Disposition: Home (ILF)   Recommendations for Outpatient Follow-up:  1. Follow up with PCP in 1-2 weeks 2. Follow up with cardiology for outpatient cardiac monitoring for syncope.   Home Health: None Equipment/Devices: None Discharge Condition: Stable CODE STATUS: DNR Diet recommendation: Regular  Brief/Interim Summary: Laurie Klein is a 83 y.o. female with medical history significant of anxiety, degenerative disk disease, palpitations, hypothyroidism presenting to [facility] with syncope.    She notes that the first episode occurred yesterday when she was in the shower. She passed out. She denies any prodrome. She denies any lightheadedness, chest pain, shortness of breath, dizziness, palpitations that occurred prior to the episode.  After fainting she landed on her shower for well then got up. She is unsure along she was passed out. She has recurrent episode this morning. This time she hit her head.  Again she had no lightheadedness, dizziness, chest pain, shortness of breath, or prodromewith this episode. She notes that she's had 5-6 episodes of syncope in the past 9 years.  She denies any fevers, chills, nausea vomiting She denies any tongue biting, incontinence.  ED Course: labs, EKG, head CT.  Hospital course: No telemetric events were noted and no further syncope occurred. Echocardiogram showed no significant valvular abnormalities or wall motion abnormalities.   Discharge Diagnoses:  Active Problems:   Syncope   Contusion of right periocular region   Great toe pain, left  Syncope: No prodrome, rapid onset/offset. Head CT with stable atrophy with mild periventicular small vessel disease. Negative orthostatic vital signs, no telemetry events on review 10/17.  - Discussed with cards master, who  has confirmed that the order is placed for outpatient cardiac monitoring follow precertification. Pt will be expecting a call.  - Made pt aware of restrictions on driving for at least 6 months, and other precautions (e.g. ladders, swimming).   Hypothyroidism: TSH wnl. Continue synthroid HLD: Chronic, stable. Continue simvastatin  Left great toe pain: Without swelling, erythema, noted after syncopal episode. XR without fracture or dislocation. Recommended supportive care.   Discharge Instructions Discharge Instructions    Discharge instructions    Complete by:  As directed    You were admitted with fainting (syncope) and no definite cause has been found so far. The next step is to have prolonged cardiac monitoring, which will be arranged through our cardiology office. They will contact you to schedule an appointment after they have receive precertification through your insurance. If you do not hear from them soon, you may call [phone].   It is important that you take precautions to keep yourself safe because these attacks occur without warning. You should not be taking part in any activity that could cause harm to yourself or others were an attack to happen. This includes no driving, no climbing on ladders or swimming for at least 6 months.   Increase activity slowly    Complete by:  As directed      Allergies as of 04/20/2017      Reactions   Acrylic Polymer Other (See Comments)   Blisters inside mouth    Lipitor [atorvastatin Calcium] Other (See Comments)   Myalgias and muscle aches    Orudis Kt    Unknown    Sulfa Antibiotics Itching   Amoxicillin Itching, Rash      Medication List  TAKE these medications   aspirin 81 MG tablet Take 81 mg by mouth daily.   cholecalciferol 1000 units tablet Commonly known as:  VITAMIN D Take 1,000 Units by mouth daily.   CITRACAL PO Take 1 tablet by mouth daily.   co-enzyme Q-10 30 MG capsule Take 100 mg by mouth daily.   fish  oil-omega-3 fatty acids 1000 MG capsule Take 2 capsules by mouth daily.   magnesium (amino acid chelate) 133 MG tablet Take 1 tablet by mouth daily.   multivitamin capsule Take 1 capsule by mouth daily.   simvastatin 20 MG tablet Commonly known as:  ZOCOR Take 20 mg by mouth every evening.   SYNTHROID 75 MCG tablet Generic drug:  levothyroxine Take 75 mcg by mouth daily.   VISINE 0.05 % ophthalmic solution Generic drug:  tetrahydrozoline Place 2 drops into both eyes daily as needed (For dry eyes).      Follow-up Information    Laurie Manes, MD Follow up.   Specialty:  Internal Medicine Contact information: [address] [place] [address] [phone]          Allergies  Allergen Reactions  . Acrylic Polymer Other (See Comments)    Blisters inside mouth   . Lipitor [Atorvastatin Calcium] Other (See Comments)    Myalgias and muscle aches   . Orudis Kt     Unknown   . Sulfa Antibiotics Itching  . Amoxicillin Itching and Rash    Consultations:  Cardiology by phone, will follow up for outpatient monitoring.   Procedures/Studies: Ct Head Wo Contrast  Result Date: 04/19/2017 CLINICAL DATA:  Syncope with fall.  Headache. EXAM: CT HEAD WITHOUT CONTRAST TECHNIQUE: Contiguous axial images were obtained from the base of the skull through the vertex without intravenous contrast. COMPARISON:  Head CT May 24, 2013 and brain MRI May 24, 2013 FINDINGS: Brain: Mild diffuse atrophy is stable. There is no intracranial mass, hemorrhage, extra-axial fluid collection, or midline shift. Mild patchy small vessel disease in the centra semiovale bilaterally is stable. No new gray-white compartment lesion. No acute infarct evident. Vascular: No hyperdense vessel. There is mild calcification in each carotid siphon region. There is calcification in the distal vertebral arteries bilaterally. Skull: Bony calvarium appears intact. Sinuses/Orbits: Visualized  paranasal sinuses are clear. Orbits appear symmetric bilaterally with apparent scleral banding. Other: Mastoid air cells are clear. IMPRESSION: Stable atrophy with mild periventricular small vessel disease. No intracranial mass, hemorrhage, or extra-axial fluid collection. No evident acute infarct. Foci of arterial vascular calcification are noted. Electronically Signed   By: Lowella Grip III M.D.   On: 04/19/2017 14:42   Dg Toe Great Left  Result Date: 04/20/2017 CLINICAL DATA:  Left great toe pain for the past 2 days. No known injury. None in PACs EXAM: LEFT GREAT TOE COMPARISON:  None in PACs FINDINGS: The bones are subjectively adequately mineralized. There is no acute fracture or dislocation. The joint spaces are reasonably well-maintained. There are no erosive changes. The soft tissues are unremarkable. IMPRESSION: There is no acute or significant chronic bony abnormality of the left great toe. Electronically Signed   By: David  Martinique M.D.   On: 04/20/2017 15:03   Echocardiogram 10/17:  - Left ventricle: The cavity size was normal. Wall thickness was   normal. Systolic function was normal. The estimated ejection   fraction was in the range of 60% to 65%. Wall motion was normal;   there were no regional wall motion abnormalities. Doppler  parameters are consistent with abnormal left ventricular   relaxation (grade 1 diastolic dysfunction). - Aortic valve: There was trivial regurgitation.  Impressions: - Normal LV systolic function; mild diastolic dysfunction; trace   AI, MR and TR.  Subjective: Feels well. Right eye without pain or abnormal vision. Left great toe with tenderness on ambulation without swelling.   Discharge Exam: Vitals:   04/20/17 0603 04/20/17 1459  BP: (!) 117/58 (!) 114/55  Pulse: 73 63  Resp: 18 18  Temp: 98.2 F (36.8 C) 97.8 F (36.6 C)  SpO2: 97% 97%   General: Pt is alert, awake, not in acute distress HEENT: Small laceration with hemostatic  eschar on right frontal/temporal scalp. Periorbital ecchymosis on right eye without restricted ROM or pain or diminished visual acuity.  Cardiovascular: RRR, S1/S2 +, no rubs, no gallops. No murmur Respiratory: CTA bilaterally, no wheezing, no rhonchi Abdominal: Soft, NT, ND, bowel sounds + Extremities: No edema, no cyanosis  Labs: Basic Metabolic Panel:  Recent Labs Lab 04/19/17 1224 04/20/17 0427  NA 134* 133*  K 4.1 4.0  CL 99* 99*  CO2 27 25  GLUCOSE 109* 94  BUN 15 16  CREATININE 0.68 0.66  CALCIUM 9.1 8.7*   CBC:  Recent Labs Lab 04/19/17 1224 04/20/17 0427  WBC 7.9 6.9  HGB 14.4 13.3  HCT 41.6 38.6  MCV 90.4 89.8  PLT 285 251   Cardiac Enzymes:  Recent Labs Lab 04/19/17 2009  TROPONINI <0.03   CBG:  Recent Labs Lab 04/19/17 1306  GLUCAP 100*   Thyroid function studies  Recent Labs  04/19/17 2009  TSH 1.221   Urinalysis    Component Value Date/Time   COLORURINE YELLOW 04/19/2017 1545   APPEARANCEUR CLOUDY (A) 04/19/2017 1545   LABSPEC 1.013 04/19/2017 1545   PHURINE 7.0 04/19/2017 1545   GLUCOSEU NEGATIVE 04/19/2017 1545   HGBUR NEGATIVE 04/19/2017 1545   BILIRUBINUR NEGATIVE 04/19/2017 1545   KETONESUR 5 (A) 04/19/2017 1545   PROTEINUR NEGATIVE 04/19/2017 1545   UROBILINOGEN 0.2 09/20/2013 0859   NITRITE NEGATIVE 04/19/2017 1545   LEUKOCYTESUR NEGATIVE 04/19/2017 1545    Time coordinating discharge: Approximately 40 minutes  Vance Gather, MD  [facility] 04/20/2017, 6:38 PM Pager [phone]

## 2017-04-20 NOTE — Progress Notes (Signed)
[facility]/ALF use [facility] at home for HHPT will continue to follow.

## 2017-04-20 NOTE — Progress Notes (Signed)
2D Echocardiogram has been performed.  Laurie Klein 04/20/2017, 9:53 AM

## 2017-04-20 NOTE — Discharge Instructions (Signed)
Your x-ray was negative for fracture of the big toe. The pain will resolve on its own over time. Apply ice as needed for pain relief and bear weight as tolerated.    Syncope Syncope is when you temporarily lose consciousness. Syncope may also be called fainting or passing out. It is caused by a sudden decrease in blood flow to the brain. Even though most causes of syncope are not dangerous, syncope can be a sign of a serious medical problem. Signs that you may be about to faint include:  Feeling dizzy or light-headed.  Feeling nauseous.  Seeing all white or all black in your field of vision.  Having cold, clammy skin.  If you fainted, get medical help right away.Call your local emergency services (911 in the [place] Do not drive yourself to the hospital. Follow these instructions at home: Pay attention to any changes in your symptoms. Take these actions to help with your condition:  Have someone stay with you until you feel stable.  Do not drive, use machinery, or play sports until your health care provider says it is okay.  Keep all follow-up visits as told by your health care provider. This is important.  If you start to feel like you might faint, lie down right away and raise (elevate) your feet above the level of your heart. Breathe deeply and steadily. Wait until all of the symptoms have passed.  Drink enough fluid to keep your urine clear or pale yellow.  If you are taking blood pressure or heart medicine, get up slowly and take several minutes to sit and then stand. This can reduce dizziness.  Take over-the-counter and prescription medicines only as told by your health care provider.  Get help right away if:  You have a severe headache.  You have unusual pain in your chest, abdomen, or back.  You are bleeding from your mouth or rectum, or you have black or tarry stool.  You have a very fast or irregular heartbeat (palpitations).  You have pain with breathing.  You  faint once or repeatedly.  You have a seizure.  You are confused.  You have trouble walking.  You have severe weakness.  You have vision problems. These symptoms may represent a serious problem that is an emergency. Do not wait to see if your symptoms will go away. Get medical help right away. Call your local emergency services (911 in the [place] Do not drive yourself to the hospital. This information is not intended to replace advice given to you by your health care provider. Make sure you discuss any questions you have with your health care provider. Document Released: 06/21/2005 Document Revised: 11/27/2015 Document Reviewed: 03/05/2015 Elsevier Interactive Patient Education  2017 [employer].

## 2017-04-26 DIAGNOSIS — R55 Syncope and collapse: Secondary | ICD-10-CM | POA: Diagnosis not present

## 2017-05-06 ENCOUNTER — Ambulatory Visit (INDEPENDENT_AMBULATORY_CARE_PROVIDER_SITE_OTHER): Payer: Medicare Other

## 2017-05-06 DIAGNOSIS — R55 Syncope and collapse: Secondary | ICD-10-CM | POA: Diagnosis not present

## 2017-05-10 DIAGNOSIS — N3941 Urge incontinence: Secondary | ICD-10-CM | POA: Diagnosis not present

## 2017-05-10 DIAGNOSIS — Z01411 Encounter for gynecological examination (general) (routine) with abnormal findings: Secondary | ICD-10-CM | POA: Diagnosis not present

## 2017-07-13 DIAGNOSIS — Z85820 Personal history of malignant melanoma of skin: Secondary | ICD-10-CM | POA: Diagnosis not present

## 2017-07-13 DIAGNOSIS — L603 Nail dystrophy: Secondary | ICD-10-CM | POA: Diagnosis not present

## 2017-07-13 DIAGNOSIS — L708 Other acne: Secondary | ICD-10-CM | POA: Diagnosis not present

## 2017-07-13 DIAGNOSIS — D225 Melanocytic nevi of trunk: Secondary | ICD-10-CM | POA: Diagnosis not present

## 2017-07-13 DIAGNOSIS — L821 Other seborrheic keratosis: Secondary | ICD-10-CM | POA: Diagnosis not present

## 2017-07-13 DIAGNOSIS — Z808 Family history of malignant neoplasm of other organs or systems: Secondary | ICD-10-CM | POA: Diagnosis not present

## 2017-07-13 DIAGNOSIS — L814 Other melanin hyperpigmentation: Secondary | ICD-10-CM | POA: Diagnosis not present

## 2017-07-13 DIAGNOSIS — D2272 Melanocytic nevi of left lower limb, including hip: Secondary | ICD-10-CM | POA: Diagnosis not present

## 2017-07-13 DIAGNOSIS — D1801 Hemangioma of skin and subcutaneous tissue: Secondary | ICD-10-CM | POA: Diagnosis not present

## 2017-07-13 DIAGNOSIS — Z23 Encounter for immunization: Secondary | ICD-10-CM | POA: Diagnosis not present

## 2017-08-10 DIAGNOSIS — H04221 Epiphora due to insufficient drainage, right lacrimal gland: Secondary | ICD-10-CM | POA: Diagnosis not present

## 2017-08-12 ENCOUNTER — Other Ambulatory Visit: Payer: Self-pay | Admitting: Geriatric Medicine

## 2017-08-12 DIAGNOSIS — F0280 Dementia in other diseases classified elsewhere without behavioral disturbance: Secondary | ICD-10-CM | POA: Diagnosis not present

## 2017-08-12 DIAGNOSIS — E039 Hypothyroidism, unspecified: Secondary | ICD-10-CM | POA: Diagnosis not present

## 2017-08-12 DIAGNOSIS — R413 Other amnesia: Secondary | ICD-10-CM | POA: Diagnosis not present

## 2017-08-12 DIAGNOSIS — G301 Alzheimer's disease with late onset: Secondary | ICD-10-CM | POA: Diagnosis not present

## 2017-08-12 DIAGNOSIS — Z79899 Other long term (current) drug therapy: Secondary | ICD-10-CM | POA: Diagnosis not present

## 2017-08-18 ENCOUNTER — Ambulatory Visit
Admission: RE | Admit: 2017-08-18 | Discharge: 2017-08-18 | Disposition: A | Discharge status: Home / Self Care | Payer: Medicare Other | Source: Ambulatory Visit | Attending: Geriatric Medicine | Admitting: Geriatric Medicine

## 2017-08-18 DIAGNOSIS — R413 Other amnesia: Secondary | ICD-10-CM | POA: Diagnosis not present

## 2017-08-18 MED ORDER — GADOBENATE DIMEGLUMINE 529 MG/ML IV SOLN
12.0000 mL | Freq: Once | INTRAVENOUS | Status: AC | PRN
  Administered 2017-08-18: 12 mL via INTRAVENOUS

## 2017-08-31 DIAGNOSIS — G301 Alzheimer's disease with late onset: Secondary | ICD-10-CM | POA: Diagnosis not present

## 2017-08-31 DIAGNOSIS — F0280 Dementia in other diseases classified elsewhere without behavioral disturbance: Secondary | ICD-10-CM | POA: Diagnosis not present

## 2017-09-24 ENCOUNTER — Encounter (HOSPITAL_COMMUNITY): Payer: Self-pay | Admitting: Nurse Practitioner

## 2017-09-24 ENCOUNTER — Emergency Department (HOSPITAL_COMMUNITY): Payer: Medicare Other

## 2017-09-24 ENCOUNTER — Emergency Department (HOSPITAL_COMMUNITY)
Admission: EM | Admit: 2017-09-24 | Discharge: 2017-09-24 | Disposition: A | Discharge status: Home / Self Care | Payer: Medicare Other | Attending: Emergency Medicine | Admitting: Emergency Medicine

## 2017-09-24 DIAGNOSIS — M545 Low back pain: Secondary | ICD-10-CM | POA: Diagnosis not present

## 2017-09-24 DIAGNOSIS — M549 Dorsalgia, unspecified: Secondary | ICD-10-CM

## 2017-09-24 DIAGNOSIS — Z7982 Long term (current) use of aspirin: Secondary | ICD-10-CM | POA: Diagnosis not present

## 2017-09-24 DIAGNOSIS — S3992XA Unspecified injury of lower back, initial encounter: Secondary | ICD-10-CM | POA: Diagnosis present

## 2017-09-24 DIAGNOSIS — Y929 Unspecified place or not applicable: Secondary | ICD-10-CM | POA: Diagnosis not present

## 2017-09-24 DIAGNOSIS — Z87891 Personal history of nicotine dependence: Secondary | ICD-10-CM | POA: Diagnosis not present

## 2017-09-24 DIAGNOSIS — S32028A Other fracture of second lumbar vertebra, initial encounter for closed fracture: Secondary | ICD-10-CM | POA: Diagnosis not present

## 2017-09-24 DIAGNOSIS — Y939 Activity, unspecified: Secondary | ICD-10-CM | POA: Diagnosis not present

## 2017-09-24 DIAGNOSIS — Y999 Unspecified external cause status: Secondary | ICD-10-CM | POA: Insufficient documentation

## 2017-09-24 DIAGNOSIS — Z79899 Other long term (current) drug therapy: Secondary | ICD-10-CM | POA: Diagnosis not present

## 2017-09-24 DIAGNOSIS — S32020A Wedge compression fracture of second lumbar vertebra, initial encounter for closed fracture: Secondary | ICD-10-CM | POA: Insufficient documentation

## 2017-09-24 DIAGNOSIS — G8911 Acute pain due to trauma: Secondary | ICD-10-CM | POA: Diagnosis not present

## 2017-09-24 DIAGNOSIS — E039 Hypothyroidism, unspecified: Secondary | ICD-10-CM | POA: Diagnosis not present

## 2017-09-24 DIAGNOSIS — S3991XA Unspecified injury of abdomen, initial encounter: Secondary | ICD-10-CM | POA: Diagnosis not present

## 2017-09-24 DIAGNOSIS — W19XXXA Unspecified fall, initial encounter: Secondary | ICD-10-CM | POA: Insufficient documentation

## 2017-09-24 LAB — COMPREHENSIVE METABOLIC PANEL
ALT: 21 U/L (ref 14–54)
ANION GAP: 11 (ref 5–15)
AST: 29 U/L (ref 15–41)
Albumin: 3.9 g/dL (ref 3.5–5.0)
Alkaline Phosphatase: 60 U/L (ref 38–126)
BUN: 14 mg/dL (ref 6–20)
CHLORIDE: 96 mmol/L — AB (ref 101–111)
CO2: 26 mmol/L (ref 22–32)
CREATININE: 0.61 mg/dL (ref 0.44–1.00)
Calcium: 9.3 mg/dL (ref 8.9–10.3)
GFR calc non Af Amer: >60 mL/min (ref >60)
Glucose, Bld: 103 mg/dL — ABNORMAL HIGH (ref 65–99)
Potassium: 3.9 mmol/L (ref 3.5–5.1)
SODIUM: 133 mmol/L — AB (ref 135–145)
Total Bilirubin: 0.8 mg/dL (ref 0.3–1.2)
Total Protein: 7.2 g/dL (ref 6.5–8.1)

## 2017-09-24 LAB — CBC WITH DIFFERENTIAL/PLATELET
Basophils Absolute: 0.1 10*3/uL (ref 0.0–0.1)
Basophils Relative: 1 %
EOS ABS: 0 10*3/uL (ref 0.0–0.7)
EOS PCT: 0 %
HCT: 42.9 % (ref 36.0–46.0)
Hemoglobin: 14.6 g/dL (ref 12.0–15.0)
LYMPHS ABS: 2.8 10*3/uL (ref 0.7–4.0)
LYMPHS PCT: 21 %
MCH: 31.1 pg (ref 26.0–34.0)
MCHC: 34 g/dL (ref 30.0–36.0)
MCV: 91.5 fL (ref 78.0–100.0)
MONO ABS: 1.1 10*3/uL — AB (ref 0.1–1.0)
MONOS PCT: 9 %
Neutro Abs: 9.3 10*3/uL — ABNORMAL HIGH (ref 1.7–7.7)
Neutrophils Relative %: 69 %
PLATELETS: 338 10*3/uL (ref 150–400)
RBC: 4.69 MIL/uL (ref 3.87–5.11)
RDW: 14.4 % (ref 11.5–15.5)
WBC: 13.2 10*3/uL — AB (ref 4.0–10.5)

## 2017-09-24 LAB — CK: Total CK: 176 U/L (ref 38–234)

## 2017-09-24 LAB — LIPASE, BLOOD: LIPASE: 30 U/L (ref 11–51)

## 2017-09-24 MED ORDER — ACETAMINOPHEN 500 MG PO TABS
1000.0000 mg | ORAL_TABLET | Freq: Once | ORAL | Status: AC
Start: 2017-09-24 — End: 2017-09-24
  Administered 2017-09-24: 1000 mg via ORAL
  Filled 2017-09-24: qty 2

## 2017-09-24 MED ORDER — MORPHINE SULFATE 15 MG PO TABS: 15.0000 mg | ORAL_TABLET | ORAL | 0 refills | Status: DC | PRN

## 2017-09-24 MED ORDER — OXYCODONE HCL 5 MG PO TABS
5.0000 mg | ORAL_TABLET | Freq: Once | ORAL | Status: AC
  Administered 2017-09-24: 5 mg via ORAL
  Filled 2017-09-24: qty 1

## 2017-09-24 NOTE — Discharge Instructions (Signed)
Take tylenol 1000mg(2 extra strength) four times a day.  ° °Then take the pain medicine if you feel like you need it. Narcotics do not help with the pain, they only make you care about it less.  You can become addicted to this, people may break into your house to steal it.  It will constipate you.  If you drive under the influence of this medicine you can get a DUI.   ° °

## 2017-09-24 NOTE — ED Triage Notes (Signed)
Pt is c/o severe back pain. States she fell about 3 days ago and since then "her tail bone has been hurting severely." Denies loss of bowel or bladder control.

## 2017-09-24 NOTE — ED Provider Notes (Signed)
[facility] DEPT Provider Note   CSN: [id number] Arrival date & time: 09/24/17  1533     History   Chief Complaint Chief Complaint  Patient presents with  . Back Pain    HPI Laurie Klein is a 84 y.o. female.  84 yo F with a chief complaint of low back pain.  Patient has a history of low back pain in the past.  She thinks that this pain feels different.  She fell about 3 or 4 days ago she is not sure exactly why she fell.  Landed on her tailbone.  Complaining of pain to her low back that goes down both legs.  She has had pain down both legs previously and she states this usually happens when she has worsening of her chronic back pain.  She denies loss of bowel or bladder denies loss of perirectal sensation.  The patient has been lying in bed since this event happened and she feels that the pain is gotten worse.  She denies dysuria denies abdominal pain denies vomiting or diarrhea.  She denies fevers or chills.  She denies recent back surgery.  The history is provided by the patient.  Back Pain   This is a recurrent problem. The current episode started more than 2 days ago. The problem occurs constantly. The problem has not changed since onset.The pain is associated with falling. The pain is present in the lumbar spine. The quality of the pain is described as stabbing, shooting and aching. The pain radiates to the right foot and left thigh. The pain is at a severity of 8/10. The pain is severe. The symptoms are aggravated by bending, twisting and certain positions. Pertinent negatives include no chest pain, no fever, no headaches and no dysuria. She has tried nothing for the symptoms. The treatment provided no relief.    Past Medical History:  Diagnosis Date  . Anxiety   . Chronic headaches    history of  . DDD (degenerative disc disease) 05/24/2013  . Degenerative disc disease   . Dyslipidemia   . Headache(784.0): chronic 05/24/2013  . Heart  palpitations   . Hypothyroidism   . Mitral valve prolapse   . MVP (mitral valve prolapse) 05/24/2013  . PAC (premature atrial contraction)   . Spinal stenosis   . TMJ syndrome     Patient Active Problem List   Diagnosis Date Noted  . Contusion of right periocular region   . Great toe pain, left   . Syncope 04/19/2017  . Syncope and collapse 05/24/2013  . MVP (mitral valve prolapse) 05/24/2013  . Anxiety 05/24/2013  . Dyslipidemia 05/24/2013  . Hypothyroidism 05/24/2013  . Headache(784.0): chronic 05/24/2013  . Spinal stenosis 05/24/2013  . PAC (premature atrial contraction) 05/24/2013  . DDD (degenerative disc disease) 05/24/2013    Past Surgical History:  Procedure Laterality Date  . APPENDECTOMY    . BREAST BIOPSY  3 times  . BREAST EXCISIONAL BIOPSY Right 1970   benign  . BREAST EXCISIONAL BIOPSY Left 1950   2 benign  . BREAST EXCISIONAL BIOPSY Right 1950   benign  . CHOLECYSTECTOMY    . disectomy     micro lumbar  . TONSILLECTOMY       OB History   None      Home Medications    Prior to Admission medications   Medication Sig Start Date End Date Taking? Authorizing Provider  aspirin 81 MG tablet Take 81 mg by mouth daily.  [provider]  Calcium Citrate (CITRACAL PO) Take 1 tablet by mouth daily.     [provider]  cholecalciferol (VITAMIN D) 1000 UNITS tablet Take 1,000 Units by mouth daily.     [provider]  co-enzyme Q-10 30 MG capsule Take 100 mg by mouth daily.     [provider]  fish oil-omega-3 fatty acids 1000 MG capsule Take 2 capsules by mouth daily.     [provider]  morphine (MSIR) 15 MG tablet Take 1 tablet (15 mg total) by mouth every 4 (four) hours as needed for severe pain. 09/24/17   Deno Etienne, DO  Multiple Vitamin (MULTIVITAMIN) capsule Take 1 capsule by mouth daily.    [provider]  simvastatin (ZOCOR) 20 MG tablet Take 20 mg by mouth every evening.    [provider]  Specialty Vitamins Products (MAGNESIUM, AMINO ACID CHELATE,) 133 MG tablet Take 1 tablet by mouth daily.     [provider]  SYNTHROID 75 MCG tablet Take 75 mcg by mouth daily. 02/04/17   [provider]  tetrahydrozoline (VISINE) 0.05 % ophthalmic solution Place 2 drops into both eyes daily as needed (For dry eyes).    [provider]    Family History Family History  Problem Relation Age of Onset  . Heart failure Mother   . Stroke Mother   . Heart disease Father   . Melanoma Child     Social History Social History   Tobacco Use  . Smoking status: Former Smoker    Packs/day: 0.50    Types: Cigarettes    Last attempt to quit: 10/03/1973    Years since quitting: 44.0  . Smokeless tobacco: Never Used  Substance Use Topics  . Alcohol use: Yes    Alcohol/week: 1.5 oz    Types: 3 drink(s) per week    Comment: 4 glasses wine per week  . Drug use: No     Allergies   Acrylic polymer; Lipitor [atorvastatin calcium]; Orudis kt; Sulfa antibiotics; and Amoxicillin   Review of Systems Review of Systems  Constitutional: Negative for chills and fever.  HENT: Negative for congestion and rhinorrhea.   Eyes: Negative for redness and visual disturbance.  Respiratory: Negative for shortness of breath and wheezing.   Cardiovascular: Negative for chest pain and palpitations.  Gastrointestinal: Negative for nausea and vomiting.  Genitourinary: Negative for dysuria and urgency.  Musculoskeletal: Positive for arthralgias and back pain. Negative for myalgias.  Skin: Negative for pallor and wound.  Neurological: Negative for dizziness and headaches.     Physical Exam Updated Vital Signs BP (!) 121/50 (BP Location: Left Arm)   Pulse (!) 59   Temp 97.9 F (36.6 C) (Oral)   Resp 16   SpO2 99%   Physical Exam  Constitutional: She is oriented to person, place, and time. She appears well-developed and well-nourished. No distress.  HENT:  Head:  Normocephalic and atraumatic.  Eyes: Pupils are equal, round, and reactive to light. EOM are normal.  Neck: Normal range of motion. Neck supple.  Cardiovascular: Normal rate and regular rhythm. Exam reveals no gallop and no friction rub.  No murmur heard. Pulmonary/Chest: Effort normal. She has no wheezes. She has no rales.  Abdominal: Soft. She exhibits no distension and no mass. There is no tenderness. There is no guarding.  Musculoskeletal: She exhibits no edema or tenderness.  No noted tenderness on exam.  Patient complaining of vague pain about the entire lower back.  No pain to the SI joints no pain to the midline spine.  No pain to the piriformis.  Pulse motor and sensation is intact to bilateral lower extremities.  Neurological: She is alert and oriented to person, place, and time.  Skin: Skin is warm and dry. She is not diaphoretic.  Psychiatric: She has a normal mood and affect. Her behavior is normal.  Nursing note and vitals reviewed.    ED Treatments / Results  Labs (all labs ordered are listed, but only abnormal results are displayed) Labs Reviewed  CBC WITH DIFFERENTIAL/PLATELET - Abnormal; Notable for the following components:      Result Value   WBC 13.2 (*)    Neutro Abs 9.3 (*)    Monocytes Absolute 1.1 (*)    All other components within normal limits  COMPREHENSIVE METABOLIC PANEL - Abnormal; Notable for the following components:   Sodium 133 (*)    Chloride 96 (*)    Glucose, Bld 103 (*)    All other components within normal limits  CK  LIPASE, BLOOD    EKG None  Radiology Ct L-spine No Charge  Result Date: 09/24/2017 CLINICAL DATA:  84 y/o F; cell 3 days ago with severe tailbone pain. EXAM: CT LUMBAR SPINE WITHOUT CONTRAST TECHNIQUE: Multidetector CT imaging of the lumbar spine was performed without intravenous contrast administration. Multiplanar CT image reconstructions were also generated. COMPARISON:  Concurrent CT of abdomen and pelvis. FINDINGS:  Segmentation: 5 lumbar type vertebrae. Alignment: Mild levocurvature of lumbar spine.  Normal lordosis. Vertebrae: Acute L2 superior endplate fracture with up to 60% central loss of height involving the anterior and middle columns with 4 mm retropulsion of the superior endplate and a fracture lines propagating to the inferior endplate. No pedicle fracture identified. No additional fracture of vertebral bodies or the upper sacrum identified. Paraspinal and other soft tissues: Please refer to concurrent CT of the abdomen and pelvis. Disc levels: Moderate lumbar spine spondylosis with predominantly discogenic degenerative changes greatest at the L3 through S1 levels where there is loss of disc space height. Disc bulges and endplate marginal osteophytes combined with facet hypertrophy results in mild-to-moderate foraminal stenosis bilaterally at L3-S1. No high-grade canal stenosis. IMPRESSION: 1. Acute L2 superior endplate fracture with up to 60% central loss of vertebral body height involving anterior and middle columns with 4 mm retropulsion of superior endplate. Fracture extends to inferior endplate. No pedicle fracture or malalignment. 2. No other fracture or dislocation of lumbar spine identified. 3. Moderate lumbar spondylosis predominantly at L3-S1 levels. Electronically Signed   By: Kristine Garbe M.D.   On: 09/24/2017 18:47   Ct Renal Stone Study  Result Date: 09/24/2017 CLINICAL DATA:  84 y/o  F; fall with severe back pain. EXAM: CT ABDOMEN AND PELVIS WITHOUT CONTRAST TECHNIQUE: Multidetector CT imaging of the abdomen and pelvis was performed following the standard protocol without IV contrast. COMPARISON:  Concurrent lumbar spine CT. FINDINGS: Lower chest: No acute abnormality. Hepatobiliary: No focal liver abnormality is seen. Status post cholecystectomy. No biliary dilatation. Pancreas: Unremarkable. No pancreatic ductal dilatation or surrounding inflammatory changes. Spleen: Normal in size  without focal abnormality. Adrenals/Urinary Tract: Adrenal glands are unremarkable. Kidneys are normal, without renal calculi, focal lesion, or hydronephrosis. Bladder is unremarkable. Stomach/Bowel: Stomach is within normal limits. Moderate sigmoid diverticulosis without findings of acute diverticulitis. No evidence of bowel wall thickening, distention, or inflammatory changes. Vascular/Lymphatic: 13 mm periphery calcified lesion in the left splenic hilum, probably splenic artery aneurysm. Mild calcific atherosclerosis of  the aorta. No lymphadenopathy identified. Reproductive: Uterus and bilateral adnexa are unremarkable. Other: No abdominal wall hernia or abnormality. No abdominopelvic ascites. Musculoskeletal: Acute L2 superior endplate fracture as described on concurrent lumbar spine CT. No sacral, pelvis, or coccyx fracture identified. Contusion of gluteal fat on the right. IMPRESSION: 1. Acute L2 superior endplate fracture as described on concurrent lumbar spine CT. No sacral, coccyx, or pelvis fracture identified. 2. Contusion of right gluteal subcutaneous fat.  No large hematoma. 3. Peripherally calcified 13 mm lesion in splenic hilum, probably splenic artery aneurysm. Electronically Signed   By: Kristine Garbe M.D.   On: 09/24/2017 18:53    Procedures Procedures (including critical care time)  Medications Ordered in ED Medications  acetaminophen (TYLENOL) tablet 1,000 mg (1,000 mg Oral Given 09/24/17 1723)  oxyCODONE (Oxy IR/ROXICODONE) immediate release tablet 5 mg (5 mg Oral Given 09/24/17 1723)     Initial Impression / Assessment and Plan / ED Course  I have reviewed the triage vital signs and the nursing notes.  Pertinent labs & imaging results that were available during my care of the patient were reviewed by me and considered in my medical decision making (see chart for details).     84 yo F with a chief complaint of low back pain.  This happened post fall.  Will obtain a  CT scan to further visualize.  Obtain labs. Treat pain.  The patient has no noted neurologic deficit.  No clonus no hyperreflexia reflexes are normal bilaterally.  I do not feel that MRI is warranted at this time as the patient has had chronic low back pain with radiation to bilateral legs previously.  CT with L2 fx.  Placed in brace.  Spine and pcp follow up.   10:36 PM:  I have discussed the diagnosis/risks/treatment options with the patient and family and believe the pt to be eligible for discharge home to follow-up with PCP, Spine. We also discussed returning to the ED immediately if new or worsening sx occur. We discussed the sx which are most concerning (e.g., sudden worsening pain, fever, inability to tolerate by mouth) that necessitate immediate return. Medications administered to the patient during their visit and any new prescriptions provided to the patient are listed below.  Medications given during this visit Medications  acetaminophen (TYLENOL) tablet 1,000 mg (1,000 mg Oral Given 09/24/17 1723)  oxyCODONE (Oxy IR/ROXICODONE) immediate release tablet 5 mg (5 mg Oral Given 09/24/17 1723)     The patient appears reasonably screen and/or stabilized for discharge and I doubt any other medical condition or other [facility] requiring further screening, evaluation, or treatment in the ED at this time prior to discharge.    Final Clinical Impressions(s) / ED Diagnoses   Final diagnoses:  Closed compression fracture of second lumbar vertebra, initial encounter [facility])    ED Discharge Orders        Ordered    morphine (MSIR) 15 MG tablet  Every 4 hours PRN     09/24/17 2149       Deno Etienne, DO 09/24/17 2236

## 2017-09-27 DIAGNOSIS — R262 Difficulty in walking, not elsewhere classified: Secondary | ICD-10-CM | POA: Diagnosis not present

## 2017-09-27 DIAGNOSIS — M6281 Muscle weakness (generalized): Secondary | ICD-10-CM | POA: Diagnosis not present

## 2017-09-27 DIAGNOSIS — Z9181 History of falling: Secondary | ICD-10-CM | POA: Diagnosis not present

## 2017-09-27 DIAGNOSIS — E785 Hyperlipidemia, unspecified: Secondary | ICD-10-CM | POA: Diagnosis not present

## 2017-09-27 DIAGNOSIS — S32028D Other fracture of second lumbar vertebra, subsequent encounter for fracture with routine healing: Secondary | ICD-10-CM | POA: Diagnosis not present

## 2017-09-27 DIAGNOSIS — M545 Low back pain: Secondary | ICD-10-CM | POA: Diagnosis not present

## 2017-09-27 DIAGNOSIS — R41841 Cognitive communication deficit: Secondary | ICD-10-CM | POA: Diagnosis not present

## 2017-09-27 DIAGNOSIS — F0390 Unspecified dementia without behavioral disturbance: Secondary | ICD-10-CM | POA: Diagnosis not present

## 2017-09-27 DIAGNOSIS — R52 Pain, unspecified: Secondary | ICD-10-CM | POA: Diagnosis not present

## 2017-09-27 DIAGNOSIS — E039 Hypothyroidism, unspecified: Secondary | ICD-10-CM | POA: Diagnosis not present

## 2017-09-28 DIAGNOSIS — R41841 Cognitive communication deficit: Secondary | ICD-10-CM | POA: Diagnosis not present

## 2017-09-28 DIAGNOSIS — Z9181 History of falling: Secondary | ICD-10-CM | POA: Diagnosis not present

## 2017-09-28 DIAGNOSIS — M6281 Muscle weakness (generalized): Secondary | ICD-10-CM | POA: Diagnosis not present

## 2017-09-28 DIAGNOSIS — S32028D Other fracture of second lumbar vertebra, subsequent encounter for fracture with routine healing: Secondary | ICD-10-CM | POA: Diagnosis not present

## 2017-09-28 DIAGNOSIS — R262 Difficulty in walking, not elsewhere classified: Secondary | ICD-10-CM | POA: Diagnosis not present

## 2017-09-29 DIAGNOSIS — M6281 Muscle weakness (generalized): Secondary | ICD-10-CM | POA: Diagnosis not present

## 2017-09-29 DIAGNOSIS — E785 Hyperlipidemia, unspecified: Secondary | ICD-10-CM | POA: Diagnosis not present

## 2017-09-29 DIAGNOSIS — R52 Pain, unspecified: Secondary | ICD-10-CM | POA: Diagnosis not present

## 2017-09-29 DIAGNOSIS — E039 Hypothyroidism, unspecified: Secondary | ICD-10-CM | POA: Diagnosis not present

## 2017-09-29 DIAGNOSIS — M545 Low back pain: Secondary | ICD-10-CM | POA: Diagnosis not present

## 2017-09-29 DIAGNOSIS — F0390 Unspecified dementia without behavioral disturbance: Secondary | ICD-10-CM | POA: Diagnosis not present

## 2017-09-29 DIAGNOSIS — R41841 Cognitive communication deficit: Secondary | ICD-10-CM | POA: Diagnosis not present

## 2017-09-29 DIAGNOSIS — R262 Difficulty in walking, not elsewhere classified: Secondary | ICD-10-CM | POA: Diagnosis not present

## 2017-09-29 DIAGNOSIS — Z9181 History of falling: Secondary | ICD-10-CM | POA: Diagnosis not present

## 2017-09-29 DIAGNOSIS — S32028D Other fracture of second lumbar vertebra, subsequent encounter for fracture with routine healing: Secondary | ICD-10-CM | POA: Diagnosis not present

## 2017-09-30 DIAGNOSIS — R41841 Cognitive communication deficit: Secondary | ICD-10-CM | POA: Diagnosis not present

## 2017-09-30 DIAGNOSIS — Z9181 History of falling: Secondary | ICD-10-CM | POA: Diagnosis not present

## 2017-09-30 DIAGNOSIS — S32028D Other fracture of second lumbar vertebra, subsequent encounter for fracture with routine healing: Secondary | ICD-10-CM | POA: Diagnosis not present

## 2017-09-30 DIAGNOSIS — M6281 Muscle weakness (generalized): Secondary | ICD-10-CM | POA: Diagnosis not present

## 2017-09-30 DIAGNOSIS — R262 Difficulty in walking, not elsewhere classified: Secondary | ICD-10-CM | POA: Diagnosis not present

## 2017-10-03 DIAGNOSIS — M545 Low back pain: Secondary | ICD-10-CM | POA: Diagnosis not present

## 2017-10-03 DIAGNOSIS — S32028D Other fracture of second lumbar vertebra, subsequent encounter for fracture with routine healing: Secondary | ICD-10-CM | POA: Diagnosis not present

## 2017-10-03 DIAGNOSIS — R41841 Cognitive communication deficit: Secondary | ICD-10-CM | POA: Diagnosis not present

## 2017-10-03 DIAGNOSIS — M6281 Muscle weakness (generalized): Secondary | ICD-10-CM | POA: Diagnosis not present

## 2017-10-03 DIAGNOSIS — R52 Pain, unspecified: Secondary | ICD-10-CM | POA: Diagnosis not present

## 2017-10-03 DIAGNOSIS — F0390 Unspecified dementia without behavioral disturbance: Secondary | ICD-10-CM | POA: Diagnosis not present

## 2017-10-03 DIAGNOSIS — R262 Difficulty in walking, not elsewhere classified: Secondary | ICD-10-CM | POA: Diagnosis not present

## 2017-10-03 DIAGNOSIS — E039 Hypothyroidism, unspecified: Secondary | ICD-10-CM | POA: Diagnosis not present

## 2017-10-03 DIAGNOSIS — E785 Hyperlipidemia, unspecified: Secondary | ICD-10-CM | POA: Diagnosis not present

## 2017-10-03 DIAGNOSIS — Z9181 History of falling: Secondary | ICD-10-CM | POA: Diagnosis not present

## 2017-10-04 DIAGNOSIS — E785 Hyperlipidemia, unspecified: Secondary | ICD-10-CM | POA: Diagnosis not present

## 2017-10-04 DIAGNOSIS — M6281 Muscle weakness (generalized): Secondary | ICD-10-CM | POA: Diagnosis not present

## 2017-10-04 DIAGNOSIS — S32028D Other fracture of second lumbar vertebra, subsequent encounter for fracture with routine healing: Secondary | ICD-10-CM | POA: Diagnosis not present

## 2017-10-04 DIAGNOSIS — F0390 Unspecified dementia without behavioral disturbance: Secondary | ICD-10-CM | POA: Diagnosis not present

## 2017-10-04 DIAGNOSIS — R41841 Cognitive communication deficit: Secondary | ICD-10-CM | POA: Diagnosis not present

## 2017-10-04 DIAGNOSIS — Z9181 History of falling: Secondary | ICD-10-CM | POA: Diagnosis not present

## 2017-10-04 DIAGNOSIS — R52 Pain, unspecified: Secondary | ICD-10-CM | POA: Diagnosis not present

## 2017-10-04 DIAGNOSIS — E039 Hypothyroidism, unspecified: Secondary | ICD-10-CM | POA: Diagnosis not present

## 2017-10-04 DIAGNOSIS — M545 Low back pain: Secondary | ICD-10-CM | POA: Diagnosis not present

## 2017-10-04 DIAGNOSIS — R262 Difficulty in walking, not elsewhere classified: Secondary | ICD-10-CM | POA: Diagnosis not present

## 2017-10-05 DIAGNOSIS — R262 Difficulty in walking, not elsewhere classified: Secondary | ICD-10-CM | POA: Diagnosis not present

## 2017-10-05 DIAGNOSIS — Z9181 History of falling: Secondary | ICD-10-CM | POA: Diagnosis not present

## 2017-10-05 DIAGNOSIS — R41841 Cognitive communication deficit: Secondary | ICD-10-CM | POA: Diagnosis not present

## 2017-10-05 DIAGNOSIS — S32028D Other fracture of second lumbar vertebra, subsequent encounter for fracture with routine healing: Secondary | ICD-10-CM | POA: Diagnosis not present

## 2017-10-05 DIAGNOSIS — M6281 Muscle weakness (generalized): Secondary | ICD-10-CM | POA: Diagnosis not present

## 2017-10-06 DIAGNOSIS — Z9181 History of falling: Secondary | ICD-10-CM | POA: Diagnosis not present

## 2017-10-06 DIAGNOSIS — M6281 Muscle weakness (generalized): Secondary | ICD-10-CM | POA: Diagnosis not present

## 2017-10-06 DIAGNOSIS — S32028D Other fracture of second lumbar vertebra, subsequent encounter for fracture with routine healing: Secondary | ICD-10-CM | POA: Diagnosis not present

## 2017-10-06 DIAGNOSIS — R41841 Cognitive communication deficit: Secondary | ICD-10-CM | POA: Diagnosis not present

## 2017-10-06 DIAGNOSIS — R262 Difficulty in walking, not elsewhere classified: Secondary | ICD-10-CM | POA: Diagnosis not present

## 2017-10-07 DIAGNOSIS — R41841 Cognitive communication deficit: Secondary | ICD-10-CM | POA: Diagnosis not present

## 2017-10-07 DIAGNOSIS — S32028D Other fracture of second lumbar vertebra, subsequent encounter for fracture with routine healing: Secondary | ICD-10-CM | POA: Diagnosis not present

## 2017-10-07 DIAGNOSIS — M6281 Muscle weakness (generalized): Secondary | ICD-10-CM | POA: Diagnosis not present

## 2017-10-07 DIAGNOSIS — R262 Difficulty in walking, not elsewhere classified: Secondary | ICD-10-CM | POA: Diagnosis not present

## 2017-10-07 DIAGNOSIS — Z9181 History of falling: Secondary | ICD-10-CM | POA: Diagnosis not present

## 2017-10-10 DIAGNOSIS — S32028D Other fracture of second lumbar vertebra, subsequent encounter for fracture with routine healing: Secondary | ICD-10-CM | POA: Diagnosis not present

## 2017-10-10 DIAGNOSIS — R262 Difficulty in walking, not elsewhere classified: Secondary | ICD-10-CM | POA: Diagnosis not present

## 2017-10-10 DIAGNOSIS — M6281 Muscle weakness (generalized): Secondary | ICD-10-CM | POA: Diagnosis not present

## 2017-10-10 DIAGNOSIS — Z9181 History of falling: Secondary | ICD-10-CM | POA: Diagnosis not present

## 2017-10-10 DIAGNOSIS — R41841 Cognitive communication deficit: Secondary | ICD-10-CM | POA: Diagnosis not present

## 2017-10-11 DIAGNOSIS — F0390 Unspecified dementia without behavioral disturbance: Secondary | ICD-10-CM | POA: Diagnosis not present

## 2017-10-11 DIAGNOSIS — M545 Low back pain: Secondary | ICD-10-CM | POA: Diagnosis not present

## 2017-10-11 DIAGNOSIS — E039 Hypothyroidism, unspecified: Secondary | ICD-10-CM | POA: Diagnosis not present

## 2017-10-11 DIAGNOSIS — E785 Hyperlipidemia, unspecified: Secondary | ICD-10-CM | POA: Diagnosis not present

## 2017-10-11 DIAGNOSIS — S32028D Other fracture of second lumbar vertebra, subsequent encounter for fracture with routine healing: Secondary | ICD-10-CM | POA: Diagnosis not present

## 2017-10-11 DIAGNOSIS — M6281 Muscle weakness (generalized): Secondary | ICD-10-CM | POA: Diagnosis not present

## 2017-10-11 DIAGNOSIS — R52 Pain, unspecified: Secondary | ICD-10-CM | POA: Diagnosis not present

## 2017-10-11 DIAGNOSIS — R41841 Cognitive communication deficit: Secondary | ICD-10-CM | POA: Diagnosis not present

## 2017-10-11 DIAGNOSIS — Z9181 History of falling: Secondary | ICD-10-CM | POA: Diagnosis not present

## 2017-10-11 DIAGNOSIS — R262 Difficulty in walking, not elsewhere classified: Secondary | ICD-10-CM | POA: Diagnosis not present

## 2017-10-12 DIAGNOSIS — R41841 Cognitive communication deficit: Secondary | ICD-10-CM | POA: Diagnosis not present

## 2017-10-12 DIAGNOSIS — S32028D Other fracture of second lumbar vertebra, subsequent encounter for fracture with routine healing: Secondary | ICD-10-CM | POA: Diagnosis not present

## 2017-10-12 DIAGNOSIS — M62830 Muscle spasm of back: Secondary | ICD-10-CM | POA: Diagnosis not present

## 2017-10-12 DIAGNOSIS — R262 Difficulty in walking, not elsewhere classified: Secondary | ICD-10-CM | POA: Diagnosis not present

## 2017-10-12 DIAGNOSIS — Z9181 History of falling: Secondary | ICD-10-CM | POA: Diagnosis not present

## 2017-10-12 DIAGNOSIS — M6281 Muscle weakness (generalized): Secondary | ICD-10-CM | POA: Diagnosis not present

## 2017-10-13 DIAGNOSIS — M6281 Muscle weakness (generalized): Secondary | ICD-10-CM | POA: Diagnosis not present

## 2017-10-13 DIAGNOSIS — R41841 Cognitive communication deficit: Secondary | ICD-10-CM | POA: Diagnosis not present

## 2017-10-13 DIAGNOSIS — Z9181 History of falling: Secondary | ICD-10-CM | POA: Diagnosis not present

## 2017-10-13 DIAGNOSIS — S32028D Other fracture of second lumbar vertebra, subsequent encounter for fracture with routine healing: Secondary | ICD-10-CM | POA: Diagnosis not present

## 2017-10-13 DIAGNOSIS — R262 Difficulty in walking, not elsewhere classified: Secondary | ICD-10-CM | POA: Diagnosis not present

## 2017-10-14 DIAGNOSIS — R262 Difficulty in walking, not elsewhere classified: Secondary | ICD-10-CM | POA: Diagnosis not present

## 2017-10-14 DIAGNOSIS — R41841 Cognitive communication deficit: Secondary | ICD-10-CM | POA: Diagnosis not present

## 2017-10-14 DIAGNOSIS — S32028D Other fracture of second lumbar vertebra, subsequent encounter for fracture with routine healing: Secondary | ICD-10-CM | POA: Diagnosis not present

## 2017-10-14 DIAGNOSIS — M6281 Muscle weakness (generalized): Secondary | ICD-10-CM | POA: Diagnosis not present

## 2017-10-14 DIAGNOSIS — Z9181 History of falling: Secondary | ICD-10-CM | POA: Diagnosis not present

## 2017-10-16 DIAGNOSIS — S32028D Other fracture of second lumbar vertebra, subsequent encounter for fracture with routine healing: Secondary | ICD-10-CM | POA: Diagnosis not present

## 2017-10-16 DIAGNOSIS — R41841 Cognitive communication deficit: Secondary | ICD-10-CM | POA: Diagnosis not present

## 2017-10-16 DIAGNOSIS — Z9181 History of falling: Secondary | ICD-10-CM | POA: Diagnosis not present

## 2017-10-16 DIAGNOSIS — R262 Difficulty in walking, not elsewhere classified: Secondary | ICD-10-CM | POA: Diagnosis not present

## 2017-10-16 DIAGNOSIS — M6281 Muscle weakness (generalized): Secondary | ICD-10-CM | POA: Diagnosis not present

## 2017-10-17 DIAGNOSIS — Z9181 History of falling: Secondary | ICD-10-CM | POA: Diagnosis not present

## 2017-10-17 DIAGNOSIS — S32028D Other fracture of second lumbar vertebra, subsequent encounter for fracture with routine healing: Secondary | ICD-10-CM | POA: Diagnosis not present

## 2017-10-17 DIAGNOSIS — R262 Difficulty in walking, not elsewhere classified: Secondary | ICD-10-CM | POA: Diagnosis not present

## 2017-10-17 DIAGNOSIS — M6281 Muscle weakness (generalized): Secondary | ICD-10-CM | POA: Diagnosis not present

## 2017-10-17 DIAGNOSIS — R41841 Cognitive communication deficit: Secondary | ICD-10-CM | POA: Diagnosis not present

## 2017-10-18 DIAGNOSIS — M6281 Muscle weakness (generalized): Secondary | ICD-10-CM | POA: Diagnosis not present

## 2017-10-18 DIAGNOSIS — R262 Difficulty in walking, not elsewhere classified: Secondary | ICD-10-CM | POA: Diagnosis not present

## 2017-10-18 DIAGNOSIS — R41841 Cognitive communication deficit: Secondary | ICD-10-CM | POA: Diagnosis not present

## 2017-10-18 DIAGNOSIS — Z9181 History of falling: Secondary | ICD-10-CM | POA: Diagnosis not present

## 2017-10-18 DIAGNOSIS — S32028D Other fracture of second lumbar vertebra, subsequent encounter for fracture with routine healing: Secondary | ICD-10-CM | POA: Diagnosis not present

## 2017-10-19 DIAGNOSIS — F4320 Adjustment disorder, unspecified: Secondary | ICD-10-CM | POA: Diagnosis not present

## 2017-10-19 DIAGNOSIS — Z9181 History of falling: Secondary | ICD-10-CM | POA: Diagnosis not present

## 2017-10-19 DIAGNOSIS — G3184 Mild cognitive impairment, so stated: Secondary | ICD-10-CM | POA: Diagnosis not present

## 2017-10-19 DIAGNOSIS — R262 Difficulty in walking, not elsewhere classified: Secondary | ICD-10-CM | POA: Diagnosis not present

## 2017-10-19 DIAGNOSIS — R41841 Cognitive communication deficit: Secondary | ICD-10-CM | POA: Diagnosis not present

## 2017-10-19 DIAGNOSIS — M6281 Muscle weakness (generalized): Secondary | ICD-10-CM | POA: Diagnosis not present

## 2017-10-19 DIAGNOSIS — S32028D Other fracture of second lumbar vertebra, subsequent encounter for fracture with routine healing: Secondary | ICD-10-CM | POA: Diagnosis not present

## 2017-10-20 DIAGNOSIS — R41841 Cognitive communication deficit: Secondary | ICD-10-CM | POA: Diagnosis not present

## 2017-10-20 DIAGNOSIS — S32028D Other fracture of second lumbar vertebra, subsequent encounter for fracture with routine healing: Secondary | ICD-10-CM | POA: Diagnosis not present

## 2017-10-20 DIAGNOSIS — Z9181 History of falling: Secondary | ICD-10-CM | POA: Diagnosis not present

## 2017-10-20 DIAGNOSIS — M6281 Muscle weakness (generalized): Secondary | ICD-10-CM | POA: Diagnosis not present

## 2017-10-20 DIAGNOSIS — R262 Difficulty in walking, not elsewhere classified: Secondary | ICD-10-CM | POA: Diagnosis not present

## 2017-10-21 DIAGNOSIS — S32028D Other fracture of second lumbar vertebra, subsequent encounter for fracture with routine healing: Secondary | ICD-10-CM | POA: Diagnosis not present

## 2017-10-21 DIAGNOSIS — R262 Difficulty in walking, not elsewhere classified: Secondary | ICD-10-CM | POA: Diagnosis not present

## 2017-10-21 DIAGNOSIS — Z9181 History of falling: Secondary | ICD-10-CM | POA: Diagnosis not present

## 2017-10-21 DIAGNOSIS — M6281 Muscle weakness (generalized): Secondary | ICD-10-CM | POA: Diagnosis not present

## 2017-10-21 DIAGNOSIS — R41841 Cognitive communication deficit: Secondary | ICD-10-CM | POA: Diagnosis not present

## 2017-10-24 DIAGNOSIS — S32028D Other fracture of second lumbar vertebra, subsequent encounter for fracture with routine healing: Secondary | ICD-10-CM | POA: Diagnosis not present

## 2017-10-24 DIAGNOSIS — W19XXXA Unspecified fall, initial encounter: Secondary | ICD-10-CM | POA: Diagnosis not present

## 2017-10-24 DIAGNOSIS — R262 Difficulty in walking, not elsewhere classified: Secondary | ICD-10-CM | POA: Diagnosis not present

## 2017-10-24 DIAGNOSIS — S0083XA Contusion of other part of head, initial encounter: Secondary | ICD-10-CM | POA: Diagnosis not present

## 2017-10-24 DIAGNOSIS — Z9181 History of falling: Secondary | ICD-10-CM | POA: Diagnosis not present

## 2017-10-24 DIAGNOSIS — M6281 Muscle weakness (generalized): Secondary | ICD-10-CM | POA: Diagnosis not present

## 2017-10-24 DIAGNOSIS — R41841 Cognitive communication deficit: Secondary | ICD-10-CM | POA: Diagnosis not present

## 2017-10-25 DIAGNOSIS — S32028D Other fracture of second lumbar vertebra, subsequent encounter for fracture with routine healing: Secondary | ICD-10-CM | POA: Diagnosis not present

## 2017-10-25 DIAGNOSIS — R41841 Cognitive communication deficit: Secondary | ICD-10-CM | POA: Diagnosis not present

## 2017-10-25 DIAGNOSIS — R262 Difficulty in walking, not elsewhere classified: Secondary | ICD-10-CM | POA: Diagnosis not present

## 2017-10-25 DIAGNOSIS — M6281 Muscle weakness (generalized): Secondary | ICD-10-CM | POA: Diagnosis not present

## 2017-10-25 DIAGNOSIS — Z9181 History of falling: Secondary | ICD-10-CM | POA: Diagnosis not present

## 2017-10-26 DIAGNOSIS — S32028D Other fracture of second lumbar vertebra, subsequent encounter for fracture with routine healing: Secondary | ICD-10-CM | POA: Diagnosis not present

## 2017-10-26 DIAGNOSIS — R41841 Cognitive communication deficit: Secondary | ICD-10-CM | POA: Diagnosis not present

## 2017-10-26 DIAGNOSIS — M6281 Muscle weakness (generalized): Secondary | ICD-10-CM | POA: Diagnosis not present

## 2017-10-26 DIAGNOSIS — Z9181 History of falling: Secondary | ICD-10-CM | POA: Diagnosis not present

## 2017-10-26 DIAGNOSIS — R262 Difficulty in walking, not elsewhere classified: Secondary | ICD-10-CM | POA: Diagnosis not present

## 2017-10-27 DIAGNOSIS — Z9181 History of falling: Secondary | ICD-10-CM | POA: Diagnosis not present

## 2017-10-27 DIAGNOSIS — F4320 Adjustment disorder, unspecified: Secondary | ICD-10-CM | POA: Diagnosis not present

## 2017-10-27 DIAGNOSIS — R41841 Cognitive communication deficit: Secondary | ICD-10-CM | POA: Diagnosis not present

## 2017-10-27 DIAGNOSIS — S32028D Other fracture of second lumbar vertebra, subsequent encounter for fracture with routine healing: Secondary | ICD-10-CM | POA: Diagnosis not present

## 2017-10-27 DIAGNOSIS — R262 Difficulty in walking, not elsewhere classified: Secondary | ICD-10-CM | POA: Diagnosis not present

## 2017-10-27 DIAGNOSIS — F0390 Unspecified dementia without behavioral disturbance: Secondary | ICD-10-CM | POA: Diagnosis not present

## 2017-10-27 DIAGNOSIS — M6281 Muscle weakness (generalized): Secondary | ICD-10-CM | POA: Diagnosis not present

## 2017-10-28 DIAGNOSIS — S32028D Other fracture of second lumbar vertebra, subsequent encounter for fracture with routine healing: Secondary | ICD-10-CM | POA: Diagnosis not present

## 2017-10-28 DIAGNOSIS — R55 Syncope and collapse: Secondary | ICD-10-CM | POA: Diagnosis not present

## 2017-10-28 DIAGNOSIS — M6281 Muscle weakness (generalized): Secondary | ICD-10-CM | POA: Diagnosis not present

## 2017-10-28 DIAGNOSIS — R41841 Cognitive communication deficit: Secondary | ICD-10-CM | POA: Diagnosis not present

## 2017-10-28 DIAGNOSIS — R52 Pain, unspecified: Secondary | ICD-10-CM | POA: Diagnosis not present

## 2017-10-28 DIAGNOSIS — E039 Hypothyroidism, unspecified: Secondary | ICD-10-CM | POA: Diagnosis not present

## 2017-10-28 DIAGNOSIS — F0390 Unspecified dementia without behavioral disturbance: Secondary | ICD-10-CM | POA: Diagnosis not present

## 2017-10-28 DIAGNOSIS — R262 Difficulty in walking, not elsewhere classified: Secondary | ICD-10-CM | POA: Diagnosis not present

## 2017-10-28 DIAGNOSIS — Z9181 History of falling: Secondary | ICD-10-CM | POA: Diagnosis not present

## 2017-10-28 DIAGNOSIS — M545 Low back pain: Secondary | ICD-10-CM | POA: Diagnosis not present

## 2017-10-28 DIAGNOSIS — E785 Hyperlipidemia, unspecified: Secondary | ICD-10-CM | POA: Diagnosis not present

## 2017-10-31 DIAGNOSIS — R262 Difficulty in walking, not elsewhere classified: Secondary | ICD-10-CM | POA: Diagnosis not present

## 2017-10-31 DIAGNOSIS — R41841 Cognitive communication deficit: Secondary | ICD-10-CM | POA: Diagnosis not present

## 2017-10-31 DIAGNOSIS — S32028D Other fracture of second lumbar vertebra, subsequent encounter for fracture with routine healing: Secondary | ICD-10-CM | POA: Diagnosis not present

## 2017-10-31 DIAGNOSIS — Z9181 History of falling: Secondary | ICD-10-CM | POA: Diagnosis not present

## 2017-10-31 DIAGNOSIS — M6281 Muscle weakness (generalized): Secondary | ICD-10-CM | POA: Diagnosis not present

## 2017-11-01 DIAGNOSIS — S32028D Other fracture of second lumbar vertebra, subsequent encounter for fracture with routine healing: Secondary | ICD-10-CM | POA: Diagnosis not present

## 2017-11-01 DIAGNOSIS — R41841 Cognitive communication deficit: Secondary | ICD-10-CM | POA: Diagnosis not present

## 2017-11-01 DIAGNOSIS — R262 Difficulty in walking, not elsewhere classified: Secondary | ICD-10-CM | POA: Diagnosis not present

## 2017-11-01 DIAGNOSIS — M6281 Muscle weakness (generalized): Secondary | ICD-10-CM | POA: Diagnosis not present

## 2017-11-01 DIAGNOSIS — Z9181 History of falling: Secondary | ICD-10-CM | POA: Diagnosis not present

## 2017-11-02 DIAGNOSIS — S32028D Other fracture of second lumbar vertebra, subsequent encounter for fracture with routine healing: Secondary | ICD-10-CM | POA: Diagnosis not present

## 2017-11-02 DIAGNOSIS — Z9181 History of falling: Secondary | ICD-10-CM | POA: Diagnosis not present

## 2017-11-02 DIAGNOSIS — M6281 Muscle weakness (generalized): Secondary | ICD-10-CM | POA: Diagnosis not present

## 2017-11-02 DIAGNOSIS — R262 Difficulty in walking, not elsewhere classified: Secondary | ICD-10-CM | POA: Diagnosis not present

## 2017-11-02 DIAGNOSIS — R41841 Cognitive communication deficit: Secondary | ICD-10-CM | POA: Diagnosis not present

## 2017-11-03 DIAGNOSIS — R41841 Cognitive communication deficit: Secondary | ICD-10-CM | POA: Diagnosis not present

## 2017-11-03 DIAGNOSIS — Z9181 History of falling: Secondary | ICD-10-CM | POA: Diagnosis not present

## 2017-11-03 DIAGNOSIS — S32028D Other fracture of second lumbar vertebra, subsequent encounter for fracture with routine healing: Secondary | ICD-10-CM | POA: Diagnosis not present

## 2017-11-03 DIAGNOSIS — R262 Difficulty in walking, not elsewhere classified: Secondary | ICD-10-CM | POA: Diagnosis not present

## 2017-11-03 DIAGNOSIS — M6281 Muscle weakness (generalized): Secondary | ICD-10-CM | POA: Diagnosis not present

## 2017-11-04 DIAGNOSIS — R262 Difficulty in walking, not elsewhere classified: Secondary | ICD-10-CM | POA: Diagnosis not present

## 2017-11-04 DIAGNOSIS — S32028D Other fracture of second lumbar vertebra, subsequent encounter for fracture with routine healing: Secondary | ICD-10-CM | POA: Diagnosis not present

## 2017-11-04 DIAGNOSIS — R41841 Cognitive communication deficit: Secondary | ICD-10-CM | POA: Diagnosis not present

## 2017-11-04 DIAGNOSIS — Z9181 History of falling: Secondary | ICD-10-CM | POA: Diagnosis not present

## 2017-11-04 DIAGNOSIS — M6281 Muscle weakness (generalized): Secondary | ICD-10-CM | POA: Diagnosis not present

## 2017-11-10 DIAGNOSIS — G8929 Other chronic pain: Secondary | ICD-10-CM | POA: Diagnosis not present

## 2017-11-10 DIAGNOSIS — M545 Low back pain: Secondary | ICD-10-CM | POA: Diagnosis not present

## 2017-11-10 DIAGNOSIS — Z9181 History of falling: Secondary | ICD-10-CM | POA: Diagnosis not present

## 2017-11-10 DIAGNOSIS — R2681 Unsteadiness on feet: Secondary | ICD-10-CM | POA: Diagnosis not present

## 2017-11-10 DIAGNOSIS — R278 Other lack of coordination: Secondary | ICD-10-CM | POA: Diagnosis not present

## 2017-11-10 DIAGNOSIS — G301 Alzheimer's disease with late onset: Secondary | ICD-10-CM | POA: Diagnosis not present

## 2017-11-10 DIAGNOSIS — F0280 Dementia in other diseases classified elsewhere without behavioral disturbance: Secondary | ICD-10-CM | POA: Diagnosis not present

## 2017-11-10 DIAGNOSIS — M48061 Spinal stenosis, lumbar region without neurogenic claudication: Secondary | ICD-10-CM | POA: Diagnosis not present

## 2017-11-14 DIAGNOSIS — R2681 Unsteadiness on feet: Secondary | ICD-10-CM | POA: Diagnosis not present

## 2017-11-14 DIAGNOSIS — R278 Other lack of coordination: Secondary | ICD-10-CM | POA: Diagnosis not present

## 2017-11-14 DIAGNOSIS — Z9181 History of falling: Secondary | ICD-10-CM | POA: Diagnosis not present

## 2017-11-14 DIAGNOSIS — M48061 Spinal stenosis, lumbar region without neurogenic claudication: Secondary | ICD-10-CM | POA: Diagnosis not present

## 2017-11-15 DIAGNOSIS — M48061 Spinal stenosis, lumbar region without neurogenic claudication: Secondary | ICD-10-CM | POA: Diagnosis not present

## 2017-11-15 DIAGNOSIS — R278 Other lack of coordination: Secondary | ICD-10-CM | POA: Diagnosis not present

## 2017-11-15 DIAGNOSIS — Z9181 History of falling: Secondary | ICD-10-CM | POA: Diagnosis not present

## 2017-11-15 DIAGNOSIS — R2681 Unsteadiness on feet: Secondary | ICD-10-CM | POA: Diagnosis not present

## 2017-11-17 DIAGNOSIS — R278 Other lack of coordination: Secondary | ICD-10-CM | POA: Diagnosis not present

## 2017-11-17 DIAGNOSIS — Z9181 History of falling: Secondary | ICD-10-CM | POA: Diagnosis not present

## 2017-11-17 DIAGNOSIS — M48061 Spinal stenosis, lumbar region without neurogenic claudication: Secondary | ICD-10-CM | POA: Diagnosis not present

## 2017-11-17 DIAGNOSIS — R2681 Unsteadiness on feet: Secondary | ICD-10-CM | POA: Diagnosis not present

## 2017-11-21 DIAGNOSIS — R2681 Unsteadiness on feet: Secondary | ICD-10-CM | POA: Diagnosis not present

## 2017-11-21 DIAGNOSIS — M48061 Spinal stenosis, lumbar region without neurogenic claudication: Secondary | ICD-10-CM | POA: Diagnosis not present

## 2017-11-21 DIAGNOSIS — Z9181 History of falling: Secondary | ICD-10-CM | POA: Diagnosis not present

## 2017-11-21 DIAGNOSIS — R278 Other lack of coordination: Secondary | ICD-10-CM | POA: Diagnosis not present

## 2017-11-22 DIAGNOSIS — M48061 Spinal stenosis, lumbar region without neurogenic claudication: Secondary | ICD-10-CM | POA: Diagnosis not present

## 2017-11-22 DIAGNOSIS — R2681 Unsteadiness on feet: Secondary | ICD-10-CM | POA: Diagnosis not present

## 2017-11-22 DIAGNOSIS — Z9181 History of falling: Secondary | ICD-10-CM | POA: Diagnosis not present

## 2017-11-22 DIAGNOSIS — R278 Other lack of coordination: Secondary | ICD-10-CM | POA: Diagnosis not present

## 2017-11-24 DIAGNOSIS — R278 Other lack of coordination: Secondary | ICD-10-CM | POA: Diagnosis not present

## 2017-11-24 DIAGNOSIS — R2681 Unsteadiness on feet: Secondary | ICD-10-CM | POA: Diagnosis not present

## 2017-11-24 DIAGNOSIS — M48061 Spinal stenosis, lumbar region without neurogenic claudication: Secondary | ICD-10-CM | POA: Diagnosis not present

## 2017-11-24 DIAGNOSIS — Z9181 History of falling: Secondary | ICD-10-CM | POA: Diagnosis not present

## 2017-11-29 DIAGNOSIS — R2681 Unsteadiness on feet: Secondary | ICD-10-CM | POA: Diagnosis not present

## 2017-11-29 DIAGNOSIS — R278 Other lack of coordination: Secondary | ICD-10-CM | POA: Diagnosis not present

## 2017-11-29 DIAGNOSIS — Z9181 History of falling: Secondary | ICD-10-CM | POA: Diagnosis not present

## 2017-11-29 DIAGNOSIS — M48061 Spinal stenosis, lumbar region without neurogenic claudication: Secondary | ICD-10-CM | POA: Diagnosis not present

## 2017-11-30 DIAGNOSIS — M48061 Spinal stenosis, lumbar region without neurogenic claudication: Secondary | ICD-10-CM | POA: Diagnosis not present

## 2017-11-30 DIAGNOSIS — R319 Hematuria, unspecified: Secondary | ICD-10-CM | POA: Diagnosis not present

## 2017-11-30 DIAGNOSIS — M6281 Muscle weakness (generalized): Secondary | ICD-10-CM | POA: Diagnosis not present

## 2017-11-30 DIAGNOSIS — N390 Urinary tract infection, site not specified: Secondary | ICD-10-CM | POA: Diagnosis not present

## 2017-11-30 DIAGNOSIS — R278 Other lack of coordination: Secondary | ICD-10-CM | POA: Diagnosis not present

## 2017-11-30 DIAGNOSIS — Z9181 History of falling: Secondary | ICD-10-CM | POA: Diagnosis not present

## 2017-11-30 DIAGNOSIS — M62830 Muscle spasm of back: Secondary | ICD-10-CM | POA: Diagnosis not present

## 2017-11-30 DIAGNOSIS — R2681 Unsteadiness on feet: Secondary | ICD-10-CM | POA: Diagnosis not present

## 2017-12-02 DIAGNOSIS — R2681 Unsteadiness on feet: Secondary | ICD-10-CM | POA: Diagnosis not present

## 2017-12-02 DIAGNOSIS — M48061 Spinal stenosis, lumbar region without neurogenic claudication: Secondary | ICD-10-CM | POA: Diagnosis not present

## 2017-12-02 DIAGNOSIS — R278 Other lack of coordination: Secondary | ICD-10-CM | POA: Diagnosis not present

## 2017-12-02 DIAGNOSIS — Z9181 History of falling: Secondary | ICD-10-CM | POA: Diagnosis not present

## 2017-12-03 DIAGNOSIS — N390 Urinary tract infection, site not specified: Secondary | ICD-10-CM | POA: Diagnosis not present

## 2017-12-03 DIAGNOSIS — R319 Hematuria, unspecified: Secondary | ICD-10-CM | POA: Diagnosis not present

## 2017-12-06 ENCOUNTER — Ambulatory Visit (INDEPENDENT_AMBULATORY_CARE_PROVIDER_SITE_OTHER): Payer: Medicare Other | Admitting: Physician Assistant

## 2017-12-06 ENCOUNTER — Encounter: Payer: Self-pay | Admitting: Physician Assistant

## 2017-12-06 VITALS — BP 128/78 | HR 70 | Ht 64.5 in | Wt 131.4 lb

## 2017-12-06 DIAGNOSIS — R2681 Unsteadiness on feet: Secondary | ICD-10-CM | POA: Diagnosis not present

## 2017-12-06 DIAGNOSIS — R55 Syncope and collapse: Secondary | ICD-10-CM

## 2017-12-06 DIAGNOSIS — E785 Hyperlipidemia, unspecified: Secondary | ICD-10-CM

## 2017-12-06 DIAGNOSIS — R278 Other lack of coordination: Secondary | ICD-10-CM | POA: Diagnosis not present

## 2017-12-06 DIAGNOSIS — E039 Hypothyroidism, unspecified: Secondary | ICD-10-CM

## 2017-12-06 DIAGNOSIS — Z9181 History of falling: Secondary | ICD-10-CM | POA: Diagnosis not present

## 2017-12-06 DIAGNOSIS — M48061 Spinal stenosis, lumbar region without neurogenic claudication: Secondary | ICD-10-CM | POA: Diagnosis not present

## 2017-12-06 DIAGNOSIS — M810 Age-related osteoporosis without current pathological fracture: Secondary | ICD-10-CM | POA: Diagnosis not present

## 2017-12-06 NOTE — Progress Notes (Signed)
Cardiology Office Note    Date:  12/06/2017   ID:  Laurie Klein, DOB 1931/06/29, MRN [id number]  PCP:  Lajean Manes, MD  Cardiologist:  Dr. Tamala Julian (last seen in the hospital 05/2013)  Chief Complaint  Patient presents with  . New Patient (Initial Visit)    had an episode of falling out 4 weeks ago, denies chest pains, lightheadness, fatigue, denies swelling in hands/feet, denies SOB    History of Present Illness:  Laurie Klein is a 84 y.o. female with PMH of hyperlipidemia, hypothyroidism, mitral valve prolapse, PAC, spinal stenosis and history of syncope.  Patient was previously seen in November 2014 for syncope by Dr. Tamala Julian.  The syncope caused traumatic injury to the face and the head.  Her symptom was felt to be neurally mediated at the time, possibly vasovagal.  Orthostatic vital signs were normal.  Echocardiogram obtained on 05/21/2013 showed EF 60 to 65%, no regional wall motion abnormality.  Carotid duplex at the time only showed mild disease bilaterally.  Outpatient 30-day event monitor was recommended, however this does not appears to have been done.  She was admitted to the hospital again in 2018 for syncope.  This was felt to be the fifth syncope in the past 5 years.  Cardiology service was not consulted however patient was eventually discharged with instruction to contact cardiology to set up a heart monitor as outpatient.  Outpatient heart monitor did not show significant arrhythmia.  CT of head showed stable atrophy with mild periventricular small vessel disease at the time.  TSH was normal.  Patient presents today for cardiology office visit.  She says her syncope usually occurs when she take a hot shower.  It does not proceed with any change in the body position.  She denies any prodromal symptoms such chest chest pain, dizziness, blurred vision or feeling of passing out.  She says she simply wake up on the floor afterward.  The last episode was a month and a half ago while she  was taking a shower as well.  Previous work-up demonstrated that orthostatic syncope is very unlikely.  Echocardiogram is also normal which did not suggest any structural heart issue.  It is possible that her syncope is neurogenic or related to vasodilation during the hot shower.  I have discussed the case with DOD Dr. Gwenlyn Found who recommended consideration of loop recorder.  She wished to discuss this with her son and to call us back to arrange the visit.  She is aware that a loop recorder can potentially figure out if there is any cardiogenic arrhythmia that can cause her symptoms and may prevent another one from occurring.  Otherwise given lack of chest discomfort, I would not recommend a stress test at this time.   Past Medical History:  Diagnosis Date  . Anxiety   . Chronic headaches    history of  . DDD (degenerative disc disease) 05/24/2013  . Degenerative disc disease   . Dyslipidemia   . Headache(784.0): chronic 05/24/2013  . Heart palpitations   . Hypothyroidism   . Mitral valve prolapse   . MVP (mitral valve prolapse) 05/24/2013  . PAC (premature atrial contraction)   . Spinal stenosis   . TMJ syndrome     Past Surgical History:  Procedure Laterality Date  . APPENDECTOMY    . BREAST BIOPSY  3 times  . BREAST EXCISIONAL BIOPSY Right 1970   benign  . BREAST EXCISIONAL BIOPSY Left 1950   2 benign  .  BREAST EXCISIONAL BIOPSY Right 1950   benign  . CHOLECYSTECTOMY    . disectomy     micro lumbar  . TONSILLECTOMY      Current Medications: Outpatient Medications Prior to Visit  Medication Sig Dispense Refill  . Calcium Citrate (CITRACAL PO) Take 1 tablet by mouth daily.     Marland Kitchen co-enzyme Q-10 30 MG capsule Take 100 mg by mouth daily.     Marland Kitchen donepezil (ARICEPT) 10 MG tablet Take 10 mg by mouth at bedtime.    . fish oil-omega-3 fatty acids 1000 MG capsule Take 2 capsules by mouth daily.     . Multiple Vitamin (MULTIVITAMIN) capsule Take 1 capsule by mouth daily.    .  simvastatin (ZOCOR) 20 MG tablet Take 20 mg by mouth every evening.    Marland Kitchen SYNTHROID 75 MCG tablet Take 75 mcg by mouth daily.    Marland Kitchen aspirin 81 MG tablet Take 81 mg by mouth daily.    . cholecalciferol (VITAMIN D) 1000 UNITS tablet Take 1,000 Units by mouth daily.     Marland Kitchen morphine (MSIR) 15 MG tablet Take 1 tablet (15 mg total) by mouth every 4 (four) hours as needed for severe pain. (Patient not taking: Reported on 12/06/2017) 10 tablet 0  . Specialty Vitamins Products (MAGNESIUM, AMINO ACID CHELATE,) 133 MG tablet Take 1 tablet by mouth daily.     Marland Kitchen tetrahydrozoline (VISINE) 0.05 % ophthalmic solution Place 2 drops into both eyes daily as needed (For dry eyes).     No facility-administered medications prior to visit.      Allergies:   Acrylic polymer; Lipitor [atorvastatin calcium]; Orudis kt; Sulfa antibiotics; and Amoxicillin   Social History   Socioeconomic History  . Marital status: Married    Spouse name: Not on file  . Number of children: Not on file  . Years of education: Not on file  . Highest education level: Not on file  Occupational History  . Occupation: retired Transport planner  . Financial resource strain: Not on file  . Food insecurity:    Worry: Not on file    Inability: Not on file  . Transportation needs:    Medical: Not on file    Non-medical: Not on file  Tobacco Use  . Smoking status: Former Smoker    Packs/day: 0.50    Types: Cigarettes    Last attempt to quit: 10/03/1973    Years since quitting: 44.2  . Smokeless tobacco: Never Used  Substance and Sexual Activity  . Alcohol use: Yes    Alcohol/week: 1.5 oz    Types: 3 drink(s) per week    Comment: 4 glasses wine per week  . Drug use: No  . Sexual activity: Not on file  Lifestyle  . Physical activity:    Days per week: Not on file    Minutes per session: Not on file  . Stress: Not on file  Relationships  . Social connections:    Talks on phone: Not on file    Gets together: Not on file     Attends religious service: Not on file    Active member of club or organization: Not on file    Attends meetings of clubs or organizations: Not on file    Relationship status: Not on file  Other Topics Concern  . Not on file  Social History Narrative  . Not on file     Family History:  The patient's family history includes Heart disease in her  father; Heart failure (age of onset: 24) in her mother; Melanoma in her child; Stroke in her mother.   ROS:   Please see the history of present illness.    ROS All other systems reviewed and are negative.   PHYSICAL EXAM:   VS:  BP 128/78 (BP Location: Left Arm)   Pulse 70   Ht 5' 4.5" (1.638 m)   Wt 131 lb 6.4 oz (59.6 kg)   BMI 22.21 kg/m    GEN: Well nourished, well developed, in no acute distress  HEENT: normal  Neck: no JVD, carotid bruits, or masses Cardiac: RRR; no murmurs, rubs, or gallops,no edema  Respiratory:  clear to auscultation bilaterally, normal work of breathing GI: soft, nontender, nondistended, + BS MS: no deformity or atrophy  Skin: warm and dry, no rash Neuro:  Alert and Oriented x 3, Strength and sensation are intact Psych: euthymic mood, full affect  Wt Readings from Last 3 Encounters:  12/06/17 131 lb 6.4 oz (59.6 kg)  04/19/17 129 lb 4.8 oz (58.7 kg)  05/26/13 122 lb 8 oz (55.6 kg)      Studies/Labs Reviewed:   EKG:  EKG is ordered today.  The ekg ordered today demonstrates normal sinus rhythm without significant ST-T wave changes  Recent Labs: 04/19/2017: TSH 1.221 09/24/2017: ALT 21; BUN 14; Creatinine, Ser 0.61; Hemoglobin 14.6; Platelets 338; Potassium 3.9; Sodium 133   Lipid Panel No results found for: CHOL, TRIG, HDL, CHOLHDL, VLDL, LDLCALC, LDLDIRECT  Additional studies/ records that were reviewed today include:   Echo 05/21/2013 LV EF: 60% -  65% Study Conclusions  Left ventricle: The cavity size was normal. Systolic function was normal. The estimated ejection fraction was in the  range of 60% to 65%. Wall motion was normal; there were no regional wall motion abnormalities.   Echo 04/20/2017 LV EF: 60% -  65% Study Conclusions  Left ventricle: The cavity size was normal. Systolic function was normal. The estimated ejection fraction was in the range of 60% to 65%. Wall motion was normal; there were no regional wall motion abnormalities.   Event monitor 05/06/2017 Narrative & Impression     Normal sinus rhythm.  No pathologic arrhythmias.      ASSESSMENT:    1. Syncope, unspecified syncope type   2. Hyperlipidemia, unspecified hyperlipidemia type   3. Hypothyroidism, unspecified type      PLAN:  In order of problems listed above:  1. Recurrent syncope: She has had at least 5 episodes of syncope in the past 5 years.  Echocardiogram in both 2014 and the 2018 did not show any structural heart issue.  Ejection fraction were normal.  TSH was normal.  She had a carotid Doppler in 2014 that did not show significant disease either.  Event monitor in November 2018 was negative for arrhythmia.  Yet she continued to have recurrent syncope episode, the last episode was a month and a half ago while she was taking a hot shower.  Differential diagnosis including vasovagal neurogenic syncope versus arrhythmogenic syncope.  It is possible that the hot shower caused significant vasodilatation that dropped her blood pressure as well.  I discussed the case with DOD Dr. Gwenlyn Found who recommended referral to Dr. Sallyanne Kuster or EP for consideration of loop recorder.  2. Hyperlipidemia: Managed by primary care provider.  On Zocor 20 mg daily.  She is currently taking aspirin 81 mg daily, however based on the new 2019 American Heart Association guideline, for people who have no prior history  of coronary artery disease and for over age 35, daily aspirin for primary prevention is no longer recommended.  I will defer this to his primary care provider.  3. Hypothyroidism: On Synthroid,  managed by primary care provider.    Medication Adjustments/Labs and Tests Ordered: Current medicines are reviewed at length with the patient today.  Concerns regarding medicines are outlined above.  Medication changes, Labs and Tests ordered today are listed in the Patient Instructions below. Patient Instructions  Medication Instructions:  Your physician recommends that you continue on your current medications as directed. Please refer to the Current Medication list given to you today.  Labwork: None   Testing/Procedures: None   Follow-Up: The provider recommends you follow up with a Electrophysiology physician. Dr Sallyanne Kuster in the [place] office or one of the physicians at our sister office [address]     Any Other Special Instructions Will Be Listed Below (If Applicable).  If you need a refill on your cardiac medications before your next appointment, please call your pharmacy.     Hilbert Corrigan, [place]  12/06/2017 3:07 PM    [facility] [address], [place], [place]  [address] Phone: [phone]; Fax: [phone]

## 2017-12-06 NOTE — Patient Instructions (Signed)
Medication Instructions:  Your physician recommends that you continue on your current medications as directed. Please refer to the Current Medication list given to you today.  Labwork: None   Testing/Procedures: None   Follow-Up: The provider recommends you follow up with a Electrophysiology physician. Dr Sallyanne Kuster in the [place] office or one of the physicians at our sister office [address].     Any Other Special Instructions Will Be Listed Below (If Applicable).  If you need a refill on your cardiac medications before your next appointment, please call your pharmacy.

## 2017-12-07 DIAGNOSIS — R278 Other lack of coordination: Secondary | ICD-10-CM | POA: Diagnosis not present

## 2017-12-07 DIAGNOSIS — R2681 Unsteadiness on feet: Secondary | ICD-10-CM | POA: Diagnosis not present

## 2017-12-07 DIAGNOSIS — M810 Age-related osteoporosis without current pathological fracture: Secondary | ICD-10-CM | POA: Diagnosis not present

## 2017-12-07 DIAGNOSIS — M48061 Spinal stenosis, lumbar region without neurogenic claudication: Secondary | ICD-10-CM | POA: Diagnosis not present

## 2017-12-07 DIAGNOSIS — Z9181 History of falling: Secondary | ICD-10-CM | POA: Diagnosis not present

## 2017-12-09 DIAGNOSIS — M48061 Spinal stenosis, lumbar region without neurogenic claudication: Secondary | ICD-10-CM | POA: Diagnosis not present

## 2017-12-09 DIAGNOSIS — Z9181 History of falling: Secondary | ICD-10-CM | POA: Diagnosis not present

## 2017-12-09 DIAGNOSIS — R278 Other lack of coordination: Secondary | ICD-10-CM | POA: Diagnosis not present

## 2017-12-09 DIAGNOSIS — M810 Age-related osteoporosis without current pathological fracture: Secondary | ICD-10-CM | POA: Diagnosis not present

## 2017-12-09 DIAGNOSIS — R2681 Unsteadiness on feet: Secondary | ICD-10-CM | POA: Diagnosis not present

## 2017-12-12 ENCOUNTER — Telehealth: Payer: Self-pay | Admitting: Physician Assistant

## 2017-12-12 NOTE — Telephone Encounter (Signed)
Spoke with pt, aware she will be getting a call about seeing the EP physicians to discuss a possible loop recorder. Message sent to [facility] tatum, ep scheduler to contact the patient.

## 2017-12-12 NOTE — Telephone Encounter (Signed)
New Message:    Pt said she saw Isaac Laud on last Tuesday.She said Isaac Laud said she need a procedure,pt did not know what the procedure was and when she was supposed to have it.

## 2017-12-20 ENCOUNTER — Other Ambulatory Visit: Payer: Self-pay | Admitting: Geriatric Medicine

## 2017-12-20 ENCOUNTER — Ambulatory Visit
Admission: RE | Admit: 2017-12-20 | Discharge: 2017-12-20 | Disposition: A | Discharge status: Home / Self Care | Payer: Medicare Other | Source: Ambulatory Visit | Attending: Geriatric Medicine | Admitting: Geriatric Medicine

## 2017-12-20 DIAGNOSIS — S0512XA Contusion of eyeball and orbital tissues, left eye, initial encounter: Secondary | ICD-10-CM

## 2017-12-20 DIAGNOSIS — R55 Syncope and collapse: Secondary | ICD-10-CM | POA: Diagnosis not present

## 2017-12-20 DIAGNOSIS — H1132 Conjunctival hemorrhage, left eye: Secondary | ICD-10-CM | POA: Diagnosis not present

## 2017-12-20 DIAGNOSIS — Z79899 Other long term (current) drug therapy: Secondary | ICD-10-CM | POA: Diagnosis not present

## 2017-12-20 DIAGNOSIS — S0083XA Contusion of other part of head, initial encounter: Secondary | ICD-10-CM | POA: Diagnosis not present

## 2018-01-02 ENCOUNTER — Encounter: Payer: Self-pay | Admitting: Internal Medicine

## 2018-01-02 ENCOUNTER — Ambulatory Visit (INDEPENDENT_AMBULATORY_CARE_PROVIDER_SITE_OTHER): Payer: Medicare Other | Admitting: Internal Medicine

## 2018-01-02 VITALS — BP 130/70 | HR 65 | Ht 65.0 in | Wt 127.0 lb

## 2018-01-02 DIAGNOSIS — R55 Syncope and collapse: Secondary | ICD-10-CM | POA: Diagnosis not present

## 2018-01-02 NOTE — Progress Notes (Signed)
HPI Laurie Klein is referred by Almyra Deforest for evaluation of unexplained syncope. She is healthy and has had 3 episodes over the past 6-7 month with the last 2 occurring in the past 7-8 weeks. The patient fell in the shower and has also fallen out of a chair. She denies any warning and no loss of bowel or bladder continence. She has no pain or tongue biting.  Allergies  Allergen Reactions  . Acrylic Polymer Other (See Comments)    Blisters inside mouth   . Lipitor [Atorvastatin Calcium] Other (See Comments)    Myalgias and muscle aches   . Orudis Kt     Unknown   . Sulfa Antibiotics Itching  . Amoxicillin Itching and Rash     Current Outpatient Medications  Medication Sig Dispense Refill  . Calcium Citrate (CITRACAL PO) Take 1 tablet by mouth daily.     . cholecalciferol (VITAMIN D) 1000 UNITS tablet Take 1,000 Units by mouth daily.     Marland Kitchen co-enzyme Q-10 30 MG capsule Take 100 mg by mouth daily.     Marland Kitchen donepezil (ARICEPT) 10 MG tablet Take 10 mg by mouth at bedtime.    . fish oil-omega-3 fatty acids 1000 MG capsule Take 2 capsules by mouth daily.     . Multiple Vitamin (MULTIVITAMIN) capsule Take 1 capsule by mouth daily.    . simvastatin (ZOCOR) 20 MG tablet Take 20 mg by mouth every evening.    Marland Kitchen SYNTHROID 75 MCG tablet Take 75 mcg by mouth daily.    Marland Kitchen tetrahydrozoline (VISINE) 0.05 % ophthalmic solution Place 2 drops into both eyes daily as needed (For dry eyes).     No current facility-administered medications for this visit.      Past Medical History:  Diagnosis Date  . Anxiety   . Chronic headaches    history of  . DDD (degenerative disc disease) 05/24/2013  . Degenerative disc disease   . Dyslipidemia   . Headache(784.0): chronic 05/24/2013  . Heart palpitations   . Hypothyroidism   . Mitral valve prolapse   . MVP (mitral valve prolapse) 05/24/2013  . PAC (premature atrial contraction)   . Spinal stenosis   . TMJ syndrome     ROS:   All systems reviewed  and negative except as noted in the HPI.   Past Surgical History:  Procedure Laterality Date  . APPENDECTOMY    . BREAST BIOPSY  3 times  . BREAST EXCISIONAL BIOPSY Right 1970   benign  . BREAST EXCISIONAL BIOPSY Left 1950   2 benign  . BREAST EXCISIONAL BIOPSY Right 1950   benign  . CHOLECYSTECTOMY    . disectomy     micro lumbar  . TONSILLECTOMY       Family History  Problem Relation Age of Onset  . Heart failure Mother 62  . Stroke Mother   . Heart disease Father   . Melanoma Child      Social History   Socioeconomic History  . Marital status: Married    Spouse name: Not on file  . Number of children: Not on file  . Years of education: Not on file  . Highest education level: Not on file  Occupational History  . Occupation: retired Transport planner  . Financial resource strain: Not on file  . Food insecurity:    Worry: Not on file    Inability: Not on file  . Transportation needs:    Medical: Not on  file    Non-medical: Not on file  Tobacco Use  . Smoking status: Former Smoker    Packs/day: 0.50    Types: Cigarettes    Last attempt to quit: 10/03/1973    Years since quitting: 44.2  . Smokeless tobacco: Never Used  Substance and Sexual Activity  . Alcohol use: Yes    Alcohol/week: 1.8 oz    Types: 3 drink(s) per week    Comment: 4 glasses wine per week  . Drug use: No  . Sexual activity: Not on file  Lifestyle  . Physical activity:    Days per week: Not on file    Minutes per session: Not on file  . Stress: Not on file  Relationships  . Social connections:    Talks on phone: Not on file    Gets together: Not on file    Attends religious service: Not on file    Active member of club or organization: Not on file    Attends meetings of clubs or organizations: Not on file    Relationship status: Not on file  . Intimate partner violence:    Fear of current or ex partner: Not on file    Emotionally abused: Not on file    Physically abused:  Not on file    Forced sexual activity: Not on file  Other Topics Concern  . Not on file  Social History Narrative  . Not on file     BP 130/70   Pulse 65   Ht 5\' 5"  (1.651 m)   Wt 127 lb (57.6 kg)   SpO2 99%   BMI 21.13 kg/m   Physical Exam:  Well appearing elderly woman with bilateral facial ecchymosis, NAD HEENT: Unremarkable Neck:  6 cm JVD, no thyromegally Lymphatics:  No adenopathy Back:  No CVA tenderness Lungs:  Clear with no wheezes HEART:  Regular rate rhythm, no murmurs, no rubs, no clicks Abd:  soft, positive bowel sounds, no organomegally, no rebound, no guarding Ext:  2 plus pulses, no edema, no cyanosis, no clubbing Skin:  No rashes no nodules Neuro:  CN II through XII intact, motor grossly intact  EKG - NSR   Assess/Plan: 1. Unexplained syncope - I have discussed the various ways of monitoring her and I think we can likely get a diagnosis with with a 30 day monitor. I will see her after.   Mikle Bosworth.D.

## 2018-01-02 NOTE — H&P (View-Only) (Signed)
HPI Mrs. Bleiler is referred by Almyra Deforest for evaluation of unexplained syncope. She is healthy and has had 3 episodes over the past 6-7 month with the last 2 occurring in the past 7-8 weeks. The patient fell in the shower and has also fallen out of a chair. She denies any warning and no loss of bowel or bladder continence. She has no pain or tongue biting.  Allergies  Allergen Reactions  . Acrylic Polymer Other (See Comments)    Blisters inside mouth   . Lipitor [Atorvastatin Calcium] Other (See Comments)    Myalgias and muscle aches   . Orudis Kt     Unknown   . Sulfa Antibiotics Itching  . Amoxicillin Itching and Rash     Current Outpatient Medications  Medication Sig Dispense Refill  . Calcium Citrate (CITRACAL PO) Take 1 tablet by mouth daily.     . cholecalciferol (VITAMIN D) 1000 UNITS tablet Take 1,000 Units by mouth daily.     Marland Kitchen co-enzyme Q-10 30 MG capsule Take 100 mg by mouth daily.     Marland Kitchen donepezil (ARICEPT) 10 MG tablet Take 10 mg by mouth at bedtime.    . fish oil-omega-3 fatty acids 1000 MG capsule Take 2 capsules by mouth daily.     . Multiple Vitamin (MULTIVITAMIN) capsule Take 1 capsule by mouth daily.    . simvastatin (ZOCOR) 20 MG tablet Take 20 mg by mouth every evening.    Marland Kitchen SYNTHROID 75 MCG tablet Take 75 mcg by mouth daily.    Marland Kitchen tetrahydrozoline (VISINE) 0.05 % ophthalmic solution Place 2 drops into both eyes daily as needed (For dry eyes).     No current facility-administered medications for this visit.      Past Medical History:  Diagnosis Date  . Anxiety   . Chronic headaches    history of  . DDD (degenerative disc disease) 05/24/2013  . Degenerative disc disease   . Dyslipidemia   . Headache(784.0): chronic 05/24/2013  . Heart palpitations   . Hypothyroidism   . Mitral valve prolapse   . MVP (mitral valve prolapse) 05/24/2013  . PAC (premature atrial contraction)   . Spinal stenosis   . TMJ syndrome     ROS:   All systems reviewed  and negative except as noted in the HPI.   Past Surgical History:  Procedure Laterality Date  . APPENDECTOMY    . BREAST BIOPSY  3 times  . BREAST EXCISIONAL BIOPSY Right 1970   benign  . BREAST EXCISIONAL BIOPSY Left 1950   2 benign  . BREAST EXCISIONAL BIOPSY Right 1950   benign  . CHOLECYSTECTOMY    . disectomy     micro lumbar  . TONSILLECTOMY       Family History  Problem Relation Age of Onset  . Heart failure Mother 33  . Stroke Mother   . Heart disease Father   . Melanoma Child      Social History   Socioeconomic History  . Marital status: Married    Spouse name: Not on file  . Number of children: Not on file  . Years of education: Not on file  . Highest education level: Not on file  Occupational History  . Occupation: retired Transport planner  . Financial resource strain: Not on file  . Food insecurity:    Worry: Not on file    Inability: Not on file  . Transportation needs:    Medical: Not on  file    Non-medical: Not on file  Tobacco Use  . Smoking status: Former Smoker    Packs/day: 0.50    Types: Cigarettes    Last attempt to quit: 10/03/1973    Years since quitting: 44.2  . Smokeless tobacco: Never Used  Substance and Sexual Activity  . Alcohol use: Yes    Alcohol/week: 1.8 oz    Types: 3 drink(s) per week    Comment: 4 glasses wine per week  . Drug use: No  . Sexual activity: Not on file  Lifestyle  . Physical activity:    Days per week: Not on file    Minutes per session: Not on file  . Stress: Not on file  Relationships  . Social connections:    Talks on phone: Not on file    Gets together: Not on file    Attends religious service: Not on file    Active member of club or organization: Not on file    Attends meetings of clubs or organizations: Not on file    Relationship status: Not on file  . Intimate partner violence:    Fear of current or ex partner: Not on file    Emotionally abused: Not on file    Physically abused:  Not on file    Forced sexual activity: Not on file  Other Topics Concern  . Not on file  Social History Narrative  . Not on file     BP 130/70   Pulse 65   Ht 5\' 5"  (1.651 m)   Wt 127 lb (57.6 kg)   SpO2 99%   BMI 21.13 kg/m   Physical Exam:  Well appearing elderly woman with bilateral facial ecchymosis, NAD HEENT: Unremarkable Neck:  6 cm JVD, no thyromegally Lymphatics:  No adenopathy Back:  No CVA tenderness Lungs:  Clear with no wheezes HEART:  Regular rate rhythm, no murmurs, no rubs, no clicks Abd:  soft, positive bowel sounds, no organomegally, no rebound, no guarding Ext:  2 plus pulses, no edema, no cyanosis, no clubbing Skin:  No rashes no nodules Neuro:  CN II through XII intact, motor grossly intact  EKG - NSR   Assess/Plan: 1. Unexplained syncope - I have discussed the various ways of monitoring her and I think we can likely get a diagnosis with with a 30 day monitor. I will see her after.   Mikle Bosworth.D.

## 2018-01-02 NOTE — Patient Instructions (Addendum)
Medication Instructions:  Your physician recommends that you continue on your current medications as directed. Please refer to the Current Medication list given to you today.  Labwork: None ordered.  Testing/Procedures: Your physician has recommended that you wear an event monitor. Event monitors are medical devices that record the heart's electrical activity. Doctors most often [place] these monitors to diagnose arrhythmias. Arrhythmias are problems with the speed or rhythm of the heartbeat. The monitor is a small, portable device. You can wear one while you do your normal daily activities. This is usually used to diagnose what is causing palpitations/syncope (passing out).  Please schedule for an event monitor.  Follow-Up: Your physician wants you to follow-up in: 3 months with Dr. Lovena Le.      Cardiac Event Monitoring A cardiac event monitor is a small recording device that is used to detect abnormal heart rhythms (arrhythmias). The monitor is used to record your heart rhythm when you have symptoms, such as:  Fast heartbeats (palpitations), such as heart racing or fluttering.  Dizziness.  Fainting or light-headedness.  Unexplained weakness.  Some monitors are wired to electrodes placed on your chest. Electrodes are flat, sticky disks that attach to your skin. Other monitors may be hand-held or worn on the wrist. The monitor can be worn for up to 30 days. If the monitor is attached to your chest, a technician will prepare your chest for the electrode placement and show you how to work the monitor. Take time to practice using the monitor before you leave the office. Make sure you understand how to send the information from the monitor to your health care provider. In some cases, you may need to use a landline telephone instead of a cell phone. What are the risks? Generally, this device is safe to use, but it possible that the skin under the electrodes will become irritated. How to use your  cardiac event monitor  Wear your monitor at all times, except when you are in water: ? Do not let the monitor get wet. ? Take the monitor off when you bathe. Do not swim or use a hot tub with it on.  Keep your skin clean. Do not put body lotion or moisturizer on your chest.  Change the electrodes as told by your health care provider or any time they stop sticking to your skin. You may need to use medical tape to keep them on.  Try to put the electrodes in slightly different places on your chest to help prevent skin irritation. They must remain in the area under your left breast and in the upper right section of your chest.  Make sure the monitor is safely clipped to your clothing or in a location close to your body that your health care provider recommends.  Press the button to record as soon as you feel heart-related symptoms, such as: ? Dizziness. ? Weakness. ? Light-headedness. ? Palpitations. ? Thumping or pounding in your chest. ? Shortness of breath. ? Unexplained weakness.  Keep a diary of your activities, such as walking, doing chores, and taking medicine. It is very important to note what you were doing when you pushed the button to record your symptoms. This will help your health care provider determine what might be contributing to your symptoms.  Send the recorded information as recommended by your health care provider. It may take some time for your health care provider to process the results.  Change the batteries as told by your health care provider.  Keep  electronic devices away from your monitor. This includes: ? Tablets. ? MP3 players. ? Cell phones.  While wearing your monitor you should avoid: ? Electric blankets. ? Armed forces operational officer. ? Electric toothbrushes. ? Microwave ovens. ? Magnets. ? Metal detectors. Get help right away if:  You have chest pain.  You have extreme difficulty breathing or shortness of breath.  You develop a very fast heartbeat  that persists.  You develop dizziness that does not go away.  You faint or constantly feel like you are about to faint. Summary  A cardiac event monitor is a small recording device that is used to help detect abnormal heart rhythms (arrhythmias).  The monitor is used to record your heart rhythm when you have heart-related symptoms.  Make sure you understand how to send the information from the monitor to your health care provider.  It is important to press the button on the monitor when you have any heart-related symptoms.  Keep a diary of your activities, such as walking, doing chores, and taking medicine. It is very important to note what you were doing when you pushed the button to record your symptoms. This will help your health care provider learn what might be causing your symptoms. This information is not intended to replace advice given to you by your health care provider. Make sure you discuss any questions you have with your health care provider. Document Released: 03/30/2008 Document Revised: 06/05/2016 Document Reviewed: 06/05/2016 Elsevier Interactive Patient Education  2017 [employer].

## 2018-01-03 DIAGNOSIS — R55 Syncope and collapse: Secondary | ICD-10-CM | POA: Diagnosis not present

## 2018-01-06 ENCOUNTER — Ambulatory Visit (INDEPENDENT_AMBULATORY_CARE_PROVIDER_SITE_OTHER): Payer: Medicare Other

## 2018-01-06 DIAGNOSIS — R55 Syncope and collapse: Secondary | ICD-10-CM

## 2018-01-09 ENCOUNTER — Telehealth: Payer: Self-pay | Admitting: Internal Medicine

## 2018-01-09 DIAGNOSIS — R55 Syncope and collapse: Secondary | ICD-10-CM | POA: Diagnosis not present

## 2018-01-09 NOTE — Telephone Encounter (Signed)
New Message:    Please call Izora Gala the nurse at [facility]. Pt have decided to have a Loop Recorder.

## 2018-01-09 NOTE — Telephone Encounter (Signed)
Natasha with [employer] called. Patient had an critical event at 7:36 CT. She reported that patient had syncope and felt  Dizziness before she pass out. Patient is now sitting feeling asymptomatic at a HR of 60 in sinus rhythm. Called patient stated she almost passed out after having a bowel movement. Patient stated she was sitting on the toilet when she felt dizzy. Patient stated she lowered herself to the floor and laid there until she felt like she was not going to pass out. Patient stated she then got up and cleaned herself. Informed patient that she might have bear down too hard while having a BM. Encouraged patient to try not to bear down when having a BM, and she can try a stool softener if she is constipated. Patient verbalized understanding. Will go show DOD, Dr. Angelena Form patient's monitor event for further advisement. Dr. Angelena Form agreed with advisement and we will continue to monitor. Will send to Dr. Lovena Le.

## 2018-01-09 NOTE — Telephone Encounter (Signed)
New message   Ronny Bacon from [employer] calling with critical EKG report.

## 2018-01-10 NOTE — Telephone Encounter (Signed)
Call back received from nurse with [facility].  Pt son asking if availability for procedure next Tuesday.  Scheduled Pt for July 16 at 1:30 pm per family request.

## 2018-01-10 NOTE — Telephone Encounter (Signed)
Returned call to nurse with [employer].  Per nurse, Pt was taking a shower with event monitor off when she had another syncopal episode. Per nurse Pt has dementia and the event monitor is a struggle for Pt and staff caring for Pt. Per nurse-Pt and family have decided that a loop recorder would be a better option. Spoke with Dr. Corliss Parish to change to loop recorder.  Call returned to [facility] nurse-advised ok with Dr. Lovena Le to schedule loop implant. [place] nurse to call Pt's son to schedule time and will notify this nurse. Await call back.

## 2018-01-17 ENCOUNTER — Ambulatory Visit (HOSPITAL_COMMUNITY)
Admission: RE | Admit: 2018-01-17 | Discharge: 2018-01-17 | Disposition: A | Discharge status: Home / Self Care | Payer: Medicare Other | Source: Ambulatory Visit | Attending: Internal Medicine | Admitting: Internal Medicine

## 2018-01-17 ENCOUNTER — Encounter (HOSPITAL_COMMUNITY)
Admission: RE | Disposition: A | Discharge status: Home / Self Care | Payer: Self-pay | Source: Ambulatory Visit | Attending: Internal Medicine

## 2018-01-17 DIAGNOSIS — Z87891 Personal history of nicotine dependence: Secondary | ICD-10-CM | POA: Insufficient documentation

## 2018-01-17 DIAGNOSIS — Z880 Allergy status to penicillin: Secondary | ICD-10-CM | POA: Diagnosis not present

## 2018-01-17 DIAGNOSIS — M26609 Unspecified temporomandibular joint disorder, unspecified side: Secondary | ICD-10-CM | POA: Insufficient documentation

## 2018-01-17 DIAGNOSIS — R55 Syncope and collapse: Secondary | ICD-10-CM | POA: Insufficient documentation

## 2018-01-17 DIAGNOSIS — R51 Headache: Secondary | ICD-10-CM | POA: Diagnosis not present

## 2018-01-17 DIAGNOSIS — Z882 Allergy status to sulfonamides status: Secondary | ICD-10-CM | POA: Insufficient documentation

## 2018-01-17 DIAGNOSIS — I341 Nonrheumatic mitral (valve) prolapse: Secondary | ICD-10-CM | POA: Diagnosis not present

## 2018-01-17 DIAGNOSIS — I491 Atrial premature depolarization: Secondary | ICD-10-CM | POA: Insufficient documentation

## 2018-01-17 DIAGNOSIS — E785 Hyperlipidemia, unspecified: Secondary | ICD-10-CM | POA: Insufficient documentation

## 2018-01-17 DIAGNOSIS — Z9181 History of falling: Secondary | ICD-10-CM | POA: Insufficient documentation

## 2018-01-17 DIAGNOSIS — Z8249 Family history of ischemic heart disease and other diseases of the circulatory system: Secondary | ICD-10-CM | POA: Diagnosis not present

## 2018-01-17 DIAGNOSIS — F419 Anxiety disorder, unspecified: Secondary | ICD-10-CM | POA: Diagnosis not present

## 2018-01-17 DIAGNOSIS — E039 Hypothyroidism, unspecified: Secondary | ICD-10-CM | POA: Diagnosis not present

## 2018-01-17 HISTORY — PX: LOOP RECORDER INSERTION: EP1214

## 2018-01-17 SURGERY — LOOP RECORDER INSERTION

## 2018-01-17 MED ORDER — LIDOCAINE-EPINEPHRINE 1 %-1:100000 IJ SOLN
INTRAMUSCULAR | Status: DC | PRN
  Administered 2018-01-17: 30 mL

## 2018-01-17 MED ORDER — LIDOCAINE-EPINEPHRINE 1 %-1:100000 IJ SOLN
INTRAMUSCULAR | Status: AC
  Filled 2018-01-17: qty 1

## 2018-01-17 SURGICAL SUPPLY — 2 items
LOOP REVEAL LINQSYS (Prosthesis & Implant Heart) ×2 IMPLANT
PACK LOOP INSERTION (CUSTOM PROCEDURE TRAY) ×2 IMPLANT

## 2018-01-17 NOTE — Interval H&P Note (Signed)
History and Physical Interval Note:  01/17/2018 2:11 PM  Laurie Klein  has presented today for surgery, with the diagnosis of syncope  The various methods of treatment have been discussed with the patient and family. After consideration of risks, benefits and other options for treatment, the patient has consented to  Procedure(s): LOOP RECORDER INSERTION (N/A) as a surgical intervention .  The patient's history has been reviewed, patient examined, no change in status, stable for surgery.  I have reviewed the patient's chart and labs.  Questions were answered to the patient's satisfaction.     Cristopher Peru

## 2018-01-18 ENCOUNTER — Telehealth: Payer: Self-pay | Admitting: Cardiology

## 2018-01-18 ENCOUNTER — Encounter (HOSPITAL_COMMUNITY): Payer: Self-pay | Admitting: Internal Medicine

## 2018-01-18 NOTE — Telephone Encounter (Signed)
Spoke w/ pt nurse at facility where patient stays. I instructed her how and when to use the symptom activator. Pt nurse verbalized understanding.

## 2018-01-30 ENCOUNTER — Ambulatory Visit (INDEPENDENT_AMBULATORY_CARE_PROVIDER_SITE_OTHER): Payer: Medicare Other | Admitting: *Deleted

## 2018-01-30 DIAGNOSIS — R55 Syncope and collapse: Secondary | ICD-10-CM

## 2018-01-30 LAB — CUP PACEART INCLINIC DEVICE CHECK
Implantable Pulse Generator Implant Date: 20190716
MDC IDC SESS DTM: 20190729102118

## 2018-01-30 NOTE — Progress Notes (Signed)
Wound check appointment. Steri-strips removed. Wound without redness or edema. Incision edges approximated, wound well healed. Battery status: Good. R-waves 0.92mV. 0 symptom episodes, 0 tachy episodes, 0 pause episodes, 0 brady episodes. 0 AF episodes (0% burden). Monthly summary reports and ROV with GT 04/26/18.

## 2018-02-04 DIAGNOSIS — R55 Syncope and collapse: Secondary | ICD-10-CM | POA: Diagnosis not present

## 2018-02-04 DIAGNOSIS — E039 Hypothyroidism, unspecified: Secondary | ICD-10-CM | POA: Diagnosis not present

## 2018-02-04 DIAGNOSIS — M810 Age-related osteoporosis without current pathological fracture: Secondary | ICD-10-CM | POA: Diagnosis not present

## 2018-02-04 DIAGNOSIS — R4189 Other symptoms and signs involving cognitive functions and awareness: Secondary | ICD-10-CM | POA: Diagnosis not present

## 2018-03-09 DIAGNOSIS — Z0000 Encounter for general adult medical examination without abnormal findings: Secondary | ICD-10-CM | POA: Diagnosis not present

## 2018-03-09 DIAGNOSIS — M810 Age-related osteoporosis without current pathological fracture: Secondary | ICD-10-CM | POA: Diagnosis not present

## 2018-03-09 DIAGNOSIS — Z79899 Other long term (current) drug therapy: Secondary | ICD-10-CM | POA: Diagnosis not present

## 2018-03-09 DIAGNOSIS — E7800 Pure hypercholesterolemia, unspecified: Secondary | ICD-10-CM | POA: Diagnosis not present

## 2018-03-09 DIAGNOSIS — E039 Hypothyroidism, unspecified: Secondary | ICD-10-CM | POA: Diagnosis not present

## 2018-03-09 DIAGNOSIS — Z1389 Encounter for screening for other disorder: Secondary | ICD-10-CM | POA: Diagnosis not present

## 2018-03-09 DIAGNOSIS — G301 Alzheimer's disease with late onset: Secondary | ICD-10-CM | POA: Diagnosis not present

## 2018-03-10 DIAGNOSIS — H04123 Dry eye syndrome of bilateral lacrimal glands: Secondary | ICD-10-CM | POA: Diagnosis not present

## 2018-03-10 DIAGNOSIS — Z961 Presence of intraocular lens: Secondary | ICD-10-CM | POA: Diagnosis not present

## 2018-03-20 ENCOUNTER — Encounter (HOSPITAL_COMMUNITY): Payer: Self-pay | Admitting: Neurology

## 2018-03-20 ENCOUNTER — Telehealth: Payer: Self-pay | Admitting: Cardiology

## 2018-03-20 ENCOUNTER — Emergency Department (HOSPITAL_COMMUNITY)
Admission: EM | Admit: 2018-03-20 | Discharge: 2018-03-20 | Disposition: A | Discharge status: Home / Self Care | Payer: Medicare Other | Attending: Emergency Medicine | Admitting: Emergency Medicine

## 2018-03-20 ENCOUNTER — Emergency Department (HOSPITAL_COMMUNITY): Payer: Medicare Other

## 2018-03-20 DIAGNOSIS — I959 Hypotension, unspecified: Secondary | ICD-10-CM | POA: Diagnosis not present

## 2018-03-20 DIAGNOSIS — R42 Dizziness and giddiness: Secondary | ICD-10-CM | POA: Diagnosis not present

## 2018-03-20 DIAGNOSIS — Y93E1 Activity, personal bathing and showering: Secondary | ICD-10-CM | POA: Insufficient documentation

## 2018-03-20 DIAGNOSIS — E039 Hypothyroidism, unspecified: Secondary | ICD-10-CM | POA: Insufficient documentation

## 2018-03-20 DIAGNOSIS — R55 Syncope and collapse: Secondary | ICD-10-CM | POA: Diagnosis not present

## 2018-03-20 DIAGNOSIS — W01198A Fall on same level from slipping, tripping and stumbling with subsequent striking against other object, initial encounter: Secondary | ICD-10-CM | POA: Diagnosis not present

## 2018-03-20 DIAGNOSIS — Z87891 Personal history of nicotine dependence: Secondary | ICD-10-CM | POA: Diagnosis not present

## 2018-03-20 DIAGNOSIS — S199XXA Unspecified injury of neck, initial encounter: Secondary | ICD-10-CM | POA: Diagnosis not present

## 2018-03-20 DIAGNOSIS — Y999 Unspecified external cause status: Secondary | ICD-10-CM | POA: Insufficient documentation

## 2018-03-20 DIAGNOSIS — M542 Cervicalgia: Secondary | ICD-10-CM | POA: Diagnosis not present

## 2018-03-20 DIAGNOSIS — R51 Headache: Secondary | ICD-10-CM | POA: Diagnosis not present

## 2018-03-20 DIAGNOSIS — Z79899 Other long term (current) drug therapy: Secondary | ICD-10-CM | POA: Diagnosis not present

## 2018-03-20 DIAGNOSIS — S0083XA Contusion of other part of head, initial encounter: Secondary | ICD-10-CM | POA: Insufficient documentation

## 2018-03-20 DIAGNOSIS — Y92031 Bathroom in apartment as the place of occurrence of the external cause: Secondary | ICD-10-CM | POA: Diagnosis not present

## 2018-03-20 DIAGNOSIS — R001 Bradycardia, unspecified: Secondary | ICD-10-CM | POA: Diagnosis not present

## 2018-03-20 DIAGNOSIS — R05 Cough: Secondary | ICD-10-CM | POA: Diagnosis not present

## 2018-03-20 DIAGNOSIS — S0990XA Unspecified injury of head, initial encounter: Secondary | ICD-10-CM | POA: Diagnosis not present

## 2018-03-20 LAB — CBC WITH DIFFERENTIAL/PLATELET
Abs Immature Granulocytes: 0 10*3/uL (ref 0.0–0.1)
Basophils Absolute: 0.1 10*3/uL (ref 0.0–0.1)
Basophils Relative: 1 %
EOS PCT: 1 %
Eosinophils Absolute: 0 10*3/uL (ref 0.0–0.7)
HEMATOCRIT: 40.3 % (ref 36.0–46.0)
HEMOGLOBIN: 13.4 g/dL (ref 12.0–15.0)
IMMATURE GRANULOCYTES: 1 %
LYMPHS ABS: 2.4 10*3/uL (ref 0.7–4.0)
LYMPHS PCT: 36 %
MCH: 30.8 pg (ref 26.0–34.0)
MCHC: 33.3 g/dL (ref 30.0–36.0)
MCV: 92.6 fL (ref 78.0–100.0)
Monocytes Absolute: 0.4 10*3/uL (ref 0.1–1.0)
Monocytes Relative: 7 %
Neutro Abs: 3.6 10*3/uL (ref 1.7–7.7)
Neutrophils Relative %: 54 %
Platelets: 280 10*3/uL (ref 150–400)
RBC: 4.35 MIL/uL (ref 3.87–5.11)
RDW: 14.6 % (ref 11.5–15.5)
WBC: 6.5 10*3/uL (ref 4.0–10.5)

## 2018-03-20 LAB — URINALYSIS, ROUTINE W REFLEX MICROSCOPIC
BACTERIA UA: NONE SEEN
BILIRUBIN URINE: NEGATIVE
Glucose, UA: NEGATIVE mg/dL
Hgb urine dipstick: NEGATIVE
KETONES UR: NEGATIVE mg/dL
NITRITE: NEGATIVE
Protein, ur: NEGATIVE mg/dL
Specific Gravity, Urine: 1.004 — ABNORMAL LOW (ref 1.005–1.030)
pH: 9 — ABNORMAL HIGH (ref 5.0–8.0)

## 2018-03-20 LAB — BASIC METABOLIC PANEL
Anion gap: 12 (ref 5–15)
BUN: 11 mg/dL (ref 8–23)
CALCIUM: 8.7 mg/dL — AB (ref 8.9–10.3)
CO2: 22 mmol/L (ref 22–32)
Chloride: 107 mmol/L (ref 98–111)
Creatinine, Ser: 0.73 mg/dL (ref 0.44–1.00)
GFR calc Af Amer: >60 mL/min (ref >60)
GFR calc non Af Amer: >60 mL/min (ref >60)
GLUCOSE: 98 mg/dL (ref 70–99)
Potassium: 3.9 mmol/L (ref 3.5–5.1)
Sodium: 141 mmol/L (ref 135–145)

## 2018-03-20 LAB — CBG MONITORING, ED: Glucose-Capillary: 71 mg/dL (ref 70–99)

## 2018-03-20 LAB — I-STAT TROPONIN, ED: TROPONIN I, POC: 0 ng/mL (ref 0.00–0.08)

## 2018-03-20 MED ORDER — SODIUM CHLORIDE 0.9 % IV BOLUS
1000.0000 mL | Freq: Once | INTRAVENOUS | Status: AC
  Administered 2018-03-20: 1000 mL via INTRAVENOUS

## 2018-03-20 NOTE — ED Notes (Addendum)
Pt's RN from independent living informed this RN that the pt has a loop recorder placed for a previous syncopal episode at the facility.  RN states pt complains of feeling hot then passes out.  Pt also has a hx of dementia.

## 2018-03-20 NOTE — Telephone Encounter (Signed)
Nurse with [facility] called and stated that patient passed out in the shower this morning. She stated that patient is currently at the hospital and she wanted to know if it was too late to use the symptom activator. I informed her that it was to late and that the hospital patient went to should know she has a loop recorder and they would be able to test the loop recorder to see if there was any related episodes. Pt nurse verbalized understanding and stated that she would call the ER and let them know she has a loop recorder.

## 2018-03-20 NOTE — ED Notes (Signed)
Patient transported to CT and XR 

## 2018-03-20 NOTE — ED Notes (Signed)
Patient transported to X-ray 

## 2018-03-20 NOTE — ED Notes (Signed)
This RN called pt's son at the request of the son Herbie [place] [phone])  Pt confirmed I could speak with son.  Son wanted to know about transportation back to the facility and to ask we call him if his mother is discharged.

## 2018-03-20 NOTE — ED Notes (Signed)
Patient ambulated in the hallway.  Patient was a one person assist when returning to her room. Patient states that she ambulates at home with a walker at times but at times she ambulates with no assistance.

## 2018-03-20 NOTE — Discharge Instructions (Addendum)
Your evaluated in the emergency department for a fainting spell at home.  You had a CAT scan of your head and neck along with blood work and an EKG and no obvious cause of your symptoms was identified.  Please stay well-hydrated and follow-up with your doctor.  Return if any worsening symptoms.

## 2018-03-20 NOTE — ED Provider Notes (Signed)
[facility] EMERGENCY DEPARTMENT Provider Note   CSN: [id number] Arrival date & time: 03/20/18  2229     History   Chief Complaint Chief Complaint  Patient presents with  . Loss of Consciousness    HPI Laurie Klein is a 84 y.o. female.  She is not on anticoagulation.  She was in the shower assisted by her healthcare attendant when she felt acutely lightheaded and then had a witnessed syncopal event.  She hit her head.  Sounds like her loss of consciousness was brief.  EMS found her blood pressure to be in the 70s initially gave her a fluid bolus and was improving on her.  Patient is complaining of some minor pain in the back of her head.  She thinks she has had a cough recently but has been nonproductive.  She denies any chest pain shortness of breath abdominal pain vomiting or diarrhea.  No urinary symptoms.  It sounds like there was another syncopal event about 3 weeks ago and she was not evaluated at that time.  The history is provided by the patient and the EMS personnel.  Loss of Consciousness   This is a recurrent problem. The current episode started less than 1 hour ago. The problem has been resolved. She lost consciousness for a period of less than one minute. The problem is associated with normal activity. Associated symptoms include light-headedness. Pertinent negatives include abdominal pain, chest pain, fever, nausea, slurred speech and vomiting. She has tried nothing for the symptoms. The treatment provided moderate relief.    Past Medical History:  Diagnosis Date  . Anxiety   . Chronic headaches    history of  . DDD (degenerative disc disease) 05/24/2013  . Degenerative disc disease   . Dyslipidemia   . Headache(784.0): chronic 05/24/2013  . Heart palpitations   . Hypothyroidism   . Mitral valve prolapse   . MVP (mitral valve prolapse) 05/24/2013  . PAC (premature atrial contraction)   . Spinal stenosis   . TMJ syndrome     Patient Active  Problem List   Diagnosis Date Noted  . Contusion of right periocular region   . Great toe pain, left   . Syncope 04/19/2017  . Syncope and collapse 05/24/2013  . MVP (mitral valve prolapse) 05/24/2013  . Anxiety 05/24/2013  . Dyslipidemia 05/24/2013  . Hypothyroidism 05/24/2013  . Headache(784.0): chronic 05/24/2013  . Spinal stenosis 05/24/2013  . PAC (premature atrial contraction) 05/24/2013  . DDD (degenerative disc disease) 05/24/2013    Past Surgical History:  Procedure Laterality Date  . APPENDECTOMY    . BREAST BIOPSY  3 times  . BREAST EXCISIONAL BIOPSY Right 1970   benign  . BREAST EXCISIONAL BIOPSY Left 1950   2 benign  . BREAST EXCISIONAL BIOPSY Right 1950   benign  . CHOLECYSTECTOMY    . disectomy     micro lumbar  . LOOP RECORDER INSERTION N/A 01/17/2018   Procedure: LOOP RECORDER INSERTION;  Surgeon: Evans Lance, MD;  Location: [facility];  Service: Cardiovascular;  Laterality: N/A;  . TONSILLECTOMY       OB History   None      Home Medications    Prior to Admission medications   Medication Sig Start Date End Date Taking? Authorizing Provider  Calcium Citrate (CITRACAL PO) Take 1 tablet by mouth daily.     [provider]  cholecalciferol (VITAMIN D) 1000 UNITS tablet Take 1,000 Units by mouth daily.  [provider]  co-enzyme Q-10 30 MG capsule Take 100 mg by mouth daily.     [provider]  donepezil (ARICEPT) 10 MG tablet Take 10 mg by mouth at bedtime.    [provider]  fish oil-omega-3 fatty acids 1000 MG capsule Take 2 capsules by mouth daily.     [provider]  Multiple Vitamin (MULTIVITAMIN) capsule Take 1 capsule by mouth daily.    [provider]  simvastatin (ZOCOR) 20 MG tablet Take 20 mg by mouth every evening.    [provider]  SYNTHROID 75 MCG tablet Take 75 mcg by mouth daily. 02/04/17   [provider]  tetrahydrozoline (VISINE) 0.05 %  ophthalmic solution Place 2 drops into both eyes daily as needed (For dry eyes).    [provider]    Family History Family History  Problem Relation Age of Onset  . Heart failure Mother 52  . Stroke Mother   . Heart disease Father   . Melanoma Child     Social History Social History   Tobacco Use  . Smoking status: Former Smoker    Packs/day: 0.50    Types: Cigarettes    Last attempt to quit: 10/03/1973    Years since quitting: 44.4  . Smokeless tobacco: Never Used  Substance Use Topics  . Alcohol use: Yes    Alcohol/week: 3.0 standard drinks    Types: 3 drink(s) per week    Comment: 4 glasses wine per week  . Drug use: No     Allergies   Acrylic polymer; Lipitor [atorvastatin calcium]; Orudis kt; Sulfa antibiotics; and Amoxicillin   Review of Systems Review of Systems  Constitutional: Negative for fever.  HENT: Negative for sore throat.   Eyes: Negative for visual disturbance.  Respiratory: Negative for shortness of breath.   Cardiovascular: Positive for syncope. Negative for chest pain.  Gastrointestinal: Negative for abdominal pain, nausea and vomiting.  Genitourinary: Negative for dysuria.  Musculoskeletal: Negative for neck pain.  Skin: Negative for rash.  Neurological: Positive for light-headedness.     Physical Exam Updated Vital Signs BP (!) 136/96 (BP Location: Right Arm)   Pulse (!) 57   Temp (!) 97.4 F (36.3 C) (Oral)   Resp 20   Ht 5\' 4"  (1.626 m)   Wt 58.1 kg   SpO2 100%   BMI 21.97 kg/m   Physical Exam  Constitutional: She appears well-developed and well-nourished. No distress.  HENT:  Head: Normocephalic.  Right Ear: External ear normal.  Left Ear: External ear normal.  Nose: Nose normal.  Mouth/Throat: Oropharynx is clear and moist.  She has a small hematoma on the back of her head.  Eyes: Conjunctivae are normal.  Neck:  In c-collar.  No step-offs no midline tenderness.  Cardiovascular: Normal rate and regular  rhythm.  No murmur heard. Pulmonary/Chest: Effort normal and breath sounds normal. No respiratory distress.  Abdominal: Soft. There is no tenderness.  Musculoskeletal: She exhibits no edema, tenderness or deformity.  Neurological: She is alert. She has normal strength. GCS eye subscore is 4. GCS verbal subscore is 5. GCS motor subscore is 6.  Skin: Skin is warm and dry. Capillary refill takes less than 2 seconds.  Psychiatric: She has a normal mood and affect.  Nursing note and vitals reviewed.    ED Treatments / Results  Labs (all labs ordered are listed, but only abnormal results are displayed) Labs Reviewed  URINALYSIS, ROUTINE W REFLEX MICROSCOPIC - Abnormal; Notable for  the following components:      Result Value   Color, Urine STRAW (*)    Specific Gravity, Urine 1.004 (*)    pH 9.0 (*)    Leukocytes, UA TRACE (*)    All other components within normal limits  BASIC METABOLIC PANEL - Abnormal; Notable for the following components:   Calcium 8.7 (*)    All other components within normal limits  CBC WITH DIFFERENTIAL/PLATELET  CBG MONITORING, ED  I-STAT TROPONIN, ED    EKG EKG Interpretation  Date/Time:  Monday March 20 2018 08:47:59 EDT Ventricular Rate:  57 PR Interval:    QRS Duration: 92 QT Interval:  468 QTC Calculation: 456 R Axis:   38 Text Interpretation:  Sinus rhythm Anteroseptal infarct, age indeterminate similar to prior 10/18 Confirmed by Aletta Edouard [phone]) on 03/20/2018 8:56:37 AM   Radiology Dg Chest 1 View  Result Date: 03/20/2018 CLINICAL DATA:  Syncope, cough. EXAM: CHEST  1 VIEW COMPARISON:  Radiographs of September 20, 2013. FINDINGS: The heart size and mediastinal contours are within normal limits. Both lungs are clear. No pneumothorax or pleural effusion is noted. The visualized skeletal structures are unremarkable. IMPRESSION: No acute cardiopulmonary abnormality seen. Electronically Signed   By: Marijo Conception, M.D.   On: 03/20/2018 09:56    Ct Head Wo Contrast  Result Date: 03/20/2018 CLINICAL DATA:  Pain following fall EXAM: CT HEAD WITHOUT CONTRAST CT CERVICAL SPINE WITHOUT CONTRAST TECHNIQUE: Multidetector CT imaging of the head and cervical spine was performed following the standard protocol without intravenous contrast. Multiplanar CT image reconstructions of the cervical spine were also generated. COMPARISON:  Head CT April 19, 2017 and brain MRI August 18, 2017 FINDINGS: CT HEAD FINDINGS Brain: There is moderate diffuse atrophy. There is no intracranial mass, hemorrhage, extra-axial fluid collection, or midline shift. There is small vessel disease in the centra semiovale bilaterally. Small vessel disease is also noted in portions of the left external capsule. No new gray-white compartment lesions are appreciable. No acute infarct is evident. Vascular: No hyperdense vessel. There is calcification in each distal vertebral artery and carotid siphon region. Skull: The bony calvarium appears intact. Sinuses/Orbits: There is mucosal thickening in several ethmoid air cells bilaterally. Other visualized paranasal sinuses are clear. Orbits appear symmetric bilaterally. Other: Mastoid air cells are clear. There is debris in the right external auditory canal. CT CERVICAL SPINE FINDINGS Alignment: There is 2 mm of retrolisthesis of C3 on C4. No other spondylolisthesis evident. Skull base and vertebrae: Skull base and craniocervical junction regions appear unremarkable. Mild pannus posterior to the odontoid is not causing appreciable impression on the craniocervical junction. No fracture is appreciable. There are no blastic or lytic bone lesions. Soft tissues and spinal canal: Prevertebral soft tissues and predental space regions are normal. There is no paraspinous lesion. There are no evident cord or canal hematoma. Disc levels: There is severe disc space narrowing at C3-4. There is moderately severe disc space narrowing at C5-6. There is milder  disc space narrowing at C4-5 and C6-7. There is facet hypertrophy at multiple levels, most notably at C3-4, C4-5, and C5-6 bilaterally. There is exit foraminal narrowing due to bony hypertrophy at C3-4 bilaterally and at C5-6 on the left. No frank disc extrusion or stenosis evident. Upper chest: Visualized upper lung zones are clear. Other: There is calcification focally in the left carotid artery. IMPRESSION: CT head: Atrophy with supratentorial small vessel disease. No acute infarct evident. No mass or hemorrhage. There are foci of  arterial vascular calcification. There is mucosal thickening in several ethmoid air cells. There is probable cerumen in the right external auditory canal. CT cervical spine: No fracture. Slight spondylolisthesis at C3-4 is felt to be due to underlying spondylosis. There is multilevel arthropathy. No frank disc extrusion or stenosis. Mild calcification in the left carotid artery noted. Electronically Signed   By: Lowella Grip III M.D.   On: 03/20/2018 09:58   Ct Cervical Spine Wo Contrast  Result Date: 03/20/2018 CLINICAL DATA:  Pain following fall EXAM: CT HEAD WITHOUT CONTRAST CT CERVICAL SPINE WITHOUT CONTRAST TECHNIQUE: Multidetector CT imaging of the head and cervical spine was performed following the standard protocol without intravenous contrast. Multiplanar CT image reconstructions of the cervical spine were also generated. COMPARISON:  Head CT April 19, 2017 and brain MRI August 18, 2017 FINDINGS: CT HEAD FINDINGS Brain: There is moderate diffuse atrophy. There is no intracranial mass, hemorrhage, extra-axial fluid collection, or midline shift. There is small vessel disease in the centra semiovale bilaterally. Small vessel disease is also noted in portions of the left external capsule. No new gray-white compartment lesions are appreciable. No acute infarct is evident. Vascular: No hyperdense vessel. There is calcification in each distal vertebral artery and carotid  siphon region. Skull: The bony calvarium appears intact. Sinuses/Orbits: There is mucosal thickening in several ethmoid air cells bilaterally. Other visualized paranasal sinuses are clear. Orbits appear symmetric bilaterally. Other: Mastoid air cells are clear. There is debris in the right external auditory canal. CT CERVICAL SPINE FINDINGS Alignment: There is 2 mm of retrolisthesis of C3 on C4. No other spondylolisthesis evident. Skull base and vertebrae: Skull base and craniocervical junction regions appear unremarkable. Mild pannus posterior to the odontoid is not causing appreciable impression on the craniocervical junction. No fracture is appreciable. There are no blastic or lytic bone lesions. Soft tissues and spinal canal: Prevertebral soft tissues and predental space regions are normal. There is no paraspinous lesion. There are no evident cord or canal hematoma. Disc levels: There is severe disc space narrowing at C3-4. There is moderately severe disc space narrowing at C5-6. There is milder disc space narrowing at C4-5 and C6-7. There is facet hypertrophy at multiple levels, most notably at C3-4, C4-5, and C5-6 bilaterally. There is exit foraminal narrowing due to bony hypertrophy at C3-4 bilaterally and at C5-6 on the left. No frank disc extrusion or stenosis evident. Upper chest: Visualized upper lung zones are clear. Other: There is calcification focally in the left carotid artery. IMPRESSION: CT head: Atrophy with supratentorial small vessel disease. No acute infarct evident. No mass or hemorrhage. There are foci of arterial vascular calcification. There is mucosal thickening in several ethmoid air cells. There is probable cerumen in the right external auditory canal. CT cervical spine: No fracture. Slight spondylolisthesis at C3-4 is felt to be due to underlying spondylosis. There is multilevel arthropathy. No frank disc extrusion or stenosis. Mild calcification in the left carotid artery noted.  Electronically Signed   By: Lowella Grip III M.D.   On: 03/20/2018 09:58    Procedures Procedures (including critical care time)  Medications Ordered in ED Medications  sodium chloride 0.9 % bolus 1,000 mL (has no administration in time range)     Initial Impression / Assessment and Plan / ED Course  I have reviewed the triage vital signs and the nursing notes.  Pertinent labs & imaging results that were available during my care of the patient were reviewed by me and considered in  my medical decision making (see chart for details).  Clinical Course as of Mar 21 804  Mon Mar 20, 1916  5423 84 year old female DNR/DNI had a witnessed syncopal event while in the shower today.  It sounds like it was preceded by some dizziness.  Per EMS she had had another event about 3 weeks ago syncope that she was not evaluated for.  She is getting some screening labs and a head CT and C-spine CT.  IV fluids.   [MB]  6815 Patient CT head and C-spine findings.  There is some chronic white matter changes a lot of degenerative disease in the spine.  C-collar removed patient happy for that.   [MB]  1502 Patient was up and ambulated here with assistance.   [MB]    Clinical Course User Index [MB] Hayden Rasmussen, MD     Final Clinical Impressions(s) / ED Diagnoses   Final diagnoses:  Syncope and collapse    ED Discharge Orders    None       Hayden Rasmussen, MD 03/21/18 [phone]

## 2018-03-20 NOTE — ED Notes (Signed)
Patient transported to CT 

## 2018-03-20 NOTE — ED Triage Notes (Signed)
Per ems- [facility] where she had witnessed syncopal episode in the shower with positive LOC in shower. No blood thinners. When in the shower she started feeling dizzy. She hit the back of her head. Initial BP 70 systolic, HR 48. Given 700 NS. CBG 122. Pt is a x 4. Is in c-collar.

## 2018-03-20 NOTE — ED Notes (Signed)
Spoke with pt's son and he states he or his wife will be able to transport pt back to her facility.

## 2018-03-21 ENCOUNTER — Telehealth: Payer: Self-pay | Admitting: *Deleted

## 2018-03-21 NOTE — Telephone Encounter (Signed)
Spoke with patient to advise that Dr. Caryl Comes discussed with Dr. Lovena Le and he recommended OV later this week to discuss plan. Patient is agreeable. She no longer drives. Offered to call her son to update him, but patient reports this is not necessary. She reports that she typically goes to appointments alone and that her son is very busy. Patient is aware that scheduler will contact her later today with an appointment. She is appreciative of call and denies questions or concerns at this time.

## 2018-03-21 NOTE — Telephone Encounter (Signed)
Spoke with patient and aide, Dionne, regarding pause episode recorded on LINQ on 03/20/18 at 0710, duration 3sec. ECG suggests sinus pause, followed by bradycardia, rate ~32bpm. Patient reports syncopal episode yesterday around 0725-0730, witnessed by Dionne. Patient was seen at [facility] ED, but patient and Dionne are unsure if LINQ was interrogated. Advised I will review episode with MD and call back with recommendations. They are aware and agreeable.  Dionne requests that I call [facility] at [phone] and ask for Harlon Ditty to setup transportation if scheduling an appointment.

## 2018-03-22 ENCOUNTER — Encounter: Payer: Medicare Other | Admitting: Internal Medicine

## 2018-03-22 ENCOUNTER — Ambulatory Visit: Payer: Medicare Other | Admitting: Internal Medicine

## 2018-03-22 ENCOUNTER — Telehealth: Payer: Self-pay | Admitting: Internal Medicine

## 2018-03-22 NOTE — Telephone Encounter (Signed)
Contacting [facility] to notify of Pt appt.

## 2018-03-22 NOTE — Telephone Encounter (Signed)
Patient is scheduled for OV with Dr. Lovena Le today at 11:15am.

## 2018-03-24 ENCOUNTER — Encounter: Payer: Self-pay | Admitting: Internal Medicine

## 2018-03-24 ENCOUNTER — Ambulatory Visit (INDEPENDENT_AMBULATORY_CARE_PROVIDER_SITE_OTHER): Payer: Medicare Other | Admitting: Internal Medicine

## 2018-03-24 ENCOUNTER — Ambulatory Visit (INDEPENDENT_AMBULATORY_CARE_PROVIDER_SITE_OTHER): Payer: Medicare Other | Admitting: *Deleted

## 2018-03-24 VITALS — BP 130/64 | HR 59 | Ht 64.0 in | Wt 124.0 lb

## 2018-03-24 DIAGNOSIS — R55 Syncope and collapse: Secondary | ICD-10-CM

## 2018-03-24 DIAGNOSIS — I495 Sick sinus syndrome: Secondary | ICD-10-CM

## 2018-03-24 LAB — CBC WITH DIFFERENTIAL/PLATELET
BASOS ABS: 0.1 10*3/uL (ref 0.0–0.2)
Basos: 1 %
EOS (ABSOLUTE): 0 10*3/uL (ref 0.0–0.4)
Eos: 0 %
HEMOGLOBIN: 13.3 g/dL (ref 11.1–15.9)
Hematocrit: 39.9 % (ref 34.0–46.6)
Immature Grans (Abs): 0 10*3/uL (ref 0.0–0.1)
Immature Granulocytes: 0 %
LYMPHS ABS: 3.1 10*3/uL (ref 0.7–3.1)
Lymphs: 33 %
MCH: 30.4 pg (ref 26.6–33.0)
MCHC: 33.3 g/dL (ref 31.5–35.7)
MCV: 91 fL (ref 79–97)
MONOCYTES: 6 %
MONOS ABS: 0.6 10*3/uL (ref 0.1–0.9)
NEUTROS ABS: 5.6 10*3/uL (ref 1.4–7.0)
Neutrophils: 60 %
Platelets: 333 10*3/uL (ref 150–450)
RBC: 4.37 x10E6/uL (ref 3.77–5.28)
RDW: 13.7 % (ref 12.3–15.4)
WBC: 9.4 10*3/uL (ref 3.4–10.8)

## 2018-03-24 LAB — BASIC METABOLIC PANEL
BUN / CREAT RATIO: 17 (ref 12–28)
BUN: 12 mg/dL (ref 8–27)
CO2: 24 mmol/L (ref 20–29)
CREATININE: 0.69 mg/dL (ref 0.57–1.00)
Calcium: 10.1 mg/dL (ref 8.7–10.3)
Chloride: 98 mmol/L (ref 96–106)
GFR calc Af Amer: 92 mL/min/{1.73_m2} (ref >59)
GFR, EST NON AFRICAN AMERICAN: 80 mL/min/{1.73_m2} (ref >59)
GLUCOSE: 86 mg/dL (ref 65–99)
Potassium: 4.6 mmol/L (ref 3.5–5.2)
SODIUM: 138 mmol/L (ref 134–144)

## 2018-03-24 NOTE — Patient Instructions (Addendum)
Medication Instructions:  Your physician recommends that you continue on your current medications as directed. Please refer to the Current Medication list given to you today.  Labwork: You will get lab work today:  BMP and CBC.  Testing/Procedures: Your physician has recommended that you have a pacemaker inserted. A pacemaker is a small device that is placed under the skin of your chest or abdomen to help control abnormal heart rhythms. This device uses electrical pulses to prompt the heart to beat at a normal rate. Pacemakers are used to treat heart rhythms that are too slow. Wire (leads) are attached to the pacemaker that goes into the chambers of you heart. This is done in the hospital and usually requires and overnight stay. Please see the instruction sheet given to you today for more information.  Follow-Up: You will follow up with device clinic 10-14 days after your procedure for a wound check.  You will follow up with Dr. Lovena Le  91 days after your procedure.  Any Other Special Instructions Will Be Listed Below (If Applicable).  Please arrive at the [facility] main entrance of [facility] hospital at:  xxx Use the CHG surgical scrub  Do not eat or drink after midnight prior to procedure You may take your normal morning medications the day of your procedure Plan for one night stay You will need someone to drive you home at discharge  If you need a refill on your cardiac medications before your next appointment, please call your pharmacy.

## 2018-03-24 NOTE — Progress Notes (Signed)
HPI Laurie Klein returns today for followup of her ILR and syncope. She is a pleasant slightly demented 84 yo woman with syncope s/p ILR who had a recurrent syncopal episode and was found on her ILR to have an episode of bradycardia with HR"s in the 30 range. She did not injure herself. Review of her ILR suggests that she may have had cardiac inhibition.  Allergies  Allergen Reactions  . Acrylic Polymer Other (See Comments)    Blisters inside mouth   . Lipitor [Atorvastatin Calcium] Other (See Comments)    Myalgias and muscle aches   . Orudis Kt Other (See Comments)    Unknown   . Sulfa Antibiotics Itching  . Amoxicillin Itching and Rash     Current Outpatient Medications  Medication Sig Dispense Refill  . Calcium Citrate (CITRACAL PO) Take 1 tablet by mouth daily.     . cholecalciferol (VITAMIN D) 1000 UNITS tablet Take 1,000 Units by mouth daily.     Marland Kitchen co-enzyme Q-10 30 MG capsule Take 100 mg by mouth daily.     Marland Kitchen donepezil (ARICEPT) 10 MG tablet Take 10 mg by mouth at bedtime.    . fish oil-omega-3 fatty acids 1000 MG capsule Take 2 g by mouth daily.     . Multiple Vitamin (MULTIVITAMIN) capsule Take 1 capsule by mouth daily.    . simvastatin (ZOCOR) 20 MG tablet Take 20 mg by mouth every evening.    Marland Kitchen SYNTHROID 75 MCG tablet Take 75 mcg by mouth daily.    Marland Kitchen tetrahydrozoline (VISINE) 0.05 % ophthalmic solution Place 3 drops into both eyes daily as needed (For dry eyes).      No current facility-administered medications for this visit.      Past Medical History:  Diagnosis Date  . Anxiety   . Chronic headaches    history of  . DDD (degenerative disc disease) 05/24/2013  . Degenerative disc disease   . Dyslipidemia   . Headache(784.0): chronic 05/24/2013  . Heart palpitations   . Hypothyroidism   . Mitral valve prolapse   . MVP (mitral valve prolapse) 05/24/2013  . PAC (premature atrial contraction)   . Spinal stenosis   . TMJ syndrome     ROS:   All systems  reviewed and negative except as noted in the HPI.   Past Surgical History:  Procedure Laterality Date  . APPENDECTOMY    . BREAST BIOPSY  3 times  . BREAST EXCISIONAL BIOPSY Right 1970   benign  . BREAST EXCISIONAL BIOPSY Left 1950   2 benign  . BREAST EXCISIONAL BIOPSY Right 1950   benign  . CHOLECYSTECTOMY    . disectomy     micro lumbar  . LOOP RECORDER INSERTION N/A 01/17/2018   Procedure: LOOP RECORDER INSERTION;  Surgeon: Evans Lance, MD;  Location: [facility];  Service: Cardiovascular;  Laterality: N/A;  . TONSILLECTOMY       Family History  Problem Relation Age of Onset  . Heart failure Mother 5  . Stroke Mother   . Heart disease Father   . Melanoma Child      Social History   Socioeconomic History  . Marital status: Married    Spouse name: Not on file  . Number of children: Not on file  . Years of education: Not on file  . Highest education level: Not on file  Occupational History  . Occupation: retired Transport planner  . Financial resource strain:  Not on file  . Food insecurity:    Worry: Not on file    Inability: Not on file  . Transportation needs:    Medical: Not on file    Non-medical: Not on file  Tobacco Use  . Smoking status: Former Smoker    Packs/day: 0.50    Types: Cigarettes    Last attempt to quit: 10/03/1973    Years since quitting: 44.5  . Smokeless tobacco: Never Used  Substance and Sexual Activity  . Alcohol use: Yes    Alcohol/week: 3.0 standard drinks    Types: 3 drink(s) per week    Comment: 4 glasses wine per week  . Drug use: No  . Sexual activity: Not on file  Lifestyle  . Physical activity:    Days per week: Not on file    Minutes per session: Not on file  . Stress: Not on file  Relationships  . Social connections:    Talks on phone: Not on file    Gets together: Not on file    Attends religious service: Not on file    Active member of club or organization: Not on file    Attends meetings of  clubs or organizations: Not on file    Relationship status: Not on file  . Intimate partner violence:    Fear of current or ex partner: Not on file    Emotionally abused: Not on file    Physically abused: Not on file    Forced sexual activity: Not on file  Other Topics Concern  . Not on file  Social History Narrative  . Not on file     BP 130/64   Pulse (!) 59   Ht 5\' 4"  (1.626 m)   Wt 124 lb (56.2 kg)   SpO2 98%   BMI 21.28 kg/m   Physical Exam:  Well appearing NAD HEENT: Unremarkable Neck:  No JVD, no thyromegally Lymphatics:  No adenopathy Back:  No CVA tenderness Lungs:  Clear with no wheezes HEART:  Regular rate rhythm, no murmurs, no rubs, no clicks Abd:  soft, positive bowel sounds, no organomegally, no rebound, no guarding Ext:  2 plus pulses, no edema, no cyanosis, no clubbing Skin:  No rashes no nodules Neuro:  CN II through XII intact, motor grossly intact  EKG - none  DEVICE  Normal device function.  See PaceArt for details. Marked sinus bradycardia  Assess/Plan: 1. Syncope - appears to be due to sinus node dysfunction vs cardiac inhibition. I have recommended she undergo PPM insertion and removal of her ILR. I have discussed the indications/risks/benefits/goals/expectations and she wishes to proceed. 2. Dementia - she is doing fairly well today. We will follow.  Mikle Bosworth.D.

## 2018-03-25 ENCOUNTER — Other Ambulatory Visit: Payer: Self-pay

## 2018-03-25 ENCOUNTER — Emergency Department (HOSPITAL_COMMUNITY): Payer: Medicare Other

## 2018-03-25 ENCOUNTER — Inpatient Hospital Stay (HOSPITAL_COMMUNITY)
Admission: EM | Admit: 2018-03-25 | Discharge: 2018-03-29 | DRG: 244 | Disposition: A | Discharge status: Skilled Nursing Facility | Payer: Medicare Other | Attending: Family Medicine | Admitting: Family Medicine

## 2018-03-25 ENCOUNTER — Encounter (HOSPITAL_COMMUNITY): Payer: Self-pay

## 2018-03-25 DIAGNOSIS — Z9109 Other allergy status, other than to drugs and biological substances: Secondary | ICD-10-CM | POA: Diagnosis not present

## 2018-03-25 DIAGNOSIS — Z87891 Personal history of nicotine dependence: Secondary | ICD-10-CM | POA: Diagnosis not present

## 2018-03-25 DIAGNOSIS — E039 Hypothyroidism, unspecified: Secondary | ICD-10-CM | POA: Diagnosis present

## 2018-03-25 DIAGNOSIS — Z823 Family history of stroke: Secondary | ICD-10-CM | POA: Diagnosis not present

## 2018-03-25 DIAGNOSIS — S79911A Unspecified injury of right hip, initial encounter: Secondary | ICD-10-CM | POA: Diagnosis not present

## 2018-03-25 DIAGNOSIS — Z881 Allergy status to other antibiotic agents status: Secondary | ICD-10-CM

## 2018-03-25 DIAGNOSIS — R55 Syncope and collapse: Secondary | ICD-10-CM | POA: Diagnosis not present

## 2018-03-25 DIAGNOSIS — E785 Hyperlipidemia, unspecified: Secondary | ICD-10-CM | POA: Diagnosis present

## 2018-03-25 DIAGNOSIS — M2550 Pain in unspecified joint: Secondary | ICD-10-CM | POA: Diagnosis not present

## 2018-03-25 DIAGNOSIS — S8992XA Unspecified injury of left lower leg, initial encounter: Secondary | ICD-10-CM | POA: Diagnosis not present

## 2018-03-25 DIAGNOSIS — I491 Atrial premature depolarization: Secondary | ICD-10-CM | POA: Diagnosis present

## 2018-03-25 DIAGNOSIS — Z66 Do not resuscitate: Secondary | ICD-10-CM | POA: Diagnosis present

## 2018-03-25 DIAGNOSIS — Z888 Allergy status to other drugs, medicaments and biological substances status: Secondary | ICD-10-CM | POA: Diagnosis not present

## 2018-03-25 DIAGNOSIS — W010XXA Fall on same level from slipping, tripping and stumbling without subsequent striking against object, initial encounter: Secondary | ICD-10-CM | POA: Diagnosis present

## 2018-03-25 DIAGNOSIS — Z8249 Family history of ischemic heart disease and other diseases of the circulatory system: Secondary | ICD-10-CM | POA: Diagnosis not present

## 2018-03-25 DIAGNOSIS — F0150 Vascular dementia without behavioral disturbance: Secondary | ICD-10-CM

## 2018-03-25 DIAGNOSIS — Z808 Family history of malignant neoplasm of other organs or systems: Secondary | ICD-10-CM | POA: Diagnosis not present

## 2018-03-25 DIAGNOSIS — R52 Pain, unspecified: Secondary | ICD-10-CM | POA: Diagnosis not present

## 2018-03-25 DIAGNOSIS — M25551 Pain in right hip: Secondary | ICD-10-CM | POA: Diagnosis not present

## 2018-03-25 DIAGNOSIS — F0390 Unspecified dementia without behavioral disturbance: Secondary | ICD-10-CM | POA: Diagnosis present

## 2018-03-25 DIAGNOSIS — M5136 Other intervertebral disc degeneration, lumbar region: Secondary | ICD-10-CM | POA: Diagnosis present

## 2018-03-25 DIAGNOSIS — S92025A Nondisplaced fracture of anterior process of left calcaneus, initial encounter for closed fracture: Secondary | ICD-10-CM | POA: Diagnosis not present

## 2018-03-25 DIAGNOSIS — Z882 Allergy status to sulfonamides status: Secondary | ICD-10-CM

## 2018-03-25 DIAGNOSIS — E876 Hypokalemia: Secondary | ICD-10-CM | POA: Diagnosis present

## 2018-03-25 DIAGNOSIS — R410 Disorientation, unspecified: Secondary | ICD-10-CM | POA: Diagnosis not present

## 2018-03-25 DIAGNOSIS — I959 Hypotension, unspecified: Secondary | ICD-10-CM | POA: Diagnosis not present

## 2018-03-25 DIAGNOSIS — I340 Nonrheumatic mitral (valve) insufficiency: Secondary | ICD-10-CM | POA: Diagnosis not present

## 2018-03-25 DIAGNOSIS — S82892A Other fracture of left lower leg, initial encounter for closed fracture: Secondary | ICD-10-CM | POA: Diagnosis not present

## 2018-03-25 DIAGNOSIS — Z79890 Hormone replacement therapy (postmenopausal): Secondary | ICD-10-CM

## 2018-03-25 DIAGNOSIS — D72829 Elevated white blood cell count, unspecified: Secondary | ICD-10-CM

## 2018-03-25 DIAGNOSIS — R001 Bradycardia, unspecified: Principal | ICD-10-CM | POA: Diagnosis present

## 2018-03-25 DIAGNOSIS — Z950 Presence of cardiac pacemaker: Secondary | ICD-10-CM

## 2018-03-25 DIAGNOSIS — Z7401 Bed confinement status: Secondary | ICD-10-CM | POA: Diagnosis not present

## 2018-03-25 DIAGNOSIS — Z9049 Acquired absence of other specified parts of digestive tract: Secondary | ICD-10-CM | POA: Diagnosis not present

## 2018-03-25 DIAGNOSIS — S92002A Unspecified fracture of left calcaneus, initial encounter for closed fracture: Secondary | ICD-10-CM | POA: Diagnosis not present

## 2018-03-25 DIAGNOSIS — F419 Anxiety disorder, unspecified: Secondary | ICD-10-CM | POA: Diagnosis present

## 2018-03-25 DIAGNOSIS — R0781 Pleurodynia: Secondary | ICD-10-CM | POA: Diagnosis not present

## 2018-03-25 DIAGNOSIS — W19XXXA Unspecified fall, initial encounter: Secondary | ICD-10-CM | POA: Diagnosis not present

## 2018-03-25 DIAGNOSIS — I495 Sick sinus syndrome: Secondary | ICD-10-CM | POA: Diagnosis not present

## 2018-03-25 DIAGNOSIS — I341 Nonrheumatic mitral (valve) prolapse: Secondary | ICD-10-CM | POA: Diagnosis present

## 2018-03-25 DIAGNOSIS — S32020A Wedge compression fracture of second lumbar vertebra, initial encounter for closed fracture: Secondary | ICD-10-CM | POA: Diagnosis not present

## 2018-03-25 DIAGNOSIS — S299XXA Unspecified injury of thorax, initial encounter: Secondary | ICD-10-CM | POA: Diagnosis not present

## 2018-03-25 DIAGNOSIS — J449 Chronic obstructive pulmonary disease, unspecified: Secondary | ICD-10-CM | POA: Diagnosis not present

## 2018-03-25 DIAGNOSIS — R42 Dizziness and giddiness: Secondary | ICD-10-CM | POA: Diagnosis not present

## 2018-03-25 DIAGNOSIS — R609 Edema, unspecified: Secondary | ICD-10-CM | POA: Diagnosis not present

## 2018-03-25 DIAGNOSIS — S9032XA Contusion of left foot, initial encounter: Secondary | ICD-10-CM | POA: Diagnosis not present

## 2018-03-25 HISTORY — DX: Presence of cardiac pacemaker: Z95.0

## 2018-03-25 LAB — COMPREHENSIVE METABOLIC PANEL
ALT: 18 U/L (ref 0–44)
AST: 25 U/L (ref 15–41)
Albumin: 3.8 g/dL (ref 3.5–5.0)
Alkaline Phosphatase: 67 U/L (ref 38–126)
Anion gap: 10 (ref 5–15)
BILIRUBIN TOTAL: 0.6 mg/dL (ref 0.3–1.2)
BUN: 18 mg/dL (ref 8–23)
CHLORIDE: 105 mmol/L (ref 98–111)
CO2: 27 mmol/L (ref 22–32)
Calcium: 9.5 mg/dL (ref 8.9–10.3)
Creatinine, Ser: 0.72 mg/dL (ref 0.44–1.00)
GFR calc Af Amer: >60 mL/min (ref >60)
Glucose, Bld: 93 mg/dL (ref 70–99)
Potassium: 3.1 mmol/L — ABNORMAL LOW (ref 3.5–5.1)
Sodium: 142 mmol/L (ref 135–145)
Total Protein: 6.7 g/dL (ref 6.5–8.1)

## 2018-03-25 LAB — MAGNESIUM: Magnesium: 2.2 mg/dL (ref 1.7–2.4)

## 2018-03-25 LAB — CBC WITH DIFFERENTIAL/PLATELET
Basophils Absolute: 0.1 10*3/uL (ref 0.0–0.1)
Basophils Relative: 1 %
EOS ABS: 0.1 10*3/uL (ref 0.0–0.7)
EOS PCT: 0 %
HCT: 41 % (ref 36.0–46.0)
HEMOGLOBIN: 13.8 g/dL (ref 12.0–15.0)
LYMPHS ABS: 3.6 10*3/uL (ref 0.7–4.0)
LYMPHS PCT: 28 %
MCH: 30.5 pg (ref 26.0–34.0)
MCHC: 33.7 g/dL (ref 30.0–36.0)
MCV: 90.7 fL (ref 78.0–100.0)
MONOS PCT: 6 %
Monocytes Absolute: 0.8 10*3/uL (ref 0.1–1.0)
Neutro Abs: 8.5 10*3/uL — ABNORMAL HIGH (ref 1.7–7.7)
Neutrophils Relative %: 65 %
PLATELETS: 328 10*3/uL (ref 150–400)
RBC: 4.52 MIL/uL (ref 3.87–5.11)
RDW: 14.8 % (ref 11.5–15.5)
WBC: 12.9 10*3/uL — AB (ref 4.0–10.5)

## 2018-03-25 LAB — URINALYSIS, ROUTINE W REFLEX MICROSCOPIC
BILIRUBIN URINE: NEGATIVE
Glucose, UA: NEGATIVE mg/dL
HGB URINE DIPSTICK: NEGATIVE
Ketones, ur: NEGATIVE mg/dL
NITRITE: NEGATIVE
PH: 9 — AB (ref 5.0–8.0)
Protein, ur: NEGATIVE mg/dL
SPECIFIC GRAVITY, URINE: 1.006 (ref 1.005–1.030)

## 2018-03-25 LAB — I-STAT TROPONIN, ED: TROPONIN I, POC: 0 ng/mL (ref 0.00–0.08)

## 2018-03-25 LAB — TROPONIN I: Troponin I: <0.03 ng/mL (ref <0.03)

## 2018-03-25 MED ORDER — HYDROCODONE-ACETAMINOPHEN 5-325 MG PO TABS: 1.0000 | ORAL_TABLET | Freq: Once | ORAL | Status: DC

## 2018-03-25 MED ORDER — POTASSIUM CHLORIDE CRYS ER 20 MEQ PO TBCR
40.0000 meq | EXTENDED_RELEASE_TABLET | Freq: Once | ORAL | Status: AC
  Administered 2018-03-25: 40 meq via ORAL
  Filled 2018-03-25: qty 2

## 2018-03-25 MED ORDER — MORPHINE SULFATE (PF) 4 MG/ML IV SOLN
4.0000 mg | Freq: Once | INTRAVENOUS | Status: AC
  Administered 2018-03-25: 4 mg via INTRAVENOUS
  Filled 2018-03-25: qty 1

## 2018-03-25 MED ORDER — POTASSIUM CHLORIDE 10 MEQ/100ML IV SOLN
10.0000 meq | INTRAVENOUS | Status: AC
  Administered 2018-03-25: 10 meq via INTRAVENOUS
  Filled 2018-03-25 (×2): qty 100

## 2018-03-25 MED ORDER — ONDANSETRON HCL 4 MG/2ML IJ SOLN
4.0000 mg | Freq: Once | INTRAMUSCULAR | Status: AC
  Administered 2018-03-25: 4 mg via INTRAVENOUS
  Filled 2018-03-25: qty 2

## 2018-03-25 MED ORDER — TRAMADOL HCL 50 MG PO TABS
50.0000 mg | ORAL_TABLET | Freq: Once | ORAL | Status: AC
  Administered 2018-03-25: 50 mg via ORAL
  Filled 2018-03-25: qty 1

## 2018-03-25 NOTE — ED Notes (Signed)
Bed: RW11 Expected date: 03/25/18 Expected time: 5:31 PM Means of arrival:  Comments: Fall, bil feet

## 2018-03-25 NOTE — ED Provider Notes (Addendum)
[facility] DEPT Provider Note   CSN: [id number] Arrival date & time: 03/25/18  1739     History   Chief Complaint No chief complaint on file.   HPI Laurie Klein is a 84 y.o. female history of anxiety, dementia here presenting with fall.  Patient is currently residing at the independent living facility.  Patient states that she lost her balance and fell directly on her left buttock.  She felt dizzy before this. Complaining of some left leg pain as well as hip pain and back pain.  Patient had a recent fall about a week ago and had lab work done and CT head that was unremarkable.  Patient denies hitting her head currently has no headaches. She was seen a week ago for syncope in the ED. Saw Dr. Lovena Le yesterday and loop recorder was interrogated and she has episodes of bradycardia to the 30s and plan for pacemaker.   The history is provided by the patient.    Past Medical History:  Diagnosis Date  . Anxiety   . Chronic headaches    history of  . DDD (degenerative disc disease) 05/24/2013  . Degenerative disc disease   . Dyslipidemia   . Headache(784.0): chronic 05/24/2013  . Heart palpitations   . Hypothyroidism   . Mitral valve prolapse   . MVP (mitral valve prolapse) 05/24/2013  . PAC (premature atrial contraction)   . Presence of permanent cardiac pacemaker 03/27/2018  . Spinal stenosis   . TMJ syndrome     Patient Active Problem List   Diagnosis Date Noted  . Closed left ankle fracture 03/28/2018  . Leukocytosis 03/27/2018  . Symptomatic bradycardia 03/25/2018  . Dementia ([place]) 03/25/2018  . Contusion of right periocular region   . Great toe pain, left   . Syncope 04/19/2017  . Syncope and collapse 05/24/2013  . MVP (mitral valve prolapse) 05/24/2013  . Anxiety 05/24/2013  . Dyslipidemia 05/24/2013  . Hypothyroidism 05/24/2013  . Headache(784.0): chronic 05/24/2013  . Spinal stenosis 05/24/2013  . PAC (premature atrial contraction)  05/24/2013  . DDD (degenerative disc disease) 05/24/2013    Past Surgical History:  Procedure Laterality Date  . APPENDECTOMY    . BACK SURGERY    . BREAST BIOPSY  3 times  . BREAST EXCISIONAL BIOPSY Right 1970   benign  . BREAST EXCISIONAL BIOPSY Left 1950   2 benign  . BREAST EXCISIONAL BIOPSY Right 1950   benign  . CHOLECYSTECTOMY    . LOOP RECORDER INSERTION N/A 01/17/2018   Procedure: LOOP RECORDER INSERTION;  Surgeon: Evans Lance, MD;  Location: [facility];  Service: Cardiovascular;  Laterality: N/A;  . LOOP RECORDER REMOVAL N/A 03/27/2018   Procedure: LOOP RECORDER REMOVAL;  Surgeon: Evans Lance, MD;  Location: [facility];  Service: Cardiovascular;  Laterality: N/A;  . LUMBAR MICRODISCECTOMY    . PACEMAKER IMPLANT N/A 03/27/2018   Procedure: PACEMAKER IMPLANT;  Surgeon: Evans Lance, MD;  Location: [facility];  Service: Cardiovascular;  Laterality: N/A;  . TONSILLECTOMY       OB History   None      Home Medications    Prior to Admission medications   Medication Sig Start Date End Date Taking? Authorizing Provider  Calcium Citrate (CITRACAL PO) Take 1 tablet by mouth daily.    Yes [provider]  cholecalciferol (VITAMIN D) 1000 UNITS tablet Take 1,000 Units by mouth daily.    Yes [provider]  co-enzyme Q-10 30 MG capsule Take 100 mg by mouth daily.    Yes [provider]  donepezil (ARICEPT) 10 MG tablet Take 10 mg by mouth at bedtime.   Yes [provider]  fish oil-omega-3 fatty acids 1000 MG capsule Take 2 g by mouth daily.    Yes [provider]  Multiple Vitamin (MULTIVITAMIN) capsule Take 1 capsule by mouth daily.   Yes [provider]  simvastatin (ZOCOR) 20 MG tablet Take 20 mg by mouth every evening.   Yes [provider]  SYNTHROID 75 MCG tablet Take 75 mcg by mouth daily. 02/04/17  Yes [provider]  tetrahydrozoline (VISINE) 0.05 % ophthalmic  solution Place 3 drops into both eyes daily as needed (For dry eyes).    Yes [provider]  acetaminophen (TYLENOL) 325 MG tablet Take 2 tablets (650 mg total) by mouth every 4 (four) hours as needed for mild pain or fever. 03/29/18   Roxan Hockey, MD  HYDROcodone-acetaminophen (NORCO/VICODIN) 5-325 MG tablet Take 1 tablet by mouth every 4 (four) hours as needed for moderate pain. 03/29/18   Roxan Hockey, MD  polyethylene glycol (MIRALAX / GLYCOLAX) packet Take 17 g by mouth daily as needed for mild constipation. 03/29/18   Roxan Hockey, MD    Family History Family History  Problem Relation Age of Onset  . Heart failure Mother 65  . Stroke Mother   . Heart disease Father   . Melanoma Child     Social History Social History   Tobacco Use  . Smoking status: Former Smoker    Packs/day: 0.50    Types: Cigarettes    Last attempt to quit: 10/03/1973    Years since quitting: 44.5  . Smokeless tobacco: Never Used  Substance Use Topics  . Alcohol use: Yes    Alcohol/week: 3.0 standard drinks    Types: 3 Standard drinks or equivalent per week    Comment: 4 glasses wine per week  . Drug use: No     Allergies   Acrylic polymer; Lipitor [atorvastatin calcium]; Orudis kt; Sulfa antibiotics; and Amoxicillin   Review of Systems Review of Systems  Musculoskeletal:       L leg pain, hip pain   All other systems reviewed and are negative.    Physical Exam Updated Vital Signs BP (!) 106/47 (BP Location: Left Arm)   Pulse 73   Temp 98.1 F (36.7 C) (Oral)   Resp 20   Ht 5\' 4"  (1.626 m)   Wt 57.1 kg   SpO2 96%   BMI 21.61 kg/m   Physical Exam  Constitutional: She is oriented to person, place, and time.  Chronically ill, demented   HENT:  Head: Normocephalic and atraumatic.  Mouth/Throat: Oropharynx is clear and moist.  No scalp hematoma   Eyes: Pupils are equal, round, and reactive to light. Conjunctivae and EOM are normal.  Neck: Normal range of motion.  Neck supple.  Cardiovascular: Normal rate, regular rhythm and normal heart sounds.  Pulmonary/Chest: Effort normal and breath sounds normal.  Ecchymosis R posterior lower rib area, mild tenderness, no deformity. Mild R paralumbar tenderness as well   Abdominal: Soft. Bowel sounds are normal. She exhibits no distension. There is no tenderness. There is no guarding.  Musculoskeletal:  No obvious hip deformity. Able to range both hips. Mild left lower tib/fib tenderness, mild L foot tenderness but no deformity. Neurovascular intact in all extremities   Neurological: She is alert and oriented to person, place,  and time.  Skin: Skin is warm.  Psychiatric: She has a normal mood and affect.  Nursing note and vitals reviewed.    ED Treatments / Results  Labs (all labs ordered are listed, but only abnormal results are displayed) Labs Reviewed  URINALYSIS, ROUTINE W REFLEX MICROSCOPIC - Abnormal; Notable for the following components:      Result Value   Color, Urine STRAW (*)    pH 9.0 (*)    Leukocytes, UA MODERATE (*)    Bacteria, UA RARE (*)    All other components within normal limits  CBC WITH DIFFERENTIAL/PLATELET - Abnormal; Notable for the following components:   WBC 12.9 (*)    Neutro Abs 8.5 (*)    All other components within normal limits  COMPREHENSIVE METABOLIC PANEL - Abnormal; Notable for the following components:   Potassium 3.1 (*)    All other components within normal limits  TSH - Abnormal; Notable for the following components:   TSH 6.518 (*)    All other components within normal limits  COMPREHENSIVE METABOLIC PANEL - Abnormal; Notable for the following components:   Glucose, Bld 100 (*)    Total Protein 5.8 (*)    Albumin 3.2 (*)    AST 136 (*)    ALT 76 (*)    All other components within normal limits  CBC - Abnormal; Notable for the following components:   WBC 12.5 (*)    All other components within normal limits  SURGICAL PCR SCREEN  TROPONIN I  MAGNESIUM   MAGNESIUM  PHOSPHORUS  CBC  I-STAT TROPONIN, ED    EKG EKG Interpretation  Date/Time:  Saturday March 25 2018 20:18:49 EDT Ventricular Rate:  53 PR Interval:    QRS Duration: 108 QT Interval:  463 QTC Calculation: 435 R Axis:   43 Text Interpretation:  Sinus rhythm Short PR interval Right atrial enlargement Artifact No significant change since last tracing Confirmed by Merrily Pew [phone]) on 03/27/2018 8:49:06 AM   Radiology No results found.  Procedures Procedures (including critical care time)  CRITICAL CARE Performed by: Wandra Arthurs   Total critical care time: 30 minutes  Critical care time was exclusive of separately billable procedures and treating other patients.  Critical care was necessary to treat or prevent imminent or life-threatening deterioration.  Critical care was time spent personally by me on the following activities: development of treatment plan with patient and/or surrogate as well as nursing, discussions with consultants, evaluation of patient's response to treatment, examination of patient, obtaining history from patient or surrogate, ordering and performing treatments and interventions, ordering and review of laboratory studies, ordering and review of radiographic studies, pulse oximetry and re-evaluation of patient's condition.   Medications Ordered in ED Medications  potassium chloride 10 mEq in 100 mL IVPB ( Intravenous Paused 03/26/18 0124)  0.9 %  sodium chloride infusion ( Intravenous Stopped 03/26/18 0846)  mupirocin ointment (BACTROBAN) 2 % (  Not Given 03/27/18 1144)  traMADol (ULTRAM) tablet 50 mg (50 mg Oral Given 03/25/18 1908)  morphine 4 MG/ML injection 4 mg (4 mg Intravenous Given 03/25/18 2050)  ondansetron (ZOFRAN) injection 4 mg (4 mg Intravenous Given 03/25/18 2140)  potassium chloride SA (K-DUR,KLOR-CON) CR tablet 40 mEq (40 mEq Oral Given 03/25/18 2306)  gentamicin (GARAMYCIN) 80 mg in sodium chloride 0.9 % 500 mL irrigation  (80 mg Irrigation Given 03/27/18 1235)  chlorhexidine (HIBICLENS) 4 % liquid 4 application (0 application Topical Duplicate 4/78/29 5621)  vancomycin (VANCOCIN) IVPB 1000 mg/200  mL premix (0 mg Intravenous Stopped 03/27/18 1243)  vancomycin (VANCOCIN) IVPB 1000 mg/200 mL premix (0 mg Intravenous Stopped 03/27/18 2232)     Initial Impression / Assessment and Plan / ED Course  I have reviewed the triage vital signs and the nursing notes.  Pertinent labs & imaging results that were available during my care of the patient were reviewed by me and considered in my medical decision making (see chart for details).    Laurie Klein is a 84 y.o. female here with fall, L hip and leg pain. Has bruising R posterior rib as well that I suspect likely from recent fall a week ago. She was dizzy before then. Given recent loop recorder interrogation that showed bradycardic episodes of 30s, I am concerned for symptomatic bradycardia. Will repeat labs, get xrays, EKG.   8 pm I talked to Dr. Charissa Bash from cardiology. He reviewed records and agreed with admission. He request hospitalist service and transfer to [facility].   9:39 pm xrays showed no fracture. No arrhythmias on monitor currently. Labs unremarkable. Will admit for near syncope likely from symptomatic bradycardia.    Final Clinical Impressions(s) / ED Diagnoses   Final diagnoses:  Symptomatic bradycardia    ED Discharge Orders         Ordered    polyethylene glycol (MIRALAX / GLYCOLAX) packet  Daily PRN     03/29/18 1247    acetaminophen (TYLENOL) 325 MG tablet  Every 4 hours PRN     03/29/18 1247    HYDROcodone-acetaminophen (NORCO/VICODIN) 5-325 MG tablet  Every 4 hours PRN     03/29/18 1247    Increase activity slowly     03/29/18 1247    Diet - low sodium heart healthy     03/29/18 1247    Discharge instructions    Comments:  1) weightbearing as tolerated in fracture boot with physical therapy today.   2)follow-up with orthopedics in 3 weeks  for clinical recheck.  3) Okay to not be in the fracture boot when she is in bed but when she is walking it would help her to get around to be in the fracture boot.   4) okay to weight-bear in the fracture boot.  5) left upper extremity restrictions as advised by cardiology team due to status post pacemaker placement 6) outpatient cardiology follow-up as advised by cardiology team   03/29/18 1247    Call MD for:  temperature >100.4     03/29/18 1247    Call MD for:  persistant nausea and vomiting     03/29/18 1247    Call MD for:  severe uncontrolled pain     03/29/18 1247    Call MD for:  difficulty breathing, headache or visual disturbances     03/29/18 1247    Call MD for:  redness, tenderness, or signs of infection (pain, swelling, redness, odor or green/yellow discharge around incision site)     03/29/18 1247    Call MD for:  persistant dizziness or light-headedness     03/29/18 1247           Drenda Freeze, MD 03/25/18 2139    Drenda Freeze, MD 04/04/18 [phone]

## 2018-03-25 NOTE — ED Notes (Signed)
Patient transported to X-ray 

## 2018-03-25 NOTE — Progress Notes (Signed)
[facility] Summary Report / Loop Recorder 

## 2018-03-25 NOTE — ED Notes (Signed)
[facility] has arrived to transport pt to [facility]

## 2018-03-25 NOTE — Progress Notes (Signed)
Received report from [facility] about patient coming to [id number]. Lajoyce Corners, RN

## 2018-03-25 NOTE — H&P (Signed)
Laurie Klein DXI:[id number] DOB: Dec 15, 1930 DOA: 03/25/2018     PCP: Lajean Manes, MD   Outpatient Specialists:  CARDS: Dr. Lovena Le     Patient arrived to ER on 03/25/18 at 38  Patient coming from:  From facility [place] [facility]  Chief Complaint: fall HPI: Laurie Klein is a 84 y.o. female with medical history significant of anxiety, dementia, DDD, hypothyroidism, spinal stenosis  Presented with a  Fall  Patient reports was sitting in her lazy chair and got up the next thing she knew she fell. She is unsure if she passed out or not. She had similar symptoms in the past. She is unsure if she had true LOC. Patient is uncertain stating " I am not sure I guess I could have passed out"  [place] on her left side resulting in the left leg pain and hip pain she had prior falls last week she had possible syncope  and at that time   patient was seen in the emergency department  had work-up done and CT head was unremarkable.     Patient  has loop recorder in place since July given history of unexplained infrequent syncopal episodes/falls  in the past. Loop recorder was integrated yesterday how she was with cardiology office for follow-up and was noted that patient has episodes of bradycardia with heart rate going down to 30s. Plan for patient to have pacemaker done by cardiology in the near future. Today she felt lightheaded prior to the fall, no chest pain. No LOC no head injury. This time patient is being admitted for presumed recurrent syncope in the setting of known bradycardia plan for pacemaker placement.  Reports occasional chest pain states currently feeling better. Slightly confused.   Regarding pertinent Chronic problems: History of hyperlipidemia on Zocor   While in ER: Imaging showed no evidence of fracture While in the emergency department heart rate maintained between 50-60 with no evidence of hypertension  the following Work up has been ordered so far:  Orders Placed This Encounter    Procedures  . DG Ribs Unilateral W/Chest Right  . DG Lumbar Spine Complete  . DG Hip Unilat W or Wo Pelvis 2-3 Views Right  . DG Tibia/Fibula Left  . DG Foot Complete Left  . Urinalysis, Routine w reflex microscopic  . CBC with Differential/Platelet  . Comprehensive metabolic panel  . Inpatient consult to Cardiology  . Consult to hospitalist  . I-stat troponin, ED  . EKG 12-Lead  . EKG 12-Lead    Following Medications were ordered in ER: Medications  traMADol (ULTRAM) tablet 50 mg (50 mg Oral Given 03/25/18 1908)  morphine 4 MG/ML injection 4 mg (4 mg Intravenous Given 03/25/18 2050)  ondansetron (ZOFRAN) injection 4 mg (4 mg Intravenous Given 03/25/18 2140)    Significant initial  Findings: Abnormal Labs Reviewed  URINALYSIS, ROUTINE W REFLEX MICROSCOPIC - Abnormal; Notable for the following components:      Result Value   Color, Urine STRAW (*)    pH 9.0 (*)    Leukocytes, UA MODERATE (*)    Bacteria, UA RARE (*)    All other components within normal limits  CBC WITH DIFFERENTIAL/PLATELET - Abnormal; Notable for the following components:   WBC 12.9 (*)    Neutro Abs 8.5 (*)    All other components within normal limits  COMPREHENSIVE METABOLIC PANEL - Abnormal; Notable for the following components:   Potassium 3.1 (*)    All other components within normal limits  Na 142 K 3.1  Cr   Stable  Lab Results  Component Value Date   CREATININE 0.72 03/25/2018   CREATININE 0.69 03/24/2018   CREATININE 0.73 03/20/2018     WBC  12.9  HG/HCT 13.8 stable,       Component Value Date/Time   HGB 13.8 03/25/2018 2024   HGB 13.3 03/24/2018 1155   HCT 41.0 03/25/2018 2024   HCT 39.9 03/24/2018 1155    Troponin (Point of Care Test) Recent Labs    03/25/18 2101  TROPIPOC 0.00     BNP (last 3 results) No results for input(s): BNP in the last 8760 hours.  ProBNP (last 3 results) No results for input(s): PROBNP in the last 8760 hours.  Lactic Acid, Venous No results  found for: LATICACIDVEN    UA   ordered  Left Tibia-  neg Left Foot - neg CXR cardiomegaly not  acute    Lumbar - Stable compression FRX at L2 Left hip no FRX  ECG:  Personally reviewed by me showing: HR : 53 Rhythm:   Sinus bradycardia     no evidence of ischemic changes [id number]     ED Triage Vitals  Enc Vitals Group     BP 03/25/18 1754 (!) 145/59     Pulse Rate 03/25/18 1754 62     Resp 03/25/18 1754 16     Temp 03/25/18 1754 (!) 97.5 F (36.4 C)     Temp Source 03/25/18 1754 Oral     SpO2 03/25/18 1747 99 %     Weight --      Height --      Head Circumference --      Peak Flow --      Pain Score 03/25/18 1753 10     Pain Loc --      Pain Edu? --      Excl. in [place]? --   TMAX(24)@       Latest  Blood pressure (!) 146/80, pulse (!) 56, temperature (!) 97.5 F (36.4 C), temperature source Oral, resp. rate (!) 28, SpO2 100 %.    ER Provider Called:   Dr. Charissa Bash from cardiology. They Recommend admit to [facility] Will see on arrival  Hospitalist was called for admission for syncope  In the  setting of symptomatic bradycardia   Review of Systems:    Pertinent positives include: falls, , dizziness,  Constitutional:  No weight loss, night sweats, Fevers, chills, fatigue, weight loss  HEENT:  No headaches, Difficulty swallowing,Tooth/dental problems,Sore throat,  No sneezing, itching, ear ache, nasal congestion, post nasal drip,  Cardio-vascular:  No chest pain, Orthopnea, PND, anasarca palpitations.no Bilateral lower extremity swelling  GI:  No heartburn, indigestion, abdominal pain, nausea, vomiting, diarrhea, change in bowel habits, loss of appetite, melena, blood in stool, hematemesis Resp:  no shortness of breath at rest. No dyspnea on exertion, No excess mucus, no productive cough, No non-productive cough, No coughing up of blood.No change in color of mucus.No wheezing. Skin:  no rash or lesions. No jaundice GU:  no dysuria, change in color of  urine, no urgency or frequency. No straining to urinate.  No flank pain.  Musculoskeletal:  No joint pain or no joint swelling. No decreased range of motion. No back pain.  Psych:  No change in mood or affect. No depression or anxiety. No memory loss.  Neuro: no localizing neurological complaints, no tingling, no weakness, no double vision, no gait abnormality, no slurred speech, no confusion  All systems reviewed and apart from [place] all are negative  Past Medical History:   Past Medical History:  Diagnosis Date  . Anxiety   . Chronic headaches    history of  . DDD (degenerative disc disease) 05/24/2013  . Degenerative disc disease   . Dyslipidemia   . Headache(784.0): chronic 05/24/2013  . Heart palpitations   . Hypothyroidism   . Mitral valve prolapse   . MVP (mitral valve prolapse) 05/24/2013  . PAC (premature atrial contraction)   . Spinal stenosis   . TMJ syndrome       Past Surgical History:  Procedure Laterality Date  . APPENDECTOMY    . BREAST BIOPSY  3 times  . BREAST EXCISIONAL BIOPSY Right 1970   benign  . BREAST EXCISIONAL BIOPSY Left 1950   2 benign  . BREAST EXCISIONAL BIOPSY Right 1950   benign  . CHOLECYSTECTOMY    . disectomy     micro lumbar  . LOOP RECORDER INSERTION N/A 01/17/2018   Procedure: LOOP RECORDER INSERTION;  Surgeon: Evans Lance, MD;  Location: [facility];  Service: Cardiovascular;  Laterality: N/A;  . TONSILLECTOMY      Social History:  Ambulatory walker      reports that she quit smoking about 44 years ago. Her smoking use included cigarettes. She smoked 0.50 packs per day. She has never used smokeless tobacco. She reports that she drinks about 3.0 standard drinks of alcohol per week. She reports that she does not use drugs.    Family History:   Family History  Problem Relation Age of Onset  . Heart failure Mother 52  . Stroke Mother   . Heart disease Father   . Melanoma Child     Allergies: Allergies    Allergen Reactions  . Acrylic Polymer Other (See Comments)    Blisters inside mouth   . Lipitor [Atorvastatin Calcium] Other (See Comments)    Myalgias and muscle aches   . Orudis Kt Other (See Comments)    Unknown   . Sulfa Antibiotics Itching  . Amoxicillin Itching and Rash    Has patient had a PCN reaction causing immediate rash, facial/tongue/throat swelling, SOB or lightheadedness with hypotension: Yes Has patient had a PCN reaction causing severe rash involving mucus membranes or skin necrosis: No Has patient had a PCN reaction that required hospitalization: No Has patient had a PCN reaction occurring within the last 10 years: No If all of the above answers are "NO", then may proceed with Cephalosporin use.      Prior to Admission medications   Medication Sig Start Date End Date Taking? Authorizing Provider  Calcium Citrate (CITRACAL PO) Take 1 tablet by mouth daily.    Yes [provider]  cholecalciferol (VITAMIN D) 1000 UNITS tablet Take 1,000 Units by mouth daily.    Yes [provider]  co-enzyme Q-10 30 MG capsule Take 100 mg by mouth daily.    Yes [provider]  donepezil (ARICEPT) 10 MG tablet Take 10 mg by mouth at bedtime.   Yes [provider]  fish oil-omega-3 fatty acids 1000 MG capsule Take 2 g by mouth daily.    Yes [provider]  Multiple Vitamin (MULTIVITAMIN) capsule Take 1 capsule by mouth daily.   Yes [provider]  simvastatin (ZOCOR) 20 MG tablet Take 20 mg by mouth every evening.   Yes [provider]  SYNTHROID 75 MCG tablet Take 75 mcg by mouth daily. 02/04/17  Yes [provider]  tetrahydrozoline (VISINE) 0.05 % ophthalmic solution Place 3 drops into both eyes daily as needed (For dry eyes).    Yes [provider]   Physical Exam: Blood pressure (!) 146/80, pulse (!) 56, temperature (!) 97.5 F (36.4 C), temperature source Oral, resp. rate (!) 28, SpO2 100 %. 1.  General:  in No Acute distress  well  -appearing 2. Psychological: Alert and   Oriented to self, situation and place 3. Head/ENT:     Dry Mucous Membranes                          Head Non traumatic, neck supple                           Poor Dentition 4. SKIN:  decreased Skin turgor,  Skin clean Dry and intact no rash 5. Heart: Regular rate and rhythm no  Murmur, no Rub or gallop 6. Lungs: Clear to auscultation bilaterally, no wheezes or crackles   7. Abdomen: Soft,  non-tender, Non distended  obese   bowel sounds present 8. Lower extremities: no clubbing, cyanosis, or edema 9. Neurologically Grossly intact, moving all 4 extremities equally  10. MSK: Normal range of motion   LABS:     Recent Labs  Lab 03/20/18 0850 03/24/18 1155 03/25/18 2024  WBC 6.5 9.4 12.9*  NEUTROABS 3.6 5.6 8.5*  HGB 13.4 13.3 13.8  HCT 40.3 39.9 41.0  MCV 92.6 91 90.7  PLT 280 333 425   Basic Metabolic Panel: Recent Labs  Lab 03/20/18 1025 03/24/18 1155 03/25/18 2024  NA 141 138 142  K 3.9 4.6 3.1*  CL 107 98 105  CO2 22 24 27   GLUCOSE 98 86 93  BUN 11 12 18   CREATININE 0.73 0.69 0.72  CALCIUM 8.7* 10.1 9.5      Recent Labs  Lab 03/25/18 2024  AST 25  ALT 18  ALKPHOS 67  BILITOT 0.6  PROT 6.7  ALBUMIN 3.8   No results for input(s): LIPASE, AMYLASE in the last 168 hours. No results for input(s): AMMONIA in the last 168 hours.    HbA1C: No results for input(s): HGBA1C in the last 72 hours. CBG: Recent Labs  Lab 03/20/18 0910  GLUCAP 71      Urine analysis:    Component Value Date/Time   COLORURINE STRAW (A) 03/25/2018 2024   APPEARANCEUR CLEAR 03/25/2018 2024   LABSPEC 1.006 03/25/2018 2024   PHURINE 9.0 (H) 03/25/2018 2024   GLUCOSEU NEGATIVE 03/25/2018 2024   HGBUR NEGATIVE 03/25/2018 2024   BILIRUBINUR NEGATIVE 03/25/2018 2024   KETONESUR NEGATIVE 03/25/2018 2024   PROTEINUR NEGATIVE 03/25/2018 2024   UROBILINOGEN 0.2 09/20/2013 0859   NITRITE NEGATIVE  03/25/2018 2024   LEUKOCYTESUR MODERATE (A) 03/25/2018 2024       Cultures:    Component Value Date/Time   SDES URINE, CLEAN CATCH 05/24/2013 1750   SPECREQUEST NONE 05/24/2013 1750   CULT NO GROWTH Performed at [facility] 05/24/2013 1750   REPTSTATUS 05/25/2013 FINAL 05/24/2013 1750     Radiological Exams on Admission: Dg Ribs Unilateral W/chest Right  Result Date: 03/25/2018 CLINICAL DATA:  Fall, rib pain on the right EXAM: RIGHT RIBS AND CHEST - 3+ VIEW COMPARISON:  03/20/2018 FINDINGS: Loop recorder device projects over the left chest. Heart is mildly enlarged. No confluent airspace opacities or effusions. No visible rib fracture. No pneumothorax.  IMPRESSION: Cardiomegaly.  No active disease. No visible rib fracture. Electronically Signed   By: Rolm Baptise M.D.   On: 03/25/2018 18:57   Dg Lumbar Spine Complete  Result Date: 03/25/2018 CLINICAL DATA:  Fall. EXAM: LUMBAR SPINE - COMPLETE 4+ VIEW COMPARISON:  Abdomen and pelvis CT 09/24/2017 FINDINGS: L2 compression fracture present since prior CT of 09/24/2017 although there may be some or interval loss of vertebral body height. Sclerotic focus in the L1 vertebral body is stable. Degenerative disc disease at L3-4, L4-5, and L5-S1 is similar to prior. SI joints are unremarkable. Bones are diffusely demineralized. IMPRESSION: L2 compression fracture with probable loss of height since 09/24/2017. Degenerative disc disease in the lower lumbar spine without evidence of new fracture. Electronically Signed   By: Misty Stanley M.D.   On: 03/25/2018 18:59   Dg Tibia/fibula Left  Result Date: 03/25/2018 CLINICAL DATA:  Fall EXAM: LEFT TIBIA AND FIBULA - 2 VIEW COMPARISON:  None. FINDINGS: There is no evidence of fracture or other focal bone lesions. Soft tissues are unremarkable. IMPRESSION: Negative. Electronically Signed   By: Rolm Baptise M.D.   On: 03/25/2018 20:43   Dg Foot Complete Left  Result Date: 03/25/2018 CLINICAL DATA:   Fall, foot bruising, pain EXAM: LEFT FOOT - COMPLETE 3+ VIEW COMPARISON:  None. FINDINGS: No acute bony abnormality. Specifically, no fracture, subluxation, or dislocation. Joint spaces maintained. Soft tissues intact. IMPRESSION: No acute bony abnormality. Electronically Signed   By: Rolm Baptise M.D.   On: 03/25/2018 20:43   Dg Hip Unilat W Or Wo Pelvis 2-3 Views Right  Result Date: 03/25/2018 CLINICAL DATA:  Right hip pain following a fall today. EXAM: DG HIP (WITH OR WITHOUT PELVIS) 2-3V RIGHT COMPARISON:  None. FINDINGS: Normal appearing right hip without fracture or dislocation. Lower lumbar spine degenerative changes. IMPRESSION: No fracture or dislocation. Lower lumbar spine degenerative changes. Electronically Signed   By: Claudie Revering M.D.   On: 03/25/2018 18:58    Chart has been reviewed    Assessment/Plan  84 y.o. female with medical history significant of anxiety, dementia, DDD, hypothyroidism, spinal stenosis  Admitted for symptomatic bradycardia  Present on Admission: . Symptomatic bradycardia -appreciate cardiology consult.  Will transfer to [facility] for father evaluation and possible pacemaker.  Make n.p.o. postmidnight.  Continue to monitor on telemetry.  Check TSH cycle cardiac enzymes and obtain echogram . Dyslipidemia -stable continue home medications . Hypothyroidism - - Check TSH continue home medications at current dose  . Syncope questionable syncope in the setting of history of symptomatic bradycardia.  Admit to telemetry continue to monitor appreciate cardiology consult please see plan above . Dementia -continue Aricept watch for any signs of sundowning while hospitalized Hypokalemia - - will replace and repeat in AM,  check magnesium level and replace as needed    Other plan as per orders.  DVT prophylaxis:  SCD    Code Status:   DNR/DNI  as per patient. States she has DNR on her fridge I had personally discussed CODE STATUS with patient    Family  Communication:   Family not at  Bedside    Disposition Plan:                            Back to current facility when stable  Would benefit from PT/OT eval prior to DC  Ordered                                     Social Work  consulted                                      Consults called: Cardiology   Admission status:   inpatient     Expect 2 midnight stay secondary to severity of patient's current illness including hemodynamic instability despite optimal treatment (bradycardia )   Severe lab abnormalities including hypokalemia and   comorbidities including:    dementia   That are currently affecting medical management.  I expect  patient to be hospitalized for 2 midnights requiring inpatient medical care.  Patient is at high risk for adverse outcome (such as loss of life or disability) if not treated.  Indication for inpatient stay as follows:  Hemodynamic instability despite maximal medical therapy requiring pacemaker placement         Level of care    tele  For 24H            Avaneesh Pepitone 03/25/2018, 10:40 PM    [facility]  Pager [phone]   after 2 AM please page floor coverage PA If 7AM-7PM, please contact the day team taking care of the patient  [email or web address]  Password [email or web address]

## 2018-03-25 NOTE — ED Notes (Signed)
ED TO INPATIENT HANDOFF REPORT  Name/Age/Gender Laurie Klein 84 y.o. female  Code Status Code Status History    Date Active Date Inactive Code Status Order ID Comments User Context   04/19/2017 2004 04/20/2017 2230 DNR [id number]  Elodia Florence., MD Inpatient   05/25/2013 1128 05/26/2013 1644 DNR [id number]  Bonnielee Haff, MD Inpatient   05/24/2013 1539 05/25/2013 1128 Full Code [id number]  Eugenie Filler, MD Inpatient    Questions for Most Recent Historical Code Status (Order [id number])    Question Answer Comment   In the event of cardiac or respiratory ARREST Do not call a "code blue"    In the event of cardiac or respiratory ARREST Do not perform Intubation, CPR, defibrillation or ACLS    In the event of cardiac or respiratory ARREST Use medication by any route, position, wound care, and other measures to relive pain and suffering. May use oxygen, suction and manual treatment of airway obstruction as needed for comfort.         Advance Directive Documentation     Most Recent Value  Type of Advance Directive  Out of facility DNR (pink MOST or yellow form)  Pre-existing out of facility DNR order (yellow form or pink MOST form)  -  "MOST" Form in Place?  -      Home/SNF/Other Nursing Home  Chief Complaint fall  Level of Care/Admitting Diagnosis ED Disposition    ED Disposition Condition [place]: [facility] [100100]  Level of Care: Telemetry [5]  Diagnosis: Symptomatic bradycardia [742595]  Admitting Physician: Toy Baker [[id number]]  Attending Physician: Toy Baker [[id number]]  Estimated length of stay: past midnight tomorrow  Certification:: I certify this patient will need inpatient services for at least 2 midnights  PT Class (Do Not Modify): Inpatient [101]  PT Acc Code (Do Not Modify): Private [1]       Medical History Past Medical History:  Diagnosis Date  . Anxiety   . Chronic headaches    history  of  . DDD (degenerative disc disease) 05/24/2013  . Degenerative disc disease   . Dyslipidemia   . Headache(784.0): chronic 05/24/2013  . Heart palpitations   . Hypothyroidism   . Mitral valve prolapse   . MVP (mitral valve prolapse) 05/24/2013  . PAC (premature atrial contraction)   . Spinal stenosis   . TMJ syndrome     Allergies Allergies  Allergen Reactions  . Acrylic Polymer Other (See Comments)    Blisters inside mouth   . Lipitor [Atorvastatin Calcium] Other (See Comments)    Myalgias and muscle aches   . Orudis Kt Other (See Comments)    Unknown   . Sulfa Antibiotics Itching  . Amoxicillin Itching and Rash    Has patient had a PCN reaction causing immediate rash, facial/tongue/throat swelling, SOB or lightheadedness with hypotension: Yes Has patient had a PCN reaction causing severe rash involving mucus membranes or skin necrosis: No Has patient had a PCN reaction that required hospitalization: No Has patient had a PCN reaction occurring within the last 10 years: No If all of the above answers are "NO", then may proceed with Cephalosporin use.     IV Location/Drains/Wounds Patient Lines/Drains/Airways Status   Active Line/Drains/Airways    Name:   Placement date:   Placement time:   Site:   Days:   Peripheral IV 03/25/18 Right Antecubital   03/25/18    2049    Antecubital   less  than 1          Labs/Imaging Results for orders placed or performed during the hospital encounter of 03/25/18 (from the past 48 hour(s))  Urinalysis, Routine w reflex microscopic     Status: Abnormal   Collection Time: 03/25/18  8:24 PM  Result Value Ref Range   Color, Urine STRAW (A) YELLOW   APPearance CLEAR CLEAR   Specific Gravity, Urine 1.006 1.005 - 1.030   pH 9.0 (H) 5.0 - 8.0   Glucose, UA NEGATIVE NEGATIVE mg/dL   Hgb urine dipstick NEGATIVE NEGATIVE   Bilirubin Urine NEGATIVE NEGATIVE   Ketones, ur NEGATIVE NEGATIVE mg/dL   Protein, ur NEGATIVE NEGATIVE mg/dL    Nitrite NEGATIVE NEGATIVE   Leukocytes, UA MODERATE (A) NEGATIVE   RBC / HPF 0-5 0 - 5 RBC/hpf   WBC, UA 0-5 0 - 5 WBC/hpf   Bacteria, UA RARE (A) NONE SEEN   Squamous Epithelial / LPF 0-5 0 - 5    Comment: Performed at [facility], [place] [address], [place], [place] [address]  CBC with Differential/Platelet     Status: Abnormal   Collection Time: 03/25/18  8:24 PM  Result Value Ref Range   WBC 12.9 (H) 4.0 - 10.5 K/uL   RBC 4.52 3.87 - 5.11 MIL/uL   Hemoglobin 13.8 12.0 - 15.0 g/dL   HCT 41.0 36.0 - 46.0 %   MCV 90.7 78.0 - 100.0 fL   MCH 30.5 26.0 - 34.0 pg   MCHC 33.7 30.0 - 36.0 g/dL   RDW 14.8 11.5 - 15.5 %   Platelets 328 150 - 400 K/uL   Neutrophils Relative % 65 %   Neutro Abs 8.5 (H) 1.7 - 7.7 K/uL   Lymphocytes Relative 28 %   Lymphs Abs 3.6 0.7 - 4.0 K/uL   Monocytes Relative 6 %   Monocytes Absolute 0.8 0.1 - 1.0 K/uL   Eosinophils Relative 0 %   Eosinophils Absolute 0.1 0.0 - 0.7 K/uL   Basophils Relative 1 %   Basophils Absolute 0.1 0.0 - 0.1 K/uL    Comment: Performed at [facility], [place] [address] [place], [place] [address]  Comprehensive metabolic panel     Status: Abnormal   Collection Time: 03/25/18  8:24 PM  Result Value Ref Range   Sodium 142 135 - 145 mmol/L   Potassium 3.1 (L) 3.5 - 5.1 mmol/L   Chloride 105 98 - 111 mmol/L   CO2 27 22 - 32 mmol/L   Glucose, Bld 93 70 - 99 mg/dL   BUN 18 8 - 23 mg/dL   Creatinine, Ser 0.72 0.44 - 1.00 mg/dL   Calcium 9.5 8.9 - 10.3 mg/dL   Total Protein 6.7 6.5 - 8.1 g/dL   Albumin 3.8 3.5 - 5.0 g/dL   AST 25 15 - 41 U/L   ALT 18 0 - 44 U/L   Alkaline Phosphatase 67 38 - 126 U/L   Total Bilirubin 0.6 0.3 - 1.2 mg/dL   GFR calc non Af Amer >60 >60 mL/min   GFR calc Af Amer >60 >60 mL/min    Comment: (NOTE) The eGFR has been calculated using the CKD EPI equation. This calculation has not been validated in all clinical situations. eGFR's persistently <60 mL/min signify  possible Chronic Kidney Disease.    Anion gap 10 5 - 15    Comment: Performed at [facility], [place] [address] [place], [place] [address]  I-stat troponin, ED  Status: None   Collection Time: 03/25/18  9:01 PM  Result Value Ref Range   Troponin i, poc 0.00 0.00 - 0.08 ng/mL   Comment 3            Comment: Due to the release kinetics of cTnI, a negative result within the first hours of the onset of symptoms does not rule out myocardial infarction with certainty. If myocardial infarction is still suspected, repeat the test at appropriate intervals.    Dg Ribs Unilateral W/chest Right  Result Date: 03/25/2018 CLINICAL DATA:  Fall, rib pain on the right EXAM: RIGHT RIBS AND CHEST - 3+ VIEW COMPARISON:  03/20/2018 FINDINGS: Loop recorder device projects over the left chest. Heart is mildly enlarged. No confluent airspace opacities or effusions. No visible rib fracture. No pneumothorax. IMPRESSION: Cardiomegaly.  No active disease. No visible rib fracture. Electronically Signed   By: Rolm Baptise M.D.   On: 03/25/2018 18:57   Dg Lumbar Spine Complete  Result Date: 03/25/2018 CLINICAL DATA:  Fall. EXAM: LUMBAR SPINE - COMPLETE 4+ VIEW COMPARISON:  Abdomen and pelvis CT 09/24/2017 FINDINGS: L2 compression fracture present since prior CT of 09/24/2017 although there may be some or interval loss of vertebral body height. Sclerotic focus in the L1 vertebral body is stable. Degenerative disc disease at L3-4, L4-5, and L5-S1 is similar to prior. SI joints are unremarkable. Bones are diffusely demineralized. IMPRESSION: L2 compression fracture with probable loss of height since 09/24/2017. Degenerative disc disease in the lower lumbar spine without evidence of new fracture. Electronically Signed   By: Misty Stanley M.D.   On: 03/25/2018 18:59   Dg Tibia/fibula Left  Result Date: 03/25/2018 CLINICAL DATA:  Fall EXAM: LEFT TIBIA AND FIBULA - 2 VIEW COMPARISON:  None. FINDINGS: There  is no evidence of fracture or other focal bone lesions. Soft tissues are unremarkable. IMPRESSION: Negative. Electronically Signed   By: Rolm Baptise M.D.   On: 03/25/2018 20:43   Dg Foot Complete Left  Result Date: 03/25/2018 CLINICAL DATA:  Fall, foot bruising, pain EXAM: LEFT FOOT - COMPLETE 3+ VIEW COMPARISON:  None. FINDINGS: No acute bony abnormality. Specifically, no fracture, subluxation, or dislocation. Joint spaces maintained. Soft tissues intact. IMPRESSION: No acute bony abnormality. Electronically Signed   By: Rolm Baptise M.D.   On: 03/25/2018 20:43   Dg Hip Unilat W Or Wo Pelvis 2-3 Views Right  Result Date: 03/25/2018 CLINICAL DATA:  Right hip pain following a fall today. EXAM: DG HIP (WITH OR WITHOUT PELVIS) 2-3V RIGHT COMPARISON:  None. FINDINGS: Normal appearing right hip without fracture or dislocation. Lower lumbar spine degenerative changes. IMPRESSION: No fracture or dislocation. Lower lumbar spine degenerative changes. Electronically Signed   By: Claudie Revering M.D.   On: 03/25/2018 18:58    Pending Labs Unresulted Labs (From admission, onward)    Start     Ordered   03/25/18 2211  Troponin I (q 6hr x 3)  Now then every 6 hours,   R     03/25/18 2210   03/25/18 2211  Magnesium  Add-on,   R     03/25/18 2210   Signed and Held  Magnesium  Tomorrow morning,   R    Comments:  Call MD if <1.5    Signed and Held   Signed and Held  Phosphorus  Tomorrow morning,   R     Signed and Held   Signed and Held  TSH  Once,   R    Comments:  Cancel if already done within 1 month and notify MD    Signed and Held   Signed and Held  Comprehensive metabolic panel  Once,   R    Comments:  Cal MD for K<3.5 or >5.0    Signed and Held   Signed and Held  CBC  Once,   R    Comments:  Call for hg <8.0    Signed and Held          Vitals/Pain Today's Vitals   03/25/18 2110 03/25/18 2130 03/25/18 2200 03/25/18 2230  BP:  (!) 146/80 (!) 151/60 (!) 164/77  Pulse:  (!) 56 (!) 52 64   Resp:  (!) 28 (!) 21 (!) 21  Temp:      TempSrc:      SpO2:  100% 98% 100%  PainSc: 2        Isolation Precautions No active isolations  Medications Medications  potassium chloride SA (K-DUR,KLOR-CON) CR tablet 40 mEq (has no administration in time range)  potassium chloride 10 mEq in 100 mL IVPB (has no administration in time range)  traMADol (ULTRAM) tablet 50 mg (50 mg Oral Given 03/25/18 1908)  morphine 4 MG/ML injection 4 mg (4 mg Intravenous Given 03/25/18 2050)  ondansetron (ZOFRAN) injection 4 mg (4 mg Intravenous Given 03/25/18 2140)    Mobility walks

## 2018-03-25 NOTE — ED Notes (Signed)
Pt is on a Purewick. Will collect urine sample when pt voids.

## 2018-03-25 NOTE — ED Triage Notes (Signed)
Patient coming from independent living apartment with c/o fall. Pt state she loss her balance and fell on her buttock and she have bruise to her feet. Pt denies being on blood thinners. Pt denies hitting her head. Pt A&O x 4.

## 2018-03-26 DIAGNOSIS — F0390 Unspecified dementia without behavioral disturbance: Secondary | ICD-10-CM

## 2018-03-26 DIAGNOSIS — R55 Syncope and collapse: Secondary | ICD-10-CM

## 2018-03-26 DIAGNOSIS — R001 Bradycardia, unspecified: Principal | ICD-10-CM

## 2018-03-26 LAB — CBC
HEMATOCRIT: 39.3 % (ref 36.0–46.0)
Hemoglobin: 12.9 g/dL (ref 12.0–15.0)
MCH: 30.6 pg (ref 26.0–34.0)
MCHC: 32.8 g/dL (ref 30.0–36.0)
MCV: 93.1 fL (ref 78.0–100.0)
Platelets: 277 10*3/uL (ref 150–400)
RBC: 4.22 MIL/uL (ref 3.87–5.11)
RDW: 14.6 % (ref 11.5–15.5)
WBC: 12.5 10*3/uL — AB (ref 4.0–10.5)

## 2018-03-26 LAB — COMPREHENSIVE METABOLIC PANEL
ALT: 76 U/L — AB (ref 0–44)
AST: 136 U/L — AB (ref 15–41)
Albumin: 3.2 g/dL — ABNORMAL LOW (ref 3.5–5.0)
Alkaline Phosphatase: 77 U/L (ref 38–126)
Anion gap: 9 (ref 5–15)
BUN: 14 mg/dL (ref 8–23)
CO2: 25 mmol/L (ref 22–32)
CREATININE: 0.76 mg/dL (ref 0.44–1.00)
Calcium: 8.9 mg/dL (ref 8.9–10.3)
Chloride: 104 mmol/L (ref 98–111)
GFR calc Af Amer: >60 mL/min (ref >60)
Glucose, Bld: 100 mg/dL — ABNORMAL HIGH (ref 70–99)
POTASSIUM: 4.9 mmol/L (ref 3.5–5.1)
Sodium: 138 mmol/L (ref 135–145)
Total Bilirubin: 0.6 mg/dL (ref 0.3–1.2)
Total Protein: 5.8 g/dL — ABNORMAL LOW (ref 6.5–8.1)

## 2018-03-26 LAB — TSH: TSH: 6.518 u[IU]/mL — AB (ref 0.350–4.500)

## 2018-03-26 LAB — PHOSPHORUS: PHOSPHORUS: 3.8 mg/dL (ref 2.5–4.6)

## 2018-03-26 LAB — MAGNESIUM: Magnesium: 2 mg/dL (ref 1.7–2.4)

## 2018-03-26 MED ORDER — SIMVASTATIN 20 MG PO TABS
20.0000 mg | ORAL_TABLET | Freq: Every evening | ORAL | Status: DC
  Administered 2018-03-26 – 2018-03-28 (×3): 20 mg via ORAL
  Filled 2018-03-26 (×3): qty 1

## 2018-03-26 MED ORDER — ONDANSETRON HCL 4 MG PO TABS: 4.0000 mg | ORAL_TABLET | Freq: Four times a day (QID) | ORAL | Status: DC | PRN

## 2018-03-26 MED ORDER — ACETAMINOPHEN 325 MG PO TABS
650.0000 mg | ORAL_TABLET | Freq: Four times a day (QID) | ORAL | Status: DC | PRN
  Administered 2018-03-26 (×2): 650 mg via ORAL
  Filled 2018-03-26 (×2): qty 2

## 2018-03-26 MED ORDER — ACETAMINOPHEN 650 MG RE SUPP: 650.0000 mg | Freq: Four times a day (QID) | RECTAL | Status: DC | PRN

## 2018-03-26 MED ORDER — HYDROCODONE-ACETAMINOPHEN 5-325 MG PO TABS
1.0000 | ORAL_TABLET | ORAL | Status: DC | PRN
  Administered 2018-03-27: 1 via ORAL
  Administered 2018-03-28: 2 via ORAL
  Administered 2018-03-28 (×2): 1 via ORAL
  Filled 2018-03-26 (×3): qty 1
  Filled 2018-03-26: qty 2

## 2018-03-26 MED ORDER — SODIUM CHLORIDE 0.9 % IV SOLN: INTRAVENOUS | Status: AC

## 2018-03-26 MED ORDER — LEVOTHYROXINE SODIUM 75 MCG PO TABS
75.0000 ug | ORAL_TABLET | Freq: Every day | ORAL | Status: DC
  Administered 2018-03-26 – 2018-03-29 (×4): 75 ug via ORAL
  Filled 2018-03-26 (×4): qty 1

## 2018-03-26 MED ORDER — ONDANSETRON HCL 4 MG/2ML IJ SOLN: 4.0000 mg | Freq: Four times a day (QID) | INTRAMUSCULAR | Status: DC | PRN

## 2018-03-26 MED ORDER — DONEPEZIL HCL 5 MG PO TABS
10.0000 mg | ORAL_TABLET | Freq: Every day | ORAL | Status: DC
  Administered 2018-03-26 – 2018-03-28 (×4): 10 mg via ORAL
  Filled 2018-03-26 (×4): qty 2

## 2018-03-26 MED ORDER — POLYETHYLENE GLYCOL 3350 17 G PO PACK
17.0000 g | PACK | Freq: Every day | ORAL | Status: DC | PRN
  Administered 2018-03-28: 17 g via ORAL
  Filled 2018-03-26: qty 1

## 2018-03-26 NOTE — Consult Note (Signed)
Cardiology Consultation:   Patient ID: Laurie Klein MRN: [id number]; DOB: [birth year]  Admit date: 03/25/2018 Date of Consult: 03/26/2018  Primary Care Provider: Lajean Manes, MD Primary Cardiologist: No primary care provider on file.  Primary Electrophysiologist:  Lovena Le   Patient Profile:   Laurie Klein is a 84 y.o. female with a hx of syncope who is being seen today for the evaluation of near syncope at the request of [place].  History of Present Illness:   Laurie Klein is an 84 year old female who presented to the hospital after an episode of lower extremity weakness and possible syncope.  She was getting out of her lazy chair in the next thing she knew her legs got weak.  She did have a fall.  She is unsure if she had an episode of syncope.  She has had prior falls in the past.  She had a CT of the head that was unremarkable.  She says that she felt well prior to the episode, but it took her potentially an hour to feel better.  She did have episodes of bradycardia on loop monitor.  There is a plan for pacemaker implant.  All in the ER, her heart rate was in the 50s to 60s without evidence of heart block.  Past Medical History:  Diagnosis Date  . Anxiety   . Chronic headaches    history of  . DDD (degenerative disc disease) 05/24/2013  . Degenerative disc disease   . Dyslipidemia   . Headache(784.0): chronic 05/24/2013  . Heart palpitations   . Hypothyroidism   . Mitral valve prolapse   . MVP (mitral valve prolapse) 05/24/2013  . PAC (premature atrial contraction)   . Spinal stenosis   . TMJ syndrome     Past Surgical History:  Procedure Laterality Date  . APPENDECTOMY    . BREAST BIOPSY  3 times  . BREAST EXCISIONAL BIOPSY Right 1970   benign  . BREAST EXCISIONAL BIOPSY Left 1950   2 benign  . BREAST EXCISIONAL BIOPSY Right 1950   benign  . CHOLECYSTECTOMY    . disectomy     micro lumbar  . LOOP RECORDER INSERTION N/A 01/17/2018   Procedure: LOOP RECORDER  INSERTION;  Surgeon: Evans Lance, MD;  Location: [facility];  Service: Cardiovascular;  Laterality: N/A;  . TONSILLECTOMY       Home Medications:  Prior to Admission medications   Medication Sig Start Date End Date Taking? Authorizing Provider  Calcium Citrate (CITRACAL PO) Take 1 tablet by mouth daily.    Yes [provider]  cholecalciferol (VITAMIN D) 1000 UNITS tablet Take 1,000 Units by mouth daily.    Yes [provider]  co-enzyme Q-10 30 MG capsule Take 100 mg by mouth daily.    Yes [provider]  donepezil (ARICEPT) 10 MG tablet Take 10 mg by mouth at bedtime.   Yes [provider]  fish oil-omega-3 fatty acids 1000 MG capsule Take 2 g by mouth daily.    Yes [provider]  Multiple Vitamin (MULTIVITAMIN) capsule Take 1 capsule by mouth daily.   Yes [provider]  simvastatin (ZOCOR) 20 MG tablet Take 20 mg by mouth every evening.   Yes [provider]  SYNTHROID 75 MCG tablet Take 75 mcg by mouth daily. 02/04/17  Yes [provider]  tetrahydrozoline (VISINE) 0.05 % ophthalmic solution Place 3 drops into both eyes daily as needed (For dry eyes).    Yes [provider]    Inpatient Medications: Scheduled Meds: . donepezil  10 mg Oral QHS  . levothyroxine  75 mcg Oral QAC breakfast  . simvastatin  20 mg Oral QPM   Continuous Infusions: . sodium chloride 75 mL/hr at 03/26/18 0052   PRN Meds: acetaminophen **OR** acetaminophen, HYDROcodone-acetaminophen, ondansetron **OR** ondansetron (ZOFRAN) IV, polyethylene glycol  Allergies:    Allergies  Allergen Reactions  . Acrylic Polymer Other (See Comments)    Blisters inside mouth   . Lipitor [Atorvastatin Calcium] Other (See Comments)    Myalgias and muscle aches   . Orudis Kt Other (See Comments)    Unknown   . Sulfa Antibiotics Itching  . Amoxicillin Itching and Rash    Has patient had a PCN reaction causing immediate rash,  facial/tongue/throat swelling, SOB or lightheadedness with hypotension: Yes Has patient had a PCN reaction causing severe rash involving mucus membranes or skin necrosis: No Has patient had a PCN reaction that required hospitalization: No Has patient had a PCN reaction occurring within the last 10 years: No If all of the above answers are "NO", then may proceed with Cephalosporin use.     Social History:   Social History   Socioeconomic History  . Marital status: Married    Spouse name: Not on file  . Number of children: Not on file  . Years of education: Not on file  . Highest education level: Not on file  Occupational History  . Occupation: retired Transport planner  . Financial resource strain: Not on file  . Food insecurity:    Worry: Not on file    Inability: Not on file  . Transportation needs:    Medical: Not on file    Non-medical: Not on file  Tobacco Use  . Smoking status: Former Smoker    Packs/day: 0.50    Types: Cigarettes    Last attempt to quit: 10/03/1973    Years since quitting: 44.5  . Smokeless tobacco: Never Used  Substance and Sexual Activity  . Alcohol use: Yes    Alcohol/week: 3.0 standard drinks    Types: 3 drink(s) per week    Comment: 4 glasses wine per week  . Drug use: No  . Sexual activity: Not on file  Lifestyle  . Physical activity:    Days per week: Not on file    Minutes per session: Not on file  . Stress: Not on file  Relationships  . Social connections:    Talks on phone: Not on file    Gets together: Not on file    Attends religious service: Not on file    Active member of club or organization: Not on file    Attends meetings of clubs or organizations: Not on file    Relationship status: Not on file  . Intimate partner violence:    Fear of current or ex partner: Not on file    Emotionally abused: Not on file    Physically abused: Not on file    Forced sexual activity: Not on file  Other Topics Concern  . Not on file    Social History Narrative  . Not on file    Family History:    Family History  Problem Relation Age of Onset  . Heart failure Mother 86  . Stroke Mother   . Heart disease Father   . Melanoma Child      ROS:  Please see the history of present illness.   All other ROS  reviewed and negative.     Physical Exam/Data:   Vitals:   03/25/18 2300 03/25/18 2330 03/26/18 0019 03/26/18 0335  BP: (!) 155/66 (!) 153/63 134/61 (!) 121/38  Pulse: 61 (!) 57 (!) 58 (!) 57  Resp: 16 11 15 13   Temp:   98.2 F (36.8 C) 98.9 F (37.2 C)  TempSrc:   Oral Oral  SpO2: 94% 99% 100% 99%  Weight:   57.1 kg   Height:   5\' 4"  (1.626 m)     Intake/Output Summary (Last 24 hours) at 03/26/2018 0751 Last data filed at 03/26/2018 0400 Gross per 24 hour  Intake 424.01 ml  Output -  Net 424.01 ml   Filed Weights   03/26/18 0019  Weight: 57.1 kg   Body mass index is 21.61 kg/m.  General:  Well nourished, well developed, in no acute distress HEENT: normal Lymph: no adenopathy Neck: no JVD Endocrine:  No thryomegaly Vascular: No carotid bruits; FA pulses 2+ bilaterally without bruits  Cardiac:  normal S1, S2; RRR; no murmur  Lungs:  clear to auscultation bilaterally, no wheezing, rhonchi or rales  Abd: soft, nontender, no hepatomegaly  Ext: no edema Musculoskeletal:  No deformities, BUE and BLE strength normal and equal Skin: warm and dry  Neuro:  CNs 2-12 intact, no focal abnormalities noted Psych:  Normal affect   EKG:  The EKG was personally reviewed and demonstrates:  SR, rate 53 Telemetry:  Telemetry was personally reviewed and demonstrates:  SR  Relevant CV Studies: TTE 04/20/18 - Left ventricle: The cavity size was normal. Wall thickness was   normal. Systolic function was normal. The estimated ejection   fraction was in the range of 60% to 65%. Wall motion was normal;   there were no regional wall motion abnormalities. Doppler   parameters are consistent with abnormal left  ventricular   relaxation (grade 1 diastolic dysfunction). - Aortic valve: There was trivial regurgitation.  Laboratory Data:  Chemistry Recent Labs  Lab 03/20/18 1025 03/24/18 1155 03/25/18 2024 03/26/18 0327  NA 141 138 142 138  K 3.9 4.6 3.1* 4.9  CL 107 98 105 104  CO2 22 24 27 25   GLUCOSE 98 86 93 100*  BUN 11 12 18 14   CREATININE 0.73 0.69 0.72 0.76  CALCIUM 8.7* 10.1 9.5 8.9  GFRNONAA >60 80 >60 >60  GFRAA >60 92 >60 >60  ANIONGAP 12  --  10 9    Recent Labs  Lab 03/25/18 2024 03/26/18 0327  PROT 6.7 5.8*  ALBUMIN 3.8 3.2*  AST 25 136*  ALT 18 76*  ALKPHOS 67 77  BILITOT 0.6 0.6   Hematology Recent Labs  Lab 03/24/18 1155 03/25/18 2024 03/26/18 0327  WBC 9.4 12.9* 12.5*  RBC 4.37 4.52 4.22  HGB 13.3 13.8 12.9  HCT 39.9 41.0 39.3  MCV 91 90.7 93.1  MCH 30.4 30.5 30.6  MCHC 33.3 33.7 32.8  RDW 13.7 14.8 14.6  PLT 333 328 277   Cardiac Enzymes Recent Labs  Lab 03/25/18 2256  TROPONINI <0.03    Recent Labs  Lab 03/20/18 0901 03/25/18 2101  TROPIPOC 0.00 0.00    BNPNo results for input(s): BNP, PROBNP in the last 168 hours.  DDimer No results for input(s): DDIMER in the last 168 hours.  Radiology/Studies:  Dg Ribs Unilateral W/chest Right  Result Date: 03/25/2018 CLINICAL DATA:  Fall, rib pain on the right EXAM: RIGHT RIBS AND CHEST - 3+ VIEW COMPARISON:  03/20/2018 FINDINGS: Loop recorder device  projects over the left chest. Heart is mildly enlarged. No confluent airspace opacities or effusions. No visible rib fracture. No pneumothorax. IMPRESSION: Cardiomegaly.  No active disease. No visible rib fracture. Electronically Signed   By: Rolm Baptise M.D.   On: 03/25/2018 18:57   Dg Lumbar Spine Complete  Result Date: 03/25/2018 CLINICAL DATA:  Fall. EXAM: LUMBAR SPINE - COMPLETE 4+ VIEW COMPARISON:  Abdomen and pelvis CT 09/24/2017 FINDINGS: L2 compression fracture present since prior CT of 09/24/2017 although there may be some or interval loss  of vertebral body height. Sclerotic focus in the L1 vertebral body is stable. Degenerative disc disease at L3-4, L4-5, and L5-S1 is similar to prior. SI joints are unremarkable. Bones are diffusely demineralized. IMPRESSION: L2 compression fracture with probable loss of height since 09/24/2017. Degenerative disc disease in the lower lumbar spine without evidence of new fracture. Electronically Signed   By: Misty Stanley M.D.   On: 03/25/2018 18:59   Dg Tibia/fibula Left  Result Date: 03/25/2018 CLINICAL DATA:  Fall EXAM: LEFT TIBIA AND FIBULA - 2 VIEW COMPARISON:  None. FINDINGS: There is no evidence of fracture or other focal bone lesions. Soft tissues are unremarkable. IMPRESSION: Negative. Electronically Signed   By: Rolm Baptise M.D.   On: 03/25/2018 20:43   Dg Foot Complete Left  Result Date: 03/25/2018 CLINICAL DATA:  Fall, foot bruising, pain EXAM: LEFT FOOT - COMPLETE 3+ VIEW COMPARISON:  None. FINDINGS: No acute bony abnormality. Specifically, no fracture, subluxation, or dislocation. Joint spaces maintained. Soft tissues intact. IMPRESSION: No acute bony abnormality. Electronically Signed   By: Rolm Baptise M.D.   On: 03/25/2018 20:43   Dg Hip Unilat W Or Wo Pelvis 2-3 Views Right  Result Date: 03/25/2018 CLINICAL DATA:  Right hip pain following a fall today. EXAM: DG HIP (WITH OR WITHOUT PELVIS) 2-3V RIGHT COMPARISON:  None. FINDINGS: Normal appearing right hip without fracture or dislocation. Lower lumbar spine degenerative changes. IMPRESSION: No fracture or dislocation. Lower lumbar spine degenerative changes. Electronically Signed   By: Claudie Revering M.D.   On: 03/25/2018 18:58    Assessment and Plan:   1. Symptomatic bradycardia: Patient had an episode of syncope with plans for pacemaker and Linq monitor explant in the future.  Byrdie Miyazaki discuss with her primary elective physiologist whether or not this can be done tomorrow.  She otherwise is feeling well now.  It could be that her episode  of near syncope prior to this admission was due to significant bradycardia. 2. Hyperlipidemia: Continue home medications 3. Hypothyroidism: Per primary care.      For questions or updates, please contact [place] Please consult [email or web address] for contact info under     Signed, Lashundra Shiveley Meredith Leeds, MD  03/26/2018 7:51 AM

## 2018-03-26 NOTE — Evaluation (Signed)
Occupational Therapy Evaluation Patient Details Name: Laurie Klein MRN: [id number] DOB: Aug 27, 1930 Today's Date: 03/26/2018    History of Present Illness Laurie Klein is a 84 y.o. year old female with medical history significant for anxiety, dementia, hypothyroidism, and syncope s/p ILR implant  who presented on 03/25/2018 with fall and was found to have symptomatic bradycardia.  Work-up to date troponin trend has been negative, Infection work-up unremarkable.  Since presenting after a fall x-ray in the ED of left tibia, left foot, ribs, lumbar spine, hip were negative for any acute fracture   Clinical Impression   Pt admitted with above, and demonstrates the below listed deficits.  She is severely limited by bil. Ankle pain and thus requires max - total A for LB ADLs.  She lives at [facility] [place] and was mod I with ADLs.  She required assist for IADLs.  If pt undergoes pacemaker placement, she will have subsequent precautions, which will make functional mobility and ADLs even more challenging.  At this time, feel that she need SNF level rehab before transitioning to ILF - pt agrees.  All further OT needs can be addressed at SNF. Acute OT will sign off at this time.     Follow Up Recommendations  SNF    Equipment Recommendations  None recommended by OT    Recommendations for Other Services       Precautions / Restrictions Precautions Precautions: Fall Restrictions Other Position/Activity Restrictions: Pt unable to tolerate WBing over ankles/feet       Mobility Bed Mobility Overal bed mobility: Modified Independent                Transfers                 General transfer comment: Unable due to pain in B ankles     Balance                                           ADL either performed or assessed with clinical judgement   ADL Overall ADL's : Needs assistance/impaired Eating/Feeding: Independent   Grooming: Wash/dry hands;Wash/dry face;Oral  care;Brushing hair;Set up;Sitting   Upper Body Bathing: Set up;Bed level   Lower Body Bathing: Moderate assistance;Bed level   Upper Body Dressing : Minimal assistance;Sitting;Bed level   Lower Body Dressing: Total assistance;Bed level   Toilet Transfer: Total assistance Toilet Transfer Details (indicate cue type and reason): Pt unable to tolerate  Toileting- Clothing Manipulation and Hygiene: Total assistance;Bed level         General ADL Comments: Pt limited by severe ankle pain      Vision Patient Visual Report: No change from baseline       Perception Perception Perception Tested?: Yes   Praxis Praxis Praxis tested?: Within functional limits    Pertinent Vitals/Pain Pain Assessment: Faces Faces Pain Scale: Hurts whole lot Pain Location: B ankles with palpation and attempts to move  Pain Descriptors / Indicators: Sharp;Guarding Pain Intervention(s): Limited activity within patient's tolerance     Hand Dominance Right   Extremity/Trunk Assessment Upper Extremity Assessment Upper Extremity Assessment: Overall WFL for tasks assessed   Lower Extremity Assessment Lower Extremity Assessment: Defer to PT evaluation       Communication Communication Communication: No difficulties   Cognition Arousal/Alertness: Awake/alert Behavior During Therapy: WFL for tasks assessed/performed Overall Cognitive Status: History of cognitive impairments -  at baseline                                 General Comments: Pt with h/o dementia - no family present    General Comments  discussed recommendation for SNF level rehab and pt agrees     Exercises     Shoulder Instructions      Home Living Family/patient expects to be discharged to:: Other (Comment)(ILF at [facility])                             Home Equipment: Gilford Rile - 2 wheels;Grab bars - toilet;Grab bars - tub/shower          Prior Functioning/Environment Level of Independence:  Independent with assistive device(s)        Comments: intermittent use of RW for limited comunity ambulation. reports independence with ADLs, has assist for IADLs         OT Problem List: Decreased activity tolerance;Impaired balance (sitting and/or standing);Decreased knowledge of use of DME or AE;Pain      OT Treatment/Interventions:      OT Goals(Current goals can be found in the care plan section) Acute Rehab OT Goals Patient Stated Goal: to be able to get up without pain  OT Goal Formulation: All assessment and education complete, DC therapy  OT Frequency:     Barriers to D/C:            Co-evaluation              AM-PAC PT "6 Clicks" Daily Activity     Outcome Measure Help from another person eating meals?: None Help from another person taking care of personal grooming?: A Little Help from another person toileting, which includes using toliet, bedpan, or urinal?: Total Help from another person bathing (including washing, rinsing, drying)?: A Lot Help from another person to put on and taking off regular upper body clothing?: A Little Help from another person to put on and taking off regular lower body clothing?: Total 6 Click Score: 14   End of Session Nurse Communication: Mobility status  Activity Tolerance: Patient limited by pain Patient left: in bed;with call bell/phone within reach  OT Visit Diagnosis: Pain Pain - Right/Left: Right Pain - part of body: Ankle and joints of foot                Time: 1711-1732 OT Time Calculation (min): 21 min Charges:  OT General Charges $OT Visit: 1 Visit OT Evaluation $OT Eval Moderate Complexity: 1 Mod  Lucille Passy, OTR/L Acute Rehabilitation Services Pager [phone] Office [phone]   Lucille Passy M 03/26/2018, 6:28 PM

## 2018-03-26 NOTE — Progress Notes (Signed)
PROGRESS NOTE  Mihira Tozzi Aceituno YDX:[id number] DOB: [birth year] DOA: 03/25/2018 PCP: Lajean Manes, MD  HPI/Brief Narrative  Jossalyn Forgione Glynn is a 84 y.o. year old female with medical history significant for anxiety, dementia, hypothyroidism, and syncope s/p ILR implant  who presented on 03/25/2018 with fall and was found to have symptomatic bradycardia.  Work-up to date troponin trend has been negative, Infection work-up unremarkable.  Since presenting after a fall x-ray in the ED of left tibia, left foot, ribs, lumbar spine, hip were negative for any acute fracture  She is followed by Dr. Lovena Le (cardiology) as outpatient.  Last seen on 9/20 with plan to undergo outpatient permanent pacemaker before this admission given concern for sinus node dysfunction based off arrhythmia findings.  Subjective No complaints.   Assessment/Plan:  #History of sinus bradycardia.  Based off interrogation of patient's implantable loop recorder by cardiology it seems her fall is related to symptomatic bradycardia.  With reported heart rate down to the 30s.  Awaiting possible permanent pacemaker implantation this admission, TTE EF recommended by cardiology  #Dementia.  Alert and oriented.  Mental status baseline.  Delirium precautions. PTA aricept  #Hypokalemia, resolved.  Status post oral repletion on admission  #Hyperlipidemia, stable.  Continue Zocor  #Hypothyroidism, stable.  TSH at 6, appropriate for age.  Continue Synthroid    Code Status: DNR   Family Communication: no family at bedside   Disposition Plan: possible PPM implantation awaiting cardiology recs    Consultants:   Treatment Team:   Lbcardiology, Rounding, MD    Procedures:  TTE pending   Antimicrobials: Anti-infectives (From admission, onward)   None         Cultures:  none  Telemetry:yes  DVT prophylaxis: SCDs   Objective: Vitals:   03/25/18 2300 03/25/18 2330 03/26/18 0019 03/26/18 0335  BP: (!) 155/66 (!)  153/63 134/61 (!) 121/38  Pulse: 61 (!) 57 (!) 58 (!) 57  Resp: 16 11 15 13   Temp:   98.2 F (36.8 C) 98.9 F (37.2 C)  TempSrc:   Oral Oral  SpO2: 94% 99% 100% 99%  Weight:   57.1 kg   Height:   5\' 4"  (1.626 m)     Intake/Output Summary (Last 24 hours) at 03/26/2018 0758 Last data filed at 03/26/2018 0400 Gross per 24 hour  Intake 424.01 ml  Output -  Net 424.01 ml   Filed Weights   03/26/18 0019  Weight: 57.1 kg    Exam:  Constitutional:elderly female, no distress Eyes: EOMI, anicteric, normal conjunctivae ENMT: Oropharynx with moist mucous membranes, normal dentition Cardiovascular: regular rhythm, bradycardic, no MRGs, with no peripheral edema Respiratory: Normal respiratory effort, clear breath sounds  Skin: No rash ulcers, or lesions. Without skin tenting  Neurologic: Grossly no focal neuro deficit. Psychiatric:Appropriate affect, and mood. Mental status alert to self, place. Not to time( month or year)  Data Reviewed: CBC: Recent Labs  Lab 03/20/18 0850 03/24/18 1155 03/25/18 2024 03/26/18 0327  WBC 6.5 9.4 12.9* 12.5*  NEUTROABS 3.6 5.6 8.5*  --   HGB 13.4 13.3 13.8 12.9  HCT 40.3 39.9 41.0 39.3  MCV 92.6 91 90.7 93.1  PLT 280 333 328 720   Basic Metabolic Panel: Recent Labs  Lab 03/20/18 1025 03/24/18 1155 03/25/18 2024 03/25/18 2256 03/26/18 0327  NA 141 138 142  --  138  K 3.9 4.6 3.1*  --  4.9  CL 107 98 105  --  104  CO2 22 24 27   --  25  GLUCOSE 98 86 93  --  100*  BUN 11 12 18   --  14  CREATININE 0.73 0.69 0.72  --  0.76  CALCIUM 8.7* 10.1 9.5  --  8.9  MG  --   --   --  2.2 2.0  PHOS  --   --   --   --  3.8   GFR: Estimated Creatinine Clearance: 44.4 mL/min (by C-G formula based on SCr of 0.76 mg/dL). Liver Function Tests: Recent Labs  Lab 03/25/18 2024 03/26/18 0327  AST 25 136*  ALT 18 76*  ALKPHOS 67 77  BILITOT 0.6 0.6  PROT 6.7 5.8*  ALBUMIN 3.8 3.2*   No results for input(s): LIPASE, AMYLASE in the last 168  hours. No results for input(s): AMMONIA in the last 168 hours. Coagulation Profile: No results for input(s): INR, PROTIME in the last 168 hours. Cardiac Enzymes: Recent Labs  Lab 03/25/18 2256  TROPONINI <0.03   BNP (last 3 results) No results for input(s): PROBNP in the last 8760 hours. HbA1C: No results for input(s): HGBA1C in the last 72 hours. CBG: Recent Labs  Lab 03/20/18 0910  GLUCAP 71   Lipid Profile: No results for input(s): CHOL, HDL, LDLCALC, TRIG, CHOLHDL, LDLDIRECT in the last 72 hours. Thyroid Function Tests: Recent Labs    03/26/18 0327  TSH 6.518*   Anemia Panel: No results for input(s): VITAMINB12, FOLATE, FERRITIN, TIBC, IRON, RETICCTPCT in the last 72 hours. Urine analysis:    Component Value Date/Time   COLORURINE STRAW (A) 03/25/2018 2024   APPEARANCEUR CLEAR 03/25/2018 2024   LABSPEC 1.006 03/25/2018 2024   PHURINE 9.0 (H) 03/25/2018 2024   GLUCOSEU NEGATIVE 03/25/2018 2024   HGBUR NEGATIVE 03/25/2018 2024   BILIRUBINUR NEGATIVE 03/25/2018 2024   KETONESUR NEGATIVE 03/25/2018 2024   PROTEINUR NEGATIVE 03/25/2018 2024   UROBILINOGEN 0.2 09/20/2013 0859   NITRITE NEGATIVE 03/25/2018 2024   LEUKOCYTESUR MODERATE (A) 03/25/2018 2024   Sepsis Labs: @LABRCNTIP (procalcitonin:4,lacticidven:4)  )No results found for this or any previous visit (from the past 240 hour(s)).    Studies: Dg Ribs Unilateral W/chest Right  Result Date: 03/25/2018 CLINICAL DATA:  Fall, rib pain on the right EXAM: RIGHT RIBS AND CHEST - 3+ VIEW COMPARISON:  03/20/2018 FINDINGS: Loop recorder device projects over the left chest. Heart is mildly enlarged. No confluent airspace opacities or effusions. No visible rib fracture. No pneumothorax. IMPRESSION: Cardiomegaly.  No active disease. No visible rib fracture. Electronically Signed   By: Rolm Baptise M.D.   On: 03/25/2018 18:57   Dg Lumbar Spine Complete  Result Date: 03/25/2018 CLINICAL DATA:  Fall. EXAM: LUMBAR SPINE -  COMPLETE 4+ VIEW COMPARISON:  Abdomen and pelvis CT 09/24/2017 FINDINGS: L2 compression fracture present since prior CT of 09/24/2017 although there may be some or interval loss of vertebral body height. Sclerotic focus in the L1 vertebral body is stable. Degenerative disc disease at L3-4, L4-5, and L5-S1 is similar to prior. SI joints are unremarkable. Bones are diffusely demineralized. IMPRESSION: L2 compression fracture with probable loss of height since 09/24/2017. Degenerative disc disease in the lower lumbar spine without evidence of new fracture. Electronically Signed   By: Misty Stanley M.D.   On: 03/25/2018 18:59   Dg Tibia/fibula Left  Result Date: 03/25/2018 CLINICAL DATA:  Fall EXAM: LEFT TIBIA AND FIBULA - 2 VIEW COMPARISON:  None. FINDINGS: There is no evidence of fracture or other focal bone lesions. Soft tissues are unremarkable. IMPRESSION: Negative. Electronically Signed  By: Rolm Baptise M.D.   On: 03/25/2018 20:43   Dg Foot Complete Left  Result Date: 03/25/2018 CLINICAL DATA:  Fall, foot bruising, pain EXAM: LEFT FOOT - COMPLETE 3+ VIEW COMPARISON:  None. FINDINGS: No acute bony abnormality. Specifically, no fracture, subluxation, or dislocation. Joint spaces maintained. Soft tissues intact. IMPRESSION: No acute bony abnormality. Electronically Signed   By: Rolm Baptise M.D.   On: 03/25/2018 20:43   Dg Hip Unilat W Or Wo Pelvis 2-3 Views Right  Result Date: 03/25/2018 CLINICAL DATA:  Right hip pain following a fall today. EXAM: DG HIP (WITH OR WITHOUT PELVIS) 2-3V RIGHT COMPARISON:  None. FINDINGS: Normal appearing right hip without fracture or dislocation. Lower lumbar spine degenerative changes. IMPRESSION: No fracture or dislocation. Lower lumbar spine degenerative changes. Electronically Signed   By: Claudie Revering M.D.   On: 03/25/2018 18:58    Scheduled Meds: . donepezil  10 mg Oral QHS  . levothyroxine  75 mcg Oral QAC breakfast  . simvastatin  20 mg Oral QPM     Continuous Infusions: . sodium chloride 75 mL/hr at 03/26/18 0052     LOS: 1 day     Desiree Hane, MD [facility] Pager [phone]  If 7PM-7AM, please contact night-coverage [email or web address] Password [facility] 03/26/2018, 7:58 AM

## 2018-03-26 NOTE — Progress Notes (Signed)
Text paged cardiology to make them aware of patient's arrival to the floor. Lajoyce Corners, RN

## 2018-03-26 NOTE — Progress Notes (Signed)
Patient has arrived to [facility] via [facility] from [place]. CHG bath given, patient assessed, CCMD called and patient placed on telemetry. Patient oriented to room with call bell placed close by. Bed in lowest position. Will continue to monitor. Lajoyce Corners, RN

## 2018-03-26 NOTE — Evaluation (Addendum)
Physical Therapy Evaluation Patient Details Name: Laurie Klein MRN: [id number] DOB: 1931/01/26 Today's Date: 03/26/2018   History of Present Illness  Laurie Klein is a 84 y.o. year old female with medical history significant for anxiety, dementia, hypothyroidism, and syncope s/p ILR implant  who presented on 03/25/2018 with fall and was found to have symptomatic bradycardia.  Work-up to date troponin trend has been negative, Infection work-up unremarkable.  Since presenting after a fall x-ray in the ED of left tibia, left foot, ribs, lumbar spine, hip were negative for any acute fracture    Clinical Impression  Pt admitted with above diagnosis. Pt currently with functional limitations due to the deficits listed below (see PT Problem List). PTA, pt living at independent living with intermittent use of RW for limited community ambulation. Today, evaluation greatly limited by B ankle pain, unable to weight bear with bruising. Educated on reduction of swelling and light therex to promote healing of likely sprains. Introduced bed level therex. Pt demonstrated good mobility sitting up. Due to B ankle pain, likely pacemaker precautions, pt may be medically d/cing before she is ambulating safely and will benefit from SNF Pt will benefit from skilled PT to increase their independence and safety with mobility to allow discharge to the venue listed below.       Follow Up Recommendations  SNF (unless progresses quickly)    Equipment Recommendations  (TBD)    Recommendations for Other Services       Precautions / Restrictions Precautions Precautions: Fall Restrictions Weight Bearing Restrictions: Yes RLE Weight Bearing: Non weight bearing LLE Weight Bearing: Non weight bearing Other Position/Activity Restrictions: currentlty unable to bear weight due to possible ankle sprains      Mobility  Bed Mobility Overal bed mobility: Modified Independent                Transfers                  General transfer comment: Unable due to pain in B ankles   Ambulation/Gait                Stairs            Wheelchair Mobility    Modified Rankin (Stroke Patients Only)       Balance                                             Pertinent Vitals/Pain Pain Assessment: Faces Faces Pain Scale: Hurts whole lot Pain Location: B ankles Pain Descriptors / Indicators: Moaning;Sharp Pain Intervention(s): Limited activity within patient's tolerance;Monitored during session;Premedicated before session    Home Living Family/patient expects to be discharged to:: Assisted living               Home Equipment: Walker - 2 wheels;Grab bars - toilet;Grab bars - tub/shower      Prior Function Level of Independence: Independent with assistive device(s)         Comments: intermittent use of RW for limited comunity ambulation. reports independence with ADLs     Hand Dominance        Extremity/Trunk Assessment   Upper Extremity Assessment Upper Extremity Assessment: Overall WFL for tasks assessed    Lower Extremity Assessment Lower Extremity Assessment: Overall WFL for tasks assessed       Communication   Communication: No difficulties  Cognition  Arousal/Alertness: Awake/alert Behavior During Therapy: WFL for tasks assessed/performed Overall Cognitive Status: Within Functional Limits for tasks assessed                                        General Comments      Exercises General Exercises - Lower Extremity Ankle Circles/Pumps: 20 reps Quad Sets: 10 reps Long Arc Quad: 10 reps Hip ABduction/ADduction: 10 reps Straight Leg Raises: 10 reps Other Exercises Other Exercises: Ankle ABCs    Assessment/Plan    PT Assessment Patient needs continued PT services  PT Problem List Decreased strength;Decreased activity tolerance;Decreased range of motion;Decreased balance;Decreased mobility       PT Treatment  Interventions DME instruction;Gait training;Stair training;Functional mobility training;Therapeutic activities;Therapeutic exercise;Balance training    PT Goals (Current goals can be found in the Care Plan section)  Acute Rehab PT Goals Patient Stated Goal: less pain  PT Goal Formulation: With patient Time For Goal Achievement: 04/09/18 Potential to Achieve Goals: Good    Frequency Min 3X/week   Barriers to discharge        Co-evaluation               AM-PAC PT "6 Clicks" Daily Activity  Outcome Measure Difficulty turning over in bed (including adjusting bedclothes, sheets and blankets)?: None Difficulty moving from lying on back to sitting on the side of the bed? : None Difficulty sitting down on and standing up from a chair with arms (e.g., wheelchair, bedside commode, etc,.)?: Unable Help needed moving to and from a bed to chair (including a wheelchair)?: Total Help needed walking in hospital room?: Total Help needed climbing 3-5 steps with a railing? : Total 6 Click Score: 12    End of Session   Activity Tolerance: Patient limited by pain Patient left: in bed;with call bell/phone within reach;with bed alarm set Nurse Communication: Mobility status PT Visit Diagnosis: Unsteadiness on feet (R26.81);Pain;Muscle weakness (generalized) (M62.81);Repeated falls (R29.6) Pain - Right/Left: (BLE) Pain - part of body: Ankle and joints of foot    Time: 0258-5277 PT Time Calculation (min) (ACUTE ONLY): 29 min   Charges:   PT Evaluation $PT Eval Low Complexity: 1 Low PT Treatments $Therapeutic Exercise: 8-22 mins        Reinaldo Berber, PT, DPT Acute Rehabilitation Services Pager: [phone] Office: [phone]    Reinaldo Berber 03/26/2018, 3:03 PM

## 2018-03-26 NOTE — Consult Note (Signed)
Cardiology Consultation:   Patient ID: Laurie Klein; [id number]; Nov 16, 1930   Admit date: 03/25/2018 Date of Consult: 03/26/2018  Primary Care Provider: Lajean Manes, MD Primary Cardiologist: No primary care provider on file. Primary Electrophysiologist:  Laurie Klein  Chief Complaint: fall  Patient Profile:   Laurie Klein is a 84 y.o. female with a hx of anxiety, dementia, hypothyroidism, and syncope s/p ILR implant who presents after a fall.  History of Present Illness:   Patient is not totally sure what happened yesterday but states that she fell.  She was walking at the time and does not remember what happened.  She is not sure if she passed out.  She thinks she might have been lightheaded.  She fell onto her left side causing left hip pain.  Patient was brought to the emergency room.  Plain films negative for hip fracture.  CT had unremarkable.  Patient saw Dr. Lovena Klein on 9/20 for follow-up of her ILR and history of syncope.  ILR interrogation showed an episode of bradycardia with heart rates in the 30 range which correlated with a fall.  Pacemaker implantation was planned as an outpatient.   Past Medical History:  Diagnosis Date  . Anxiety   . Chronic headaches    history of  . DDD (degenerative disc disease) 05/24/2013  . Degenerative disc disease   . Dyslipidemia   . Headache(784.0): chronic 05/24/2013  . Heart palpitations   . Hypothyroidism   . Mitral valve prolapse   . MVP (mitral valve prolapse) 05/24/2013  . PAC (premature atrial contraction)   . Spinal stenosis   . TMJ syndrome     Past Surgical History:  Procedure Laterality Date  . APPENDECTOMY    . BREAST BIOPSY  3 times  . BREAST EXCISIONAL BIOPSY Right 1970   benign  . BREAST EXCISIONAL BIOPSY Left 1950   2 benign  . BREAST EXCISIONAL BIOPSY Right 1950   benign  . CHOLECYSTECTOMY    . disectomy     micro lumbar  . LOOP RECORDER INSERTION N/A 01/17/2018   Procedure: LOOP RECORDER INSERTION;   Surgeon: Laurie Lance, MD;  Location: [facility];  Service: Cardiovascular;  Laterality: N/A;  . TONSILLECTOMY       Inpatient Medications: Scheduled Meds: . donepezil  10 mg Oral QHS  . levothyroxine  75 mcg Oral QAC breakfast  . simvastatin  20 mg Oral QPM   Continuous Infusions: . sodium chloride 75 mL/hr at 03/26/18 0052   PRN Meds: acetaminophen **OR** acetaminophen, HYDROcodone-acetaminophen, ondansetron **OR** ondansetron (ZOFRAN) IV, polyethylene glycol  Home Meds: Prior to Admission medications   Medication Sig Start Date End Date Taking? Authorizing Provider  Calcium Citrate (CITRACAL PO) Take 1 tablet by mouth daily.    Yes [provider]  cholecalciferol (VITAMIN D) 1000 UNITS tablet Take 1,000 Units by mouth daily.    Yes [provider]  co-enzyme Q-10 30 MG capsule Take 100 mg by mouth daily.    Yes [provider]  donepezil (ARICEPT) 10 MG tablet Take 10 mg by mouth at bedtime.   Yes [provider]  fish oil-omega-3 fatty acids 1000 MG capsule Take 2 g by mouth daily.    Yes [provider]  Multiple Vitamin (MULTIVITAMIN) capsule Take 1 capsule by mouth daily.   Yes [provider]  simvastatin (ZOCOR) 20 MG tablet Take 20 mg by mouth every evening.   Yes [provider]  SYNTHROID 75 MCG tablet Take  75 mcg by mouth daily. 02/04/17  Yes [provider]  tetrahydrozoline (VISINE) 0.05 % ophthalmic solution Place 3 drops into both eyes daily as needed (For dry eyes).    Yes [provider]    Allergies:    Allergies  Allergen Reactions  . Acrylic Polymer Other (See Comments)    Blisters inside mouth   . Lipitor [Atorvastatin Calcium] Other (See Comments)    Myalgias and muscle aches   . Orudis Kt Other (See Comments)    Unknown   . Sulfa Antibiotics Itching  . Amoxicillin Itching and Rash    Has patient had a PCN reaction causing immediate rash, facial/tongue/throat  swelling, SOB or lightheadedness with hypotension: Yes Has patient had a PCN reaction causing severe rash involving mucus membranes or skin necrosis: No Has patient had a PCN reaction that required hospitalization: No Has patient had a PCN reaction occurring within the last 10 years: No If all of the above answers are "NO", then may proceed with Cephalosporin use.     Social History:   Social History   Socioeconomic History  . Marital status: Married    Spouse name: Not on file  . Number of children: Not on file  . Years of education: Not on file  . Highest education level: Not on file  Occupational History  . Occupation: retired Transport planner  . Financial resource strain: Not on file  . Food insecurity:    Worry: Not on file    Inability: Not on file  . Transportation needs:    Medical: Not on file    Non-medical: Not on file  Tobacco Use  . Smoking status: Former Smoker    Packs/day: 0.50    Types: Cigarettes    Last attempt to quit: 10/03/1973    Years since quitting: 44.5  . Smokeless tobacco: Never Used  Substance and Sexual Activity  . Alcohol use: Yes    Alcohol/week: 3.0 standard drinks    Types: 3 drink(s) per week    Comment: 4 glasses wine per week  . Drug use: No  . Sexual activity: Not on file  Lifestyle  . Physical activity:    Days per week: Not on file    Minutes per session: Not on file  . Stress: Not on file  Relationships  . Social connections:    Talks on phone: Not on file    Gets together: Not on file    Attends religious service: Not on file    Active member of club or organization: Not on file    Attends meetings of clubs or organizations: Not on file    Relationship status: Not on file  . Intimate partner violence:    Fear of current or ex partner: Not on file    Emotionally abused: Not on file    Physically abused: Not on file    Forced sexual activity: Not on file  Other Topics Concern  . Not on file  Social History  Narrative  . Not on file    Family History:   The patient's family history includes Heart disease in her father; Heart failure (age of onset: 68) in her mother; Melanoma in her child; Stroke in her mother.  ROS:  Please see the history of present illness.  All other ROS reviewed and negative.     Physical Exam/Data:   Vitals:   03/25/18 2300 03/25/18 2330 03/26/18 0019 03/26/18 0335  BP: (!) 155/66 (!) 153/63 134/61 Marland Kitchen)  121/38  Pulse: 61 (!) 57 (!) 58 (!) 57  Resp: 16 11 15 13   Temp:   98.2 F (36.8 C) 98.9 F (37.2 C)  TempSrc:   Oral Oral  SpO2: 94% 99% 100% 99%  Weight:   57.1 kg   Height:   5\' 4"  (1.626 m)     Intake/Output Summary (Last 24 hours) at 03/26/2018 0510 Last data filed at 03/26/2018 0400 Gross per 24 hour  Intake 424.01 ml  Output -  Net 424.01 ml   Filed Weights   03/26/18 0019  Weight: 57.1 kg   Body mass index is 21.61 kg/m.  General: Well developed, well nourished, in no acute distress. Head: Normocephalic, atraumatic, sclera non-icteric, no xanthomas, nares are without discharge.  Neck: Negative for carotid bruits. JVD not elevated. Lungs: Clear bilaterally to auscultation without wheezes, rales, or rhonchi. Breathing is unlabored. Heart: RRR with S1 S2. No murmurs, rubs, or gallops appreciated. Abdomen: Soft, non-tender, non-distended with normoactive bowel sounds. No hepatomegaly. No rebound/guarding. No obvious abdominal masses. Msk:  Strength and tone appear normal for age. Extremities: No clubbing or cyanosis. No edema.  Distal pedal pulses are 2+ and equal bilaterally. Neuro: Alert and oriented X 3. No facial asymmetry. No focal deficit. Moves all extremities spontaneously. Psych:  Responds to questions appropriately with a normal affect.  EKG:  The EKG was personally reviewed and demonstrates normal sinus rhythm with normal intervals  Relevant CV Studies: Echocardiogram is pending  Laboratory Data:  Chemistry Recent Labs  Lab  03/20/18 1025 03/24/18 1155 03/25/18 2024 03/26/18 0327  NA 141 138 142 138  K 3.9 4.6 3.1* 4.9  CL 107 98 105 104  CO2 22 24 27 25   GLUCOSE 98 86 93 100*  BUN 11 12 18 14   CREATININE 0.73 0.69 0.72 0.76  CALCIUM 8.7* 10.1 9.5 8.9  GFRNONAA >60 80 >60 >60  GFRAA >60 92 >60 >60  ANIONGAP 12  --  10 9    Recent Labs  Lab 03/25/18 2024 03/26/18 0327  PROT 6.7 5.8*  ALBUMIN 3.8 3.2*  AST 25 136*  ALT 18 76*  ALKPHOS 67 77  BILITOT 0.6 0.6   Hematology Recent Labs  Lab 03/24/18 1155 03/25/18 2024 03/26/18 0327  WBC 9.4 12.9* 12.5*  RBC 4.37 4.52 4.22  HGB 13.3 13.8 12.9  HCT 39.9 41.0 39.3  MCV 91 90.7 93.1  MCH 30.4 30.5 30.6  MCHC 33.3 33.7 32.8  RDW 13.7 14.8 14.6  PLT 333 328 277   Cardiac Enzymes Recent Labs  Lab 03/25/18 2256  TROPONINI <0.03    Recent Labs  Lab 03/20/18 0901 03/25/18 2101  TROPIPOC 0.00 0.00    BNPNo results for input(s): BNP, PROBNP in the last 168 hours.  DDimer No results for input(s): DDIMER in the last 168 hours.  Radiology/Studies:  Dg Ribs Unilateral W/chest Right  Result Date: 03/25/2018 CLINICAL DATA:  Fall, rib pain on the right EXAM: RIGHT RIBS AND CHEST - 3+ VIEW COMPARISON:  03/20/2018 FINDINGS: Loop recorder device projects over the left chest. Heart is mildly enlarged. No confluent airspace opacities or effusions. No visible rib fracture. No pneumothorax. IMPRESSION: Cardiomegaly.  No active disease. No visible rib fracture. Electronically Signed   By: Rolm Baptise M.D.   On: 03/25/2018 18:57   Dg Lumbar Spine Complete  Result Date: 03/25/2018 CLINICAL DATA:  Fall. EXAM: LUMBAR SPINE - COMPLETE 4+ VIEW COMPARISON:  Abdomen and pelvis CT 09/24/2017 FINDINGS: L2 compression fracture present since  prior CT of 09/24/2017 although there may be some or interval loss of vertebral body height. Sclerotic focus in the L1 vertebral body is stable. Degenerative disc disease at L3-4, L4-5, and L5-S1 is similar to prior. SI joints  are unremarkable. Bones are diffusely demineralized. IMPRESSION: L2 compression fracture with probable loss of height since 09/24/2017. Degenerative disc disease in the lower lumbar spine without evidence of new fracture. Electronically Signed   By: Misty Stanley M.D.   On: 03/25/2018 18:59   Dg Tibia/fibula Left  Result Date: 03/25/2018 CLINICAL DATA:  Fall EXAM: LEFT TIBIA AND FIBULA - 2 VIEW COMPARISON:  None. FINDINGS: There is no evidence of fracture or other focal bone lesions. Soft tissues are unremarkable. IMPRESSION: Negative. Electronically Signed   By: Rolm Baptise M.D.   On: 03/25/2018 20:43   Dg Foot Complete Left  Result Date: 03/25/2018 CLINICAL DATA:  Fall, foot bruising, pain EXAM: LEFT FOOT - COMPLETE 3+ VIEW COMPARISON:  None. FINDINGS: No acute bony abnormality. Specifically, no fracture, subluxation, or dislocation. Joint spaces maintained. Soft tissues intact. IMPRESSION: No acute bony abnormality. Electronically Signed   By: Rolm Baptise M.D.   On: 03/25/2018 20:43   Dg Hip Unilat W Or Wo Pelvis 2-3 Views Right  Result Date: 03/25/2018 CLINICAL DATA:  Right hip pain following a fall today. EXAM: DG HIP (WITH OR WITHOUT PELVIS) 2-3V RIGHT COMPARISON:  None. FINDINGS: Normal appearing right hip without fracture or dislocation. Lower lumbar spine degenerative changes. IMPRESSION: No fracture or dislocation. Lower lumbar spine degenerative changes. Electronically Signed   By: Claudie Revering M.D.   On: 03/25/2018 18:58    Assessment and Plan:   1. Presyncope 2. History of sinus bradycardia Patient presents after a fall yesterday with possible presyncope or syncope (unclear by history).  She has a history of falls correlating to sinus bradycardia down to 30.  We will plan to interrogate ILR today to see if this most recent episode correlates with arrhythmia.  As she is already plan to get a pacemaker as an outpatient, will discuss possibly implanting the pacemaker while she is here.   Agree with getting echocardiogram. - ILR interrogation today - possible permanent pacemaker implantation this admission - Echocardiogram  Cardiology/EP to follow-up today with further recommendations    For questions or updates, please contact [facility] Please consult [email or web address] for contact info under Cardiology/STEMI.    Liliane Bade, MD  03/26/2018 5:10 AM

## 2018-03-27 ENCOUNTER — Inpatient Hospital Stay (HOSPITAL_COMMUNITY)
Admission: EM | Disposition: A | Discharge status: Skilled Nursing Facility | Payer: Self-pay | Source: Home / Self Care | Attending: Internal Medicine

## 2018-03-27 ENCOUNTER — Inpatient Hospital Stay (HOSPITAL_COMMUNITY): Payer: Medicare Other

## 2018-03-27 ENCOUNTER — Encounter (HOSPITAL_COMMUNITY): Payer: Self-pay | Admitting: Internal Medicine

## 2018-03-27 ENCOUNTER — Other Ambulatory Visit: Payer: Self-pay | Admitting: Internal Medicine

## 2018-03-27 ENCOUNTER — Other Ambulatory Visit (HOSPITAL_COMMUNITY): Payer: Medicare Other

## 2018-03-27 DIAGNOSIS — I495 Sick sinus syndrome: Secondary | ICD-10-CM

## 2018-03-27 DIAGNOSIS — D72829 Elevated white blood cell count, unspecified: Secondary | ICD-10-CM

## 2018-03-27 DIAGNOSIS — Z950 Presence of cardiac pacemaker: Secondary | ICD-10-CM

## 2018-03-27 DIAGNOSIS — I340 Nonrheumatic mitral (valve) insufficiency: Secondary | ICD-10-CM

## 2018-03-27 HISTORY — PX: LOOP RECORDER REMOVAL: EP1215

## 2018-03-27 HISTORY — PX: PACEMAKER IMPLANT: EP1218

## 2018-03-27 HISTORY — DX: Presence of cardiac pacemaker: Z95.0

## 2018-03-27 LAB — SURGICAL PCR SCREEN
MRSA, PCR: NEGATIVE
STAPHYLOCOCCUS AUREUS: NEGATIVE

## 2018-03-27 LAB — ECHOCARDIOGRAM COMPLETE
Height: 64 in
WEIGHTICAEL: 2014.12 [oz_av]

## 2018-03-27 SURGERY — PACEMAKER IMPLANT

## 2018-03-27 MED ORDER — CHLORHEXIDINE GLUCONATE 4 % EX LIQD: 60.0000 mL | Freq: Once | CUTANEOUS | Status: AC

## 2018-03-27 MED ORDER — VANCOMYCIN HCL IN DEXTROSE 1-5 GM/200ML-% IV SOLN
INTRAVENOUS | Status: AC
  Filled 2018-03-27: qty 200

## 2018-03-27 MED ORDER — VANCOMYCIN HCL IN DEXTROSE 1-5 GM/200ML-% IV SOLN
1000.0000 mg | INTRAVENOUS | Status: AC
  Administered 2018-03-27: 1000 mg via INTRAVENOUS
  Filled 2018-03-27: qty 200

## 2018-03-27 MED ORDER — ACETAMINOPHEN 325 MG PO TABS
325.0000 mg | ORAL_TABLET | ORAL | Status: DC | PRN
  Administered 2018-03-28 – 2018-03-29 (×2): 650 mg via ORAL
  Filled 2018-03-27 (×2): qty 2

## 2018-03-27 MED ORDER — LIDOCAINE HCL (PF) 1 % IJ SOLN
INTRAMUSCULAR | Status: DC | PRN
  Administered 2018-03-27: 60 mL

## 2018-03-27 MED ORDER — MIDAZOLAM HCL 5 MG/5ML IJ SOLN
INTRAMUSCULAR | Status: AC
  Filled 2018-03-27: qty 5

## 2018-03-27 MED ORDER — FENTANYL CITRATE (PF) 100 MCG/2ML IJ SOLN
INTRAMUSCULAR | Status: AC
  Filled 2018-03-27: qty 2

## 2018-03-27 MED ORDER — LIDOCAINE HCL 1 % IJ SOLN
INTRAMUSCULAR | Status: AC
  Filled 2018-03-27: qty 60

## 2018-03-27 MED ORDER — SODIUM CHLORIDE 0.9 % IV SOLN
80.0000 mg | INTRAVENOUS | Status: AC
  Administered 2018-03-27: 80 mg
  Filled 2018-03-27: qty 2

## 2018-03-27 MED ORDER — HEPARIN (PORCINE) IN NACL 1000-0.9 UT/500ML-% IV SOLN
INTRAVENOUS | Status: DC | PRN
  Administered 2018-03-27: 500 mL

## 2018-03-27 MED ORDER — HEPARIN (PORCINE) IN NACL 1000-0.9 UT/500ML-% IV SOLN
INTRAVENOUS | Status: AC
  Filled 2018-03-27: qty 500

## 2018-03-27 MED ORDER — SODIUM CHLORIDE 0.9 % IV SOLN
INTRAVENOUS | Status: DC
  Administered 2018-03-27: 10:00:00 via INTRAVENOUS

## 2018-03-27 MED ORDER — SODIUM CHLORIDE 0.9 % IV SOLN: 250.0000 mL | INTRAVENOUS | Status: DC

## 2018-03-27 MED ORDER — VANCOMYCIN HCL IN DEXTROSE 1-5 GM/200ML-% IV SOLN
1000.0000 mg | Freq: Two times a day (BID) | INTRAVENOUS | Status: AC
  Administered 2018-03-27: 1000 mg via INTRAVENOUS
  Filled 2018-03-27: qty 200

## 2018-03-27 MED ORDER — SODIUM CHLORIDE 0.9% FLUSH: 3.0000 mL | Freq: Two times a day (BID) | INTRAVENOUS | Status: DC

## 2018-03-27 MED ORDER — CHLORHEXIDINE GLUCONATE 4 % EX LIQD
60.0000 mL | Freq: Once | CUTANEOUS | Status: DC
  Administered 2018-03-27: 4 via TOPICAL

## 2018-03-27 MED ORDER — MUPIROCIN 2 % EX OINT
TOPICAL_OINTMENT | CUTANEOUS | Status: AC
  Filled 2018-03-27: qty 22

## 2018-03-27 MED ORDER — SODIUM CHLORIDE 0.9 % IV SOLN
INTRAVENOUS | Status: AC
  Filled 2018-03-27: qty 2

## 2018-03-27 MED ORDER — ONDANSETRON HCL 4 MG/2ML IJ SOLN: 4.0000 mg | Freq: Four times a day (QID) | INTRAMUSCULAR | Status: DC | PRN

## 2018-03-27 MED ORDER — FENTANYL CITRATE (PF) 100 MCG/2ML IJ SOLN
INTRAMUSCULAR | Status: DC | PRN
  Administered 2018-03-27: 12.5 ug via INTRAVENOUS

## 2018-03-27 MED ORDER — SODIUM CHLORIDE 0.9% FLUSH: 3.0000 mL | INTRAVENOUS | Status: DC | PRN

## 2018-03-27 SURGICAL SUPPLY — 7 items
CABLE SURGICAL S-101-97-12 (CABLE) ×3 IMPLANT
LEAD SOLIA S PRO MRI 45 (Lead) ×3 IMPLANT
LEAD SOLIA S PRO MRI 53 (Lead) ×3 IMPLANT
PACEMAKER EDORA 8DR-T MRI (Pacemaker) ×3 IMPLANT
PAD PRO RADIOLUCENT 2001M-C (PAD) ×3 IMPLANT
SHEATH CLASSIC 7F (SHEATH) ×6 IMPLANT
TRAY PACEMAKER INSERTION (PACKS) ×3 IMPLANT

## 2018-03-27 NOTE — Interval H&P Note (Signed)
History and Physical Interval Note:  03/27/2018 11:56 AM  Laurie Klein  has presented today for surgery, with the diagnosis of bradicardia  The various methods of treatment have been discussed with the patient and family. After consideration of risks, benefits and other options for treatment, the patient has consented to  Procedure(s): PACEMAKER IMPLANT (N/A) as a surgical intervention .  The patient's history has been reviewed, patient examined, no change in status, stable for surgery.  I have reviewed the patient's chart and labs.  Questions were answered to the patient's satisfaction.     Cristopher Peru

## 2018-03-27 NOTE — Progress Notes (Signed)
  Pt was out of room to procedure and will try again with her as time and pt allow.    03/27/18 1200  PT Visit Information  Last PT Received On 03/27/18  Reason Eval/Treat Not Completed Patient at procedure or test/unavailable    Laurie Klein, PT MS Acute Rehab Dept. Number: [place] [facility] [place]

## 2018-03-27 NOTE — NC FL2 (Addendum)
[place] LEVEL OF CARE SCREENING TOOL     IDENTIFICATION  Patient Name: Laurie Klein Birthdate: 23-May-1931 Sex: female Admission Date (Current Location): 03/25/2018  [facility] and [place] Number:  Herbalist and Address:  [facility], [place] [address], [place], [place] [address]      Provider Number: [id number]  Attending Physician Name and Address:  Desiree Hane, MD  Relative Name and Phone Number:  Rogene Meth, son, [phone]    Current Level of Care: Hospital Recommended Level of Care: [facility] Prior Approval Number:    Date Approved/Denied:   PASRR Number: [id number]  Discharge Plan: SNF    Current Diagnoses: Patient Active Problem List   Diagnosis Date Noted  . Leukocytosis 03/27/2018  . Symptomatic bradycardia 03/25/2018  . Dementia 03/25/2018  . Contusion of right periocular region   . Great toe pain, left   . Syncope 04/19/2017  . Syncope and collapse 05/24/2013  . MVP (mitral valve prolapse) 05/24/2013  . Anxiety 05/24/2013  . Dyslipidemia 05/24/2013  . Hypothyroidism 05/24/2013  . Headache(784.0): chronic 05/24/2013  . Spinal stenosis 05/24/2013  . PAC (premature atrial contraction) 05/24/2013  . DDD (degenerative disc disease) 05/24/2013    Orientation RESPIRATION BLADDER Height & Weight     Self, Time, Situation, Place  Normal Continent, External catheter Weight: 125 lb 14.1 oz (57.1 kg) Height:  5\' 4"  (162.6 cm)  BEHAVIORAL SYMPTOMS/MOOD NEUROLOGICAL BOWEL NUTRITION STATUS      Continent Diet(please see DC summary)  AMBULATORY STATUS COMMUNICATION OF NEEDS Skin   Extensive Assist Verbally Normal                       Personal Care Assistance Level of Assistance  Bathing, Feeding, Dressing Bathing Assistance: Maximum assistance Feeding assistance: Independent Dressing Assistance: Maximum assistance     Functional Limitations Info  Sight, Hearing, Speech Sight Info:  Impaired Hearing Info: Adequate Speech Info: Adequate    SPECIAL CARE FACTORS FREQUENCY  PT (By licensed PT), OT (By licensed OT)     PT Frequency: 5x/week OT Frequency: 5x/week            Contractures Contractures Info: Not present    Additional Factors Info  Code Status, Allergies Code Status Info: DNR Allergies Info: Acrylic Polymer, Lipitor Atorvastatin Calcium, Orudis Kt, Sulfa Antibiotics, Amoxicillin           Current Medications (03/27/2018):   TAKE these medications   acetaminophen 325 MG tablet Commonly known as:  TYLENOL Take 2 tablets (650 mg total) by mouth every 4 (four) hours as needed for mild pain or fever.   cholecalciferol 1000 units tablet Commonly known as:  VITAMIN D Take 1,000 Units by mouth daily.   CITRACAL PO Take 1 tablet by mouth daily.   co-enzyme Q-10 30 MG capsule Take 100 mg by mouth daily.   donepezil 10 MG tablet Commonly known as:  ARICEPT Take 10 mg by mouth at bedtime.   fish oil-omega-3 fatty acids 1000 MG capsule Take 2 g by mouth daily.   HYDROcodone-acetaminophen 5-325 MG tablet Commonly known as:  NORCO/VICODIN Take 1 tablet by mouth every 4 (four) hours as needed for moderate pain.   multivitamin capsule Take 1 capsule by mouth daily.   polyethylene glycol packet Commonly known as:  MIRALAX / GLYCOLAX Take 17 g by mouth daily as needed for mild constipation.   simvastatin 20 MG tablet Commonly known as:  ZOCOR  Take 20 mg by mouth every evening.   SYNTHROID 75 MCG tablet Generic drug:  levothyroxine Take 75 mcg by mouth daily.   VISINE 0.05 % ophthalmic solution Generic drug:  tetrahydrozoline Place 3 drops into both eyes daily as needed (For dry eyes).       Discharge Medications: Please see discharge summary for a list of discharge medications.  Relevant Imaging Results:  Relevant Lab Results:   Additional Information SSN: [id number]  Estanislado Emms, LCSW

## 2018-03-27 NOTE — Clinical Social Work Note (Signed)
Clinical Social Work Assessment  Patient Details  Name: Laurie Klein MRN: [id number] Date of Birth: [birth year]  Date of referral:  03/27/18               Reason for consult:  Facility Placement, Discharge Planning                Permission sought to share information with:  Family Supports Permission granted to share information::  Yes, Verbal Permission Granted  Name::     Herbie Baltimore Jarnigan  Agency::  [facility]  Relationship::  son  Contact Information:  [phone]  Housing/Transportation Living arrangements for the past 2 months:  [place] of Information:  Patient Patient Interpreter Needed:  None Criminal Activity/Legal Involvement Pertinent to Current Situation/Hospitalization:  No - Comment as needed Significant Relationships:  Adult Children Lives with:  Self, Facility Resident([facility]) Do you feel safe going back to the place where you live?  Yes Need for family participation in patient care:  Yes (Comment)  Care giving concerns:  No family at bedside. Patient stated she is from [facility] and has been at facility for 8 years.    Social Worker assessment / plan:  CSW met patient at bedside to discuss discharge plan. Patient stated she is still unsure about discharging to a SNF and would like to think about it before making a final decision. Patient stated she will let CSW know her decision   Employment status:  Retired Forensic scientist:  Medicare PT Recommendations:  [place] / Referral to community resources:  [place]  Patient/Family's Response to care:  Patient stated she appreciates CSW role in care  Patient/Family's Understanding of and Emotional Response to Diagnosis, Current Treatment, and Prognosis:  CSW will continue to follow patient for support and discharge need   Emotional Assessment Appearance:  Appears stated age Attitude/Demeanor/Rapport:  Engaged Affect (typically observed):   Accepting Orientation:  Oriented to  Time, Oriented to Situation, Oriented to Place, Oriented to Self Alcohol / Substance use:  Not Applicable Psych involvement (Current and /or in the community):  No (Comment)  Discharge Needs  Concerns to be addressed:  Care Coordination Readmission within the last 30 days:  No Current discharge risk:  Dependent with Mobility Barriers to Discharge:  Continued Medical Work up   [employer], LCSW 03/27/2018, 3:04 PM

## 2018-03-27 NOTE — H&P (View-Only) (Signed)
Progress Note  Patient Name: Laurie Klein Date of Encounter: 03/27/2018  Primary Cardiologist: Charlsie Quest Electrophysiologist: Dr. Lovena Le  Subjective   No CP or SOB, L ankle is tender  Inpatient Medications    Scheduled Meds: . donepezil  10 mg Oral QHS  . levothyroxine  75 mcg Oral QAC breakfast  . simvastatin  20 mg Oral QPM   Continuous Infusions:  PRN Meds: acetaminophen **OR** acetaminophen, HYDROcodone-acetaminophen, ondansetron **OR** ondansetron (ZOFRAN) IV, polyethylene glycol   Vital Signs    Vitals:   03/26/18 0335 03/26/18 1313 03/26/18 2110 03/27/18 0412  BP: (!) 121/38   (!) 120/52  Pulse: (!) 57 63  70  Resp: 13   (!) 21  Temp: 98.9 F (37.2 C) 99.2 F (37.3 C) 98.8 F (37.1 C) 98.7 F (37.1 C)  TempSrc: Oral Oral  Oral  SpO2: 99% 100%  94%  Weight:      Height:        Intake/Output Summary (Last 24 hours) at 03/27/2018 0746 Last data filed at 03/27/2018 0421 Gross per 24 hour  Intake 720 ml  Output 2600 ml  Net -1880 ml   Filed Weights   03/26/18 0019  Weight: 57.1 kg    Telemetry    SB/SR, generally 60's - Personally Reviewed  ECG    No new EKGs - Personally Reviewed  Physical Exam   GEN: No acute distress.   Neck: No JVD Cardiac: RRR, no murmurs, rubs, or gallops.  Respiratory: Clear to auscultation bilaterally. GI: Soft, nontender, non-distended  MS: No edema; No deformity.L ankle is tender, some ecchymosis, mild swelling (neg xray) Neuro:  Nonfocal.  She is AAO to self, time, place Psych: Normal affect   Labs    Chemistry Recent Labs  Lab 03/20/18 1025 03/24/18 1155 03/25/18 2024 03/26/18 0327  NA 141 138 142 138  K 3.9 4.6 3.1* 4.9  CL 107 98 105 104  CO2 22 24 27 25   GLUCOSE 98 86 93 100*  BUN 11 12 18 14   CREATININE 0.73 0.69 0.72 0.76  CALCIUM 8.7* 10.1 9.5 8.9  PROT  --   --  6.7 5.8*  ALBUMIN  --   --  3.8 3.2*  AST  --   --  25 136*  ALT  --   --  18 76*  ALKPHOS  --   --  67 77  BILITOT  --   --   0.6 0.6  GFRNONAA >60 80 >60 >60  GFRAA >60 92 >60 >60  ANIONGAP 12  --  10 9     Hematology Recent Labs  Lab 03/24/18 1155 03/25/18 2024 03/26/18 0327  WBC 9.4 12.9* 12.5*  RBC 4.37 4.52 4.22  HGB 13.3 13.8 12.9  HCT 39.9 41.0 39.3  MCV 91 90.7 93.1  MCH 30.4 30.5 30.6  MCHC 33.3 33.7 32.8  RDW 13.7 14.8 14.6  PLT 333 328 277    Cardiac Enzymes Recent Labs  Lab 03/25/18 2256  TROPONINI <0.03    Recent Labs  Lab 03/20/18 0901 03/25/18 2101  TROPIPOC 0.00 0.00     BNPNo results for input(s): BNP, PROBNP in the last 168 hours.   DDimer No results for input(s): DDIMER in the last 168 hours.   Radiology    Dg Ribs Unilateral W/chest Right Result Date: 03/25/2018 CLINICAL DATA:  Fall, rib pain on the right EXAM: RIGHT RIBS AND CHEST - 3+ VIEW COMPARISON:  03/20/2018 FINDINGS: Loop recorder device projects over the  left chest. Heart is mildly enlarged. No confluent airspace opacities or effusions. No visible rib fracture. No pneumothorax. IMPRESSION: Cardiomegaly.  No active disease. No visible rib fracture. Electronically Signed   By: Rolm Baptise M.D.   On: 03/25/2018 18:57    Dg Lumbar Spine Complete Result Date: 03/25/2018 CLINICAL DATA:  Fall. EXAM: LUMBAR SPINE - COMPLETE 4+ VIEW COMPARISON:  Abdomen and pelvis CT 09/24/2017 FINDINGS: L2 compression fracture present since prior CT of 09/24/2017 although there may be some or interval loss of vertebral body height. Sclerotic focus in the L1 vertebral body is stable. Degenerative disc disease at L3-4, L4-5, and L5-S1 is similar to prior. SI joints are unremarkable. Bones are diffusely demineralized. IMPRESSION: L2 compression fracture with probable loss of height since 09/24/2017. Degenerative disc disease in the lower lumbar spine without evidence of new fracture. Electronically Signed   By: Misty Stanley M.D.   On: 03/25/2018 18:59    Dg Tibia/fibula Left Result Date: 03/25/2018 CLINICAL DATA:  Fall EXAM: LEFT TIBIA  AND FIBULA - 2 VIEW COMPARISON:  None. FINDINGS: There is no evidence of fracture or other focal bone lesions. Soft tissues are unremarkable. IMPRESSION: Negative. Electronically Signed   By: Rolm Baptise M.D.   On: 03/25/2018 20:43    Dg Foot Complete Left Result Date: 03/25/2018 CLINICAL DATA:  Fall, foot bruising, pain EXAM: LEFT FOOT - COMPLETE 3+ VIEW COMPARISON:  None. FINDINGS: No acute bony abnormality. Specifically, no fracture, subluxation, or dislocation. Joint spaces maintained. Soft tissues intact. IMPRESSION: No acute bony abnormality. Electronically Signed   By: Rolm Baptise M.D.   On: 03/25/2018 20:43    Dg Hip Unilat W Or Wo Pelvis 2-3 Views Right Result Date: 03/25/2018 CLINICAL DATA:  Right hip pain following a fall today. EXAM: DG HIP (WITH OR WITHOUT PELVIS) 2-3V RIGHT COMPARISON:  None. FINDINGS: Normal appearing right hip without fracture or dislocation. Lower lumbar spine degenerative changes. IMPRESSION: No fracture or dislocation. Lower lumbar spine degenerative changes. Electronically Signed   By: Claudie Revering M.D.   On: 03/25/2018 18:58    Cardiac Studies   TTE 04/20/17 - Left ventricle: The cavity size was normal. Wall thickness was normal. Systolic function was normal. The estimated ejection fraction was in the range of 60% to 65%. Wall motion was normal; there were no regional wall motion abnormalities. Doppler parameters are consistent with abnormal left ventricular relaxation (grade 1 diastolic dysfunction). - Aortic valve: There was trivial regurgitation   Patient Profile     84 y.o. female with dementia, anxiety, hypothyroidism, DDD and h/o syncope w/ILR, noted to have bradycardia outpatient in the 30's with an episode of syncope, was planned for PPM implant and ILR removal admitted to [facility] for a fall, perhaps episode of profound weakness, not clearly syncope.  Assessment & Plan    1. Fall, vs episode of weakness 2. Known hx of syncope  associated with bradycardia     She saw Dr. Lovena Le earlier this month with plans for PPM     Unclear exactly what this episode was  The patient recalls discussing PPM implant with Dr. Lovena Le, though not the specifics Unclear what happened this event, symptoms or otherwise. She tells me she stood to go do something and her legs just wouldn't work, she thinks they gave out, she is not certain if she felt lightheaded of near faint, she does not think she fainted this time, but reports she has in the past.  She carries a hx of dementia,  she is forgetful, and is quite unsure of what happened.  She is able to tell me she who she is, the year, and that she is here because she had "an episode".  She could not tell me the name of the president, but described his appearance to me appropriately.    ILR interrogation,noted brady episodes 03/25/18 @14 :40 with rates 28-30bpm  Seems reasonable to proceed with pacing while here.  I discussed the procedure, possible risks/benefits with the patient, she reported understanding, would like to proceed.  She reports signing her own consents.  3. L ankle pain     Xray neg for fracture     C/w medicine team  4. Baseline dementia     C/w medicine team  5. Leukocytosis     Afebrile, pt denies symptoms of illness     Abn UA     Deferred to medicine team     For questions or updates, please contact [place] Please consult [email or web address] for contact info under        Signed, Baldwin Jamaica, PA-C  03/27/2018, 7:46 AM    EP Attending  Patient seen and examined. Agree with above. She has had recurrent symptomatic bradycardia and we will undergo PPM. I have previously discussed the indications/risks/benefits/goals/expectations and she is willing to proceed.  Mikle Bosworth.D.

## 2018-03-27 NOTE — Progress Notes (Signed)
Pt has left the unit by bed to EP lab for PPM implant. Report given to staff at EP lab. Vitals remains stable.  Kennyth Lose, RN

## 2018-03-27 NOTE — Progress Notes (Signed)
PROGRESS NOTE  Laurie Klein GMW:[id number] DOB: Nov 08, 1930 DOA: 03/25/2018 PCP: Lajean Manes, MD  HPI/Brief Narrative  Laurie Klein is a 84 y.o. year old female with medical history significant for anxiety, dementia, hypothyroidism, and syncope s/p ILR implant  who presented on 03/25/2018 with fall and was found to have symptomatic bradycardia.  Work-up to date troponin trend has been negative, Infection work-up unremarkable.  Since presenting after a fall x-ray in the ED of left tibia, left foot, ribs, lumbar spine, hip were negative for any acute fracture  She is followed by Dr. Lovena Le (cardiology) as outpatient.  Last seen on 9/20 with plan to undergo outpatient permanent pacemaker before this admission given concern for sinus node dysfunction based off arrhythmia findings.  Subjective Still having left ankle pain.   Assessment/Plan:  #Unwitnessed syncope and fall secondary to symptomatic sinus bradycardia.  Based off interrogation of patient's implantable loop recorder by cardiology it seems her fall is related to symptomatic bradycardia.. Still bradycardic on telemetry, Cardiology plans for permanent pacemaker implantation , pending TTE per cardiology  #Unwitnessed fall, related to above problem. No fractures on xrays ( left tibia, left foot, ribs, lumbar spine, hip).  Pain control Prn norco 1-2 tabs q4Hrprn moderate pain  #Left ankle pain, due to above. Mild bruising on lateral ankle, painful with movement. Still good mobility and pulses. XR negative for fracture. Pain control as stated above.   #Leukocytosis. Suspect stress leukocytosis related to fall. No localizing signs or symptoms, remained afebrile. Ua with moderate leukocytes but rare bacteria and negative nitrite not concerning for infection with no dysuria. Repeat CBC  #Dementia.  Alert and oriented.  Mental status baseline.  Delirium precautions. PTA aricept  #Hypokalemia, resolved.  Status post oral repletion on  admission  #Hyperlipidemia, stable.  Continue Zocor  #Hypothyroidism, stable.  TSH at 6, appropriate for age.  Continue Synthroid    Code Status: DNR   Family Communication: no family at bedside   Disposition Plan: possible PPM implantation awaiting cardiology recs    Consultants:  Treatment Team:   Lbcardiology, Rounding, MD    Procedures:  TTE pending   Antimicrobials: Anti-infectives (From admission, onward)   Start     Dose/Rate Route Frequency Ordered Stop   03/27/18 1210  gentamicin (GARAMYCIN) 80 mg in sodium chloride 0.9 % 500 mL irrigation     80 mg Irrigation To [facility] 03/27/18 0910 03/28/18 1215   03/27/18 1210  vancomycin (VANCOCIN) IVPB 1000 mg/200 mL premix     1,000 mg 200 mL/hr over 60 Minutes Intravenous To [facility] 03/27/18 0910 03/28/18 1215        Cultures:  none  Telemetry:yes  DVT prophylaxis: SCDs   Objective: Vitals:   03/26/18 0335 03/26/18 1313 03/26/18 2110 03/27/18 0412  BP: (!) 121/38   (!) 120/52  Pulse: (!) 57 63  70  Resp: 13   (!) 21  Temp: 98.9 F (37.2 C) 99.2 F (37.3 C) 98.8 F (37.1 C) 98.7 F (37.1 C)  TempSrc: Oral Oral  Oral  SpO2: 99% 100%  94%  Weight:      Height:        Intake/Output Summary (Last 24 hours) at 03/27/2018 0930 Last data filed at 03/27/2018 4403 Gross per 24 hour  Intake 480 ml  Output 2850 ml  Net -2370 ml   Filed Weights   03/26/18 0019  Weight: 57.1 kg    Exam:  Constitutional:elderly female, no distress Eyes: EOMI, anicteric, normal conjunctivae  ENMT: Oropharynx with moist mucous membranes, normal dentition Cardiovascular: regular rhythm, bradycardic, no MRGs, with no peripheral edema Respiratory: Normal respiratory effort, clear breath sounds  Skin: mild bruising on lateral side of left ankle Neurologic: Grossly no focal neuro deficit. Psychiatric:Appropriate affect, and mood. Mental status alert to self, place. Not to time( month or year)  Data  Reviewed: CBC: Recent Labs  Lab 03/24/18 1155 03/25/18 2024 03/26/18 0327  WBC 9.4 12.9* 12.5*  NEUTROABS 5.6 8.5*  --   HGB 13.3 13.8 12.9  HCT 39.9 41.0 39.3  MCV 91 90.7 93.1  PLT 333 328 440   Basic Metabolic Panel: Recent Labs  Lab 03/20/18 1025 03/24/18 1155 03/25/18 2024 03/25/18 2256 03/26/18 0327  NA 141 138 142  --  138  K 3.9 4.6 3.1*  --  4.9  CL 107 98 105  --  104  CO2 22 24 27   --  25  GLUCOSE 98 86 93  --  100*  BUN 11 12 18   --  14  CREATININE 0.73 0.69 0.72  --  0.76  CALCIUM 8.7* 10.1 9.5  --  8.9  MG  --   --   --  2.2 2.0  PHOS  --   --   --   --  3.8   GFR: Estimated Creatinine Clearance: 44.4 mL/min (by C-G formula based on SCr of 0.76 mg/dL). Liver Function Tests: Recent Labs  Lab 03/25/18 2024 03/26/18 0327  AST 25 136*  ALT 18 76*  ALKPHOS 67 77  BILITOT 0.6 0.6  PROT 6.7 5.8*  ALBUMIN 3.8 3.2*   No results for input(s): LIPASE, AMYLASE in the last 168 hours. No results for input(s): AMMONIA in the last 168 hours. Coagulation Profile: No results for input(s): INR, PROTIME in the last 168 hours. Cardiac Enzymes: Recent Labs  Lab 03/25/18 2256  TROPONINI <0.03   BNP (last 3 results) No results for input(s): PROBNP in the last 8760 hours. HbA1C: No results for input(s): HGBA1C in the last 72 hours. CBG: No results for input(s): GLUCAP in the last 168 hours. Lipid Profile: No results for input(s): CHOL, HDL, LDLCALC, TRIG, CHOLHDL, LDLDIRECT in the last 72 hours. Thyroid Function Tests: Recent Labs    03/26/18 0327  TSH 6.518*   Anemia Panel: No results for input(s): VITAMINB12, FOLATE, FERRITIN, TIBC, IRON, RETICCTPCT in the last 72 hours. Urine analysis:    Component Value Date/Time   COLORURINE STRAW (A) 03/25/2018 2024   APPEARANCEUR CLEAR 03/25/2018 2024   LABSPEC 1.006 03/25/2018 2024   PHURINE 9.0 (H) 03/25/2018 2024   GLUCOSEU NEGATIVE 03/25/2018 2024   HGBUR NEGATIVE 03/25/2018 2024   BILIRUBINUR NEGATIVE  03/25/2018 2024   KETONESUR NEGATIVE 03/25/2018 2024   PROTEINUR NEGATIVE 03/25/2018 2024   UROBILINOGEN 0.2 09/20/2013 0859   NITRITE NEGATIVE 03/25/2018 2024   LEUKOCYTESUR MODERATE (A) 03/25/2018 2024   Sepsis Labs: @LABRCNTIP (procalcitonin:4,lacticidven:4)  )No results found for this or any previous visit (from the past 240 hour(s)).    Studies: No results found.  Scheduled Meds: . chlorhexidine  60 mL Topical Once  . donepezil  10 mg Oral QHS  . gentamicin irrigation  80 mg Irrigation To SS-Surg  . levothyroxine  75 mcg Oral QAC breakfast  . simvastatin  20 mg Oral QPM  . sodium chloride flush  3 mL Intravenous Q12H    Continuous Infusions: . sodium chloride    . sodium chloride    . vancomycin  LOS: 2 days     Desiree Hane, MD [facility] Pager [phone]  If 7PM-7AM, please contact night-coverage [email or web address] Password [email or web address] 03/27/2018, 9:30 AM

## 2018-03-27 NOTE — Progress Notes (Addendum)
Progress Note  Patient Name: Laurie Klein Date of Encounter: 03/27/2018  Primary Cardiologist: Charlsie Quest Electrophysiologist: Dr. Lovena Le  Subjective   No CP or SOB, L ankle is tender  Inpatient Medications    Scheduled Meds: . donepezil  10 mg Oral QHS  . levothyroxine  75 mcg Oral QAC breakfast  . simvastatin  20 mg Oral QPM   Continuous Infusions:  PRN Meds: acetaminophen **OR** acetaminophen, HYDROcodone-acetaminophen, ondansetron **OR** ondansetron (ZOFRAN) IV, polyethylene glycol   Vital Signs    Vitals:   03/26/18 0335 03/26/18 1313 03/26/18 2110 03/27/18 0412  BP: (!) 121/38   (!) 120/52  Pulse: (!) 57 63  70  Resp: 13   (!) 21  Temp: 98.9 F (37.2 C) 99.2 F (37.3 C) 98.8 F (37.1 C) 98.7 F (37.1 C)  TempSrc: Oral Oral  Oral  SpO2: 99% 100%  94%  Weight:      Height:        Intake/Output Summary (Last 24 hours) at 03/27/2018 0746 Last data filed at 03/27/2018 0421 Gross per 24 hour  Intake 720 ml  Output 2600 ml  Net -1880 ml   Filed Weights   03/26/18 0019  Weight: 57.1 kg    Telemetry    SB/SR, generally 60's - Personally Reviewed  ECG    No new EKGs - Personally Reviewed  Physical Exam   GEN: No acute distress.   Neck: No JVD Cardiac: RRR, no murmurs, rubs, or gallops.  Respiratory: Clear to auscultation bilaterally. GI: Soft, nontender, non-distended  MS: No edema; No deformity.L ankle is tender, some ecchymosis, mild swelling (neg xray) Neuro:  Nonfocal.  She is AAO to self, time, place Psych: Normal affect   Labs    Chemistry Recent Labs  Lab 03/20/18 1025 03/24/18 1155 03/25/18 2024 03/26/18 0327  NA 141 138 142 138  K 3.9 4.6 3.1* 4.9  CL 107 98 105 104  CO2 22 24 27 25   GLUCOSE 98 86 93 100*  BUN 11 12 18 14   CREATININE 0.73 0.69 0.72 0.76  CALCIUM 8.7* 10.1 9.5 8.9  PROT  --   --  6.7 5.8*  ALBUMIN  --   --  3.8 3.2*  AST  --   --  25 136*  ALT  --   --  18 76*  ALKPHOS  --   --  67 77  BILITOT  --   --   0.6 0.6  GFRNONAA >60 80 >60 >60  GFRAA >60 92 >60 >60  ANIONGAP 12  --  10 9     Hematology Recent Labs  Lab 03/24/18 1155 03/25/18 2024 03/26/18 0327  WBC 9.4 12.9* 12.5*  RBC 4.37 4.52 4.22  HGB 13.3 13.8 12.9  HCT 39.9 41.0 39.3  MCV 91 90.7 93.1  MCH 30.4 30.5 30.6  MCHC 33.3 33.7 32.8  RDW 13.7 14.8 14.6  PLT 333 328 277    Cardiac Enzymes Recent Labs  Lab 03/25/18 2256  TROPONINI <0.03    Recent Labs  Lab 03/20/18 0901 03/25/18 2101  TROPIPOC 0.00 0.00     BNPNo results for input(s): BNP, PROBNP in the last 168 hours.   DDimer No results for input(s): DDIMER in the last 168 hours.   Radiology    Dg Ribs Unilateral W/chest Right Result Date: 03/25/2018 CLINICAL DATA:  Fall, rib pain on the right EXAM: RIGHT RIBS AND CHEST - 3+ VIEW COMPARISON:  03/20/2018 FINDINGS: Loop recorder device projects over the  left chest. Heart is mildly enlarged. No confluent airspace opacities or effusions. No visible rib fracture. No pneumothorax. IMPRESSION: Cardiomegaly.  No active disease. No visible rib fracture. Electronically Signed   By: Rolm Baptise M.D.   On: 03/25/2018 18:57    Dg Lumbar Spine Complete Result Date: 03/25/2018 CLINICAL DATA:  Fall. EXAM: LUMBAR SPINE - COMPLETE 4+ VIEW COMPARISON:  Abdomen and pelvis CT 09/24/2017 FINDINGS: L2 compression fracture present since prior CT of 09/24/2017 although there may be some or interval loss of vertebral body height. Sclerotic focus in the L1 vertebral body is stable. Degenerative disc disease at L3-4, L4-5, and L5-S1 is similar to prior. SI joints are unremarkable. Bones are diffusely demineralized. IMPRESSION: L2 compression fracture with probable loss of height since 09/24/2017. Degenerative disc disease in the lower lumbar spine without evidence of new fracture. Electronically Signed   By: Misty Stanley M.D.   On: 03/25/2018 18:59    Dg Tibia/fibula Left Result Date: 03/25/2018 CLINICAL DATA:  Fall EXAM: LEFT TIBIA  AND FIBULA - 2 VIEW COMPARISON:  None. FINDINGS: There is no evidence of fracture or other focal bone lesions. Soft tissues are unremarkable. IMPRESSION: Negative. Electronically Signed   By: Rolm Baptise M.D.   On: 03/25/2018 20:43    Dg Foot Complete Left Result Date: 03/25/2018 CLINICAL DATA:  Fall, foot bruising, pain EXAM: LEFT FOOT - COMPLETE 3+ VIEW COMPARISON:  None. FINDINGS: No acute bony abnormality. Specifically, no fracture, subluxation, or dislocation. Joint spaces maintained. Soft tissues intact. IMPRESSION: No acute bony abnormality. Electronically Signed   By: Rolm Baptise M.D.   On: 03/25/2018 20:43    Dg Hip Unilat W Or Wo Pelvis 2-3 Views Right Result Date: 03/25/2018 CLINICAL DATA:  Right hip pain following a fall today. EXAM: DG HIP (WITH OR WITHOUT PELVIS) 2-3V RIGHT COMPARISON:  None. FINDINGS: Normal appearing right hip without fracture or dislocation. Lower lumbar spine degenerative changes. IMPRESSION: No fracture or dislocation. Lower lumbar spine degenerative changes. Electronically Signed   By: Claudie Revering M.D.   On: 03/25/2018 18:58    Cardiac Studies   TTE 04/20/17 - Left ventricle: The cavity size was normal. Wall thickness was normal. Systolic function was normal. The estimated ejection fraction was in the range of 60% to 65%. Wall motion was normal; there were no regional wall motion abnormalities. Doppler parameters are consistent with abnormal left ventricular relaxation (grade 1 diastolic dysfunction). - Aortic valve: There was trivial regurgitation   Patient Profile     84 y.o. female with dementia, anxiety, hypothyroidism, DDD and h/o syncope w/ILR, noted to have bradycardia outpatient in the 30's with an episode of syncope, was planned for PPM implant and ILR removal admitted to [facility] for a fall, perhaps episode of profound weakness, not clearly syncope.  Assessment & Plan    1. Fall, vs episode of weakness 2. Known hx of syncope  associated with bradycardia     She saw Dr. Lovena Le earlier this month with plans for PPM     Unclear exactly what this episode was  The patient recalls discussing PPM implant with Dr. Lovena Le, though not the specifics Unclear what happened this event, symptoms or otherwise. She tells me she stood to go do something and her legs just wouldn't work, she thinks they gave out, she is not certain if she felt lightheaded of near faint, she does not think she fainted this time, but reports she has in the past.  She carries a hx of dementia,  she is forgetful, and is quite unsure of what happened.  She is able to tell me she who she is, the year, and that she is here because she had "an episode".  She could not tell me the name of the president, but described his appearance to me appropriately.    ILR interrogation,noted brady episodes 03/25/18 @14 :40 with rates 28-30bpm  Seems reasonable to proceed with pacing while here.  I discussed the procedure, possible risks/benefits with the patient, she reported understanding, would like to proceed.  She reports signing her own consents.  3. L ankle pain     Xray neg for fracture     C/w medicine team  4. Baseline dementia     C/w medicine team  5. Leukocytosis     Afebrile, pt denies symptoms of illness     Abn UA     Deferred to medicine team     For questions or updates, please contact [place] Please consult [email or web address] for contact info under        Signed, Baldwin Jamaica, PA-C  03/27/2018, 7:46 AM    EP Attending  Patient seen and examined. Agree with above. She has had recurrent symptomatic bradycardia and we will undergo PPM. I have previously discussed the indications/risks/benefits/goals/expectations and she is willing to proceed.  Mikle Bosworth.D.

## 2018-03-27 NOTE — Progress Notes (Addendum)
  I came to do the echo at  11:50 but the patient was not in the room.    Laurie Klein F 03/27/2018, 1:18 PM

## 2018-03-27 NOTE — Progress Notes (Signed)
Pt arrives after procedure( PPM implant). Alert and oriented, Vital remains stable. Dressing is dry clean and intact, no pain at this moment. Will monitor.  Kennyth Lose, RN

## 2018-03-27 NOTE — Progress Notes (Signed)
  Echocardiogram 2D Echocardiogram has been performed.  Laurie Klein 03/27/2018, 4:25 PM

## 2018-03-28 ENCOUNTER — Inpatient Hospital Stay (HOSPITAL_COMMUNITY): Payer: Medicare Other

## 2018-03-28 ENCOUNTER — Encounter (HOSPITAL_COMMUNITY): Payer: Self-pay | Admitting: General Practice

## 2018-03-28 DIAGNOSIS — Z950 Presence of cardiac pacemaker: Secondary | ICD-10-CM

## 2018-03-28 DIAGNOSIS — S82892A Other fracture of left lower leg, initial encounter for closed fracture: Secondary | ICD-10-CM | POA: Diagnosis present

## 2018-03-28 LAB — CBC
HEMATOCRIT: 38.3 % (ref 36.0–46.0)
Hemoglobin: 12.8 g/dL (ref 12.0–15.0)
MCH: 30.5 pg (ref 26.0–34.0)
MCHC: 33.4 g/dL (ref 30.0–36.0)
MCV: 91.2 fL (ref 78.0–100.0)
PLATELETS: 268 10*3/uL (ref 150–400)
RBC: 4.2 MIL/uL (ref 3.87–5.11)
RDW: 14.3 % (ref 11.5–15.5)
WBC: 10.5 10*3/uL (ref 4.0–10.5)

## 2018-03-28 MED FILL — Midazolam HCl Inj 5 MG/5ML (Base Equivalent): INTRAMUSCULAR | Qty: 5 | Status: AC

## 2018-03-28 MED FILL — Lidocaine HCl Local Inj 1%: INTRAMUSCULAR | Qty: 60 | Status: AC

## 2018-03-28 NOTE — Progress Notes (Signed)
Just saw where patient had 15 bts ? WCT Patient was sleeping

## 2018-03-28 NOTE — Consult Note (Signed)
            [facility] CM Primary Care Navigator  03/28/2018  [place] December 30, 1930 [id number]   Went to seepatient at the bedside to identify possible discharge needs. Patient reportsthat she "passed out and was very weak" that had led to this admission. (symptomatic sinus bradycardia status post pacemaker insertion)  Patient reports being a resident at [facility] facility ([facility]) for about 8 years.  Patient endorsesDr.Hal Stoneking with [facility] at [facility] as the primary care provider.   Patientstates that facility staff dispense and administer her medications.  She mentioned using facility transportation [place] to her doctors'appointments.  Patient verbalized that facility staff provide assistance with her care needs.   Anticipated discharge planis SNF- skilled nursing facility per therapy recommendation. Patient states that she will return back to [facility] rehab (at the skilled nursing living level of care).   Patientexpressedunderstanding to call primary care provider's office for a post discharge follow-up appointment within1- 2 weeksor sooner if needs arise.Patient letter (with PCP's contact number) was provided as her reminder.  Explained to patient about [facility] CM services available for health management at homebut indicated having no current needs at this point. She mentioned that staff/ nurse comes to check on her and assists her if needed.  Patientvoiced understanding to seekreferral to [facility] care management from primary care provider if deemed necessary and appropriate for servicesin the near future.   [facility] care management information provided for future needs that she may have.   For additional questions please contact:  Edwena Felty A. Lajean Boese, BSN, RN-BC [facility] PRIMARY CARE Navigator Cell: [phone]

## 2018-03-28 NOTE — Progress Notes (Signed)
Physical Therapy Treatment Patient Details Name: Laurie Klein MRN: [id number] DOB: 04-Oct-1930 Today's Date: 03/28/2018    History of Present Illness Laurie Klein is a 84 y.o. year old female with medical history significant for anxiety, dementia, hypothyroidism, and syncope s/p ILR implant  who presented on 03/25/2018 with fall and was found to have symptomatic bradycardia.  Pt underwent pacer placement on 03/27/18. Since presenting after a fall x-ray in the ED of left tibia, left foot, ribs, lumbar spine, hip were negative for any acute fracture.    PT Comments    Pt remains very limited with mobility due to continued lt ankle pain. Continue to recommend ST-SNF.    Follow Up Recommendations  SNF     Equipment Recommendations  Other (comment)(TBD)    Recommendations for Other Services       Precautions / Restrictions Precautions Precautions: Fall Restrictions Weight Bearing Restrictions: No Other Position/Activity Restrictions: Pt unable to bear weight on LLE due to pain and decreased weight bearing on rt due to pain    Mobility  Bed Mobility Overal bed mobility: Needs Assistance Bed Mobility: Rolling;Sidelying to Sit;Sit to Supine Rolling: Min assist Sidelying to sit: Mod assist   Sit to supine: +2 for physical assistance;Mod assist   General bed mobility comments: Assist to elevate trunk into sitting and bring hips to EOB. Assist to lower trunk and bring legs up into bed  Transfers                 General transfer comment: Attempted to stand with walker but pt unable to bear any weight on LLE and unable to raise hips with +2 max assist. Work on lateral scooting on EOB.   Ambulation/Gait                 Stairs             Wheelchair Mobility    Modified Rankin (Stroke Patients Only)       Balance                                            Cognition Arousal/Alertness: Awake/alert Behavior During Therapy: WFL for tasks  assessed/performed Overall Cognitive Status: No family/caregiver present to determine baseline cognitive functioning                                 General Comments: Pt with history of dementia. Poor short term Therapist, occupational Exercises - Lower Extremity Long Arc Quad: 10 reps;Both;Seated;AROM    General Comments        Pertinent Vitals/Pain Pain Assessment: Faces Faces Pain Scale: Hurts whole lot Pain Location: lt ankle Pain Descriptors / Indicators: Sharp;Aching Pain Intervention(s): Limited activity within patient's tolerance;Monitored during session;Repositioned    Home Living                      Prior Function            PT Goals (current goals can now be found in the care plan section) Progress towards PT goals: Not progressing toward goals - comment(continued lt ankle pain )    Frequency    Min 2X/week      PT Plan Current plan remains appropriate;Frequency needs to be updated    Co-evaluation  AM-PAC PT "6 Clicks" Daily Activity  Outcome Measure  Difficulty turning over in bed (including adjusting bedclothes, sheets and blankets)?: Unable Difficulty moving from lying on back to sitting on the side of the bed? : Unable Difficulty sitting down on and standing up from a chair with arms (e.g., wheelchair, bedside commode, etc,.)?: Unable Help needed moving to and from a bed to chair (including a wheelchair)?: Total Help needed walking in hospital room?: Total Help needed climbing 3-5 steps with a railing? : Total 6 Click Score: 6    End of Session Equipment Utilized During Treatment: Gait belt Activity Tolerance: Patient limited by pain Patient left: in bed;with call bell/phone within reach;with bed alarm set Nurse Communication: Mobility status PT Visit Diagnosis: Unsteadiness on feet (R26.81);Pain;Muscle weakness (generalized) (M62.81);Repeated falls (R29.6) Pain - Right/Left: Right(BLE with  rt>lt) Pain - part of body: Ankle and joints of foot     Time: 0623-7628 PT Time Calculation (min) (ACUTE ONLY): 32 min  Charges:  $Therapeutic Activity: 23-37 mins                     [place] Pager [phone] Office [place] 03/28/2018, 4:35 PM

## 2018-03-28 NOTE — Progress Notes (Addendum)
PROGRESS NOTE  Laurie Klein YQM:[id number] DOB: Sep 03, 1930 DOA: 03/25/2018 PCP: Lajean Manes, MD  HPI/Brief Narrative  Laurie Klein is a 84 y.o. year old female with medical history significant for anxiety, dementia, hypothyroidism, and syncope s/p ILR implant  who presented on 03/25/2018 with fall and was found to have symptomatic bradycardia.  Work-up to date troponin trend has been negative, Infection work-up unremarkable.  Since presenting after a fall x-ray in the ED of left tibia, left foot, ribs, lumbar spine, hip were negative for any acute fracture  She is followed by Dr. Lovena Le (cardiology) as outpatient.  Last seen on 9/20 with plan to undergo outpatient permanent pacemaker before this admission given concern for sinus node dysfunction based off arrhythmia findings.  Subjective Still having left ankle pain.  Assessment/Plan:  #Unwitnessed syncope and fall secondary to symptomatic sinus bradycardia.  S/p pacemaker insertion on 9/25 by cardiology, outpatient follow-up arranged.  #Unwitnessed fall, related to above problem. No fractures on xrays ( left tibia, left foot, ribs, lumbar spine, hip).  Pain control Prn norco 1-2 tabs q4Hrprn moderate pain. PT eval  #Acute left calcaneus fracture, due to above. Mild bruising on lateral ankle, painful with movement. Still good mobility and pulses. XR negative for fracture; however CT imaging on 9/24 showed acute fracture. [place] consulted f  #Leukocytosis. Suspect stress leukocytosis related to fall, resolved. No localizing signs or symptoms, remained afebrile. Ua with moderate leukocytes but rare bacteria and negative nitrite not concerning for infection with no dysuria.    #Dementia.  Alert and oriented x4.  Mental status at baseline currently.  Delirium precautions. PTA aricept  #Hypokalemia, resolved.  Status post oral repletion on admission  #Hyperlipidemia, stable.  Continue Zocor  #Hypothyroidism, stable.  TSH  at 6, appropriate for age.  Continue Synthroid    Code Status: DNR   Family Communication: no family at bedside   Disposition Plan: ortho consulted   Consultants:  Treatment Team:   Lbcardiology, [place], MD  Orthopedics  Procedures:  TTE pending   Antimicrobials: Anti-infectives (From admission, onward)   Start     Dose/Rate Route Frequency Ordered Stop   03/27/18 2215  vancomycin (VANCOCIN) IVPB 1000 mg/200 mL premix     1,000 mg 200 mL/hr over 60 Minutes Intravenous Every 12 hours 03/27/18 1340 03/27/18 2232   03/27/18 1210  gentamicin (GARAMYCIN) 80 mg in sodium chloride 0.9 % 500 mL irrigation     80 mg Irrigation To ShortStay Surgical 03/27/18 0910 03/27/18 1235   03/27/18 1210  vancomycin (VANCOCIN) IVPB 1000 mg/200 mL premix     1,000 mg 200 mL/hr over 60 Minutes Intravenous To ShortStay Surgical 03/27/18 0910 03/27/18 1243        Cultures:  none  Telemetry:yes  DVT prophylaxis: SCDs   Objective: Vitals:   03/27/18 1600 03/27/18 1700 03/27/18 1937 03/28/18 0439  BP: (!) 128/54 (!) 126/58 122/64 139/71  Pulse: 87 82 84 82  Resp:  18 (!) 21 17  Temp:  98.9 F (37.2 C) 99.6 F (37.6 C) 99.5 F (37.5 C)  TempSrc:  Oral Oral Oral  SpO2: 100% 99% 95% 95%  Weight:      Height:        Intake/Output Summary (Last 24 hours) at 03/28/2018 0836 Last data filed at 03/28/2018 0445 Gross per 24 hour  Intake 901.37 ml  Output 1500 ml  Net -598.63 ml   Filed Weights   03/26/18 0019  Weight: 57.1 kg  Exam:  Constitutional:elderly female, eating breakfast Eyes: EOMI, ENMT: moist mucus membranes, normal dentition Cardiovascular: RRR, no murmurs, no peripheral edema Respiratory: normal respiratory effort Skin: pacemaker site in left upper chest clean /dry,/intact with dressing in place.  MSK: left arm sling( ipsilateral side as pacemaker, left ankle with bruising on lateral side, still mobile but with pain Neurologic: Grossly no focal neuro  deficit. Psychiatric:Appropriate affect, and mood. Mental status alert to self, place. Not to time( month or year)  Data Reviewed: CBC: Recent Labs  Lab 03/24/18 1155 03/25/18 2024 03/26/18 0327  WBC 9.4 12.9* 12.5*  NEUTROABS 5.6 8.5*  --   HGB 13.3 13.8 12.9  HCT 39.9 41.0 39.3  MCV 91 90.7 93.1  PLT 333 328 626   Basic Metabolic Panel: Recent Labs  Lab 03/24/18 1155 03/25/18 2024 03/25/18 2256 03/26/18 0327  NA 138 142  --  138  K 4.6 3.1*  --  4.9  CL 98 105  --  104  CO2 24 27  --  25  GLUCOSE 86 93  --  100*  BUN 12 18  --  14  CREATININE 0.69 0.72  --  0.76  CALCIUM 10.1 9.5  --  8.9  MG  --   --  2.2 2.0  PHOS  --   --   --  3.8   GFR: Estimated Creatinine Clearance: 44.4 mL/min (by C-G formula based on SCr of 0.76 mg/dL). Liver Function Tests: Recent Labs  Lab 03/25/18 2024 03/26/18 0327  AST 25 136*  ALT 18 76*  ALKPHOS 67 77  BILITOT 0.6 0.6  PROT 6.7 5.8*  ALBUMIN 3.8 3.2*   No results for input(s): LIPASE, AMYLASE in the last 168 hours. No results for input(s): AMMONIA in the last 168 hours. Coagulation Profile: No results for input(s): INR, PROTIME in the last 168 hours. Cardiac Enzymes: Recent Labs  Lab 03/25/18 2256  TROPONINI <0.03   BNP (last 3 results) No results for input(s): PROBNP in the last 8760 hours. HbA1C: No results for input(s): HGBA1C in the last 72 hours. CBG: No results for input(s): GLUCAP in the last 168 hours. Lipid Profile: No results for input(s): CHOL, HDL, LDLCALC, TRIG, CHOLHDL, LDLDIRECT in the last 72 hours. Thyroid Function Tests: Recent Labs    03/26/18 0327  TSH 6.518*   Anemia Panel: No results for input(s): VITAMINB12, FOLATE, FERRITIN, TIBC, IRON, RETICCTPCT in the last 72 hours. Urine analysis:    Component Value Date/Time   COLORURINE STRAW (A) 03/25/2018 2024   APPEARANCEUR CLEAR 03/25/2018 2024   LABSPEC 1.006 03/25/2018 2024   PHURINE 9.0 (H) 03/25/2018 2024   GLUCOSEU NEGATIVE  03/25/2018 2024   HGBUR NEGATIVE 03/25/2018 2024   BILIRUBINUR NEGATIVE 03/25/2018 2024   KETONESUR NEGATIVE 03/25/2018 2024   PROTEINUR NEGATIVE 03/25/2018 2024   UROBILINOGEN 0.2 09/20/2013 0859   NITRITE NEGATIVE 03/25/2018 2024   LEUKOCYTESUR MODERATE (A) 03/25/2018 2024   Sepsis Labs: @LABRCNTIP (procalcitonin:4,lacticidven:4)  ) Recent Results (from the past 240 hour(s))  Surgical PCR screen     Status: None   Collection Time: 03/27/18 11:04 AM  Result Value Ref Range Status   MRSA, PCR NEGATIVE NEGATIVE Final   Staphylococcus aureus NEGATIVE NEGATIVE Final    Comment: (NOTE) The Xpert SA Assay (FDA approved for NASAL specimens in patients 13 years of age and older), is one component of a comprehensive surveillance program. It is not intended to diagnose infection nor to guide or monitor treatment. Performed at [facility]  Lab, [address], [place], [place] [address]       Studies: No results found.  Scheduled Meds: . donepezil  10 mg Oral QHS  . levothyroxine  75 mcg Oral QAC breakfast  . simvastatin  20 mg Oral QPM    Continuous Infusions:    LOS: 3 days     Desiree Hane, MD [facility] Pager [phone]  If 7PM-7AM, please contact night-coverage [email or web address] Password [facility] 03/28/2018, 8:36 AM

## 2018-03-28 NOTE — Care Management Important Message (Signed)
Important Message  Patient Details  Name: Laurie Klein MRN: [id number] Date of Birth: 05-11-1931   Medicare Important Message Given:  Yes    Barb Merino [place] 03/28/2018, 3:31 PM

## 2018-03-28 NOTE — Progress Notes (Addendum)
Progress Note  Patient Name: Laurie Klein Date of Encounter: 03/28/2018  Primary Cardiologist: Charlsie Quest Electrophysiologist: Dr. Lovena Le  Subjective   No CP or SOB, L ankle remains tender.  Denies any discomfort at either procedure site  Inpatient Medications    Scheduled Meds: . donepezil  10 mg Oral QHS  . levothyroxine  75 mcg Oral QAC breakfast  . simvastatin  20 mg Oral QPM   Continuous Infusions:  PRN Meds: acetaminophen **OR** acetaminophen, acetaminophen, HYDROcodone-acetaminophen, ondansetron **OR** ondansetron (ZOFRAN) IV, ondansetron (ZOFRAN) IV, polyethylene glycol   Vital Signs    Vitals:   03/27/18 1600 03/27/18 1700 03/27/18 1937 03/28/18 0439  BP: (!) 128/54 (!) 126/58 122/64 139/71  Pulse: 87 82 84 82  Resp:  18 (!) 21 17  Temp:  98.9 F (37.2 C) 99.6 F (37.6 C) 99.5 F (37.5 C)  TempSrc:  Oral Oral Oral  SpO2: 100% 99% 95% 95%  Weight:      Height:        Intake/Output Summary (Last 24 hours) at 03/28/2018 0908 Last data filed at 03/28/2018 0445 Gross per 24 hour  Intake 901.37 ml  Output 500 ml  Net 401.37 ml   Filed Weights   03/26/18 0019  Weight: 57.1 kg    Telemetry    SR  - Personally Reviewed  ECG    AP/VS - Personally Reviewed  Physical Exam   GEN: No acute distress.   Neck: No JVD Cardiac: RRR, no murmurs, rubs, or gallops.  Respiratory: CTA b/l. GI: Soft, nontender, non-distended  MS: No edema; No deformity.L ankle is tender, some ecchymosis, mild swelling (neg xray) Neuro:  Nonfocal.  She is AAO to self, time, place Psych: Normal affect  PPM site is dry, no hematoma, ecchymosis ILR explant site, dry, no hematoma or ecchymosis  Labs    Chemistry Recent Labs  Lab 03/24/18 1155 03/25/18 2024 03/26/18 0327  NA 138 142 138  K 4.6 3.1* 4.9  CL 98 105 104  CO2 24 27 25   GLUCOSE 86 93 100*  BUN 12 18 14   CREATININE 0.69 0.72 0.76  CALCIUM 10.1 9.5 8.9  PROT  --  6.7 5.8*  ALBUMIN  --  3.8 3.2*  AST  --   25 136*  ALT  --  18 76*  ALKPHOS  --  67 77  BILITOT  --  0.6 0.6  GFRNONAA 80 >60 >60  GFRAA 92 >60 >60  ANIONGAP  --  10 9     Hematology Recent Labs  Lab 03/24/18 1155 03/25/18 2024 03/26/18 0327  WBC 9.4 12.9* 12.5*  RBC 4.37 4.52 4.22  HGB 13.3 13.8 12.9  HCT 39.9 41.0 39.3  MCV 91 90.7 93.1  MCH 30.4 30.5 30.6  MCHC 33.3 33.7 32.8  RDW 13.7 14.8 14.6  PLT 333 328 277    Cardiac Enzymes Recent Labs  Lab 03/25/18 2256  TROPONINI <0.03    Recent Labs  Lab 03/25/18 2101  TROPIPOC 0.00     BNPNo results for input(s): BNP, PROBNP in the last 168 hours.   DDimer No results for input(s): DDIMER in the last 168 hours.   Radiology    Dg Ribs Unilateral W/chest Right Result Date: 03/25/2018 CLINICAL DATA:  Fall, rib pain on the right EXAM: RIGHT RIBS AND CHEST - 3+ VIEW COMPARISON:  03/20/2018 FINDINGS: Loop recorder device projects over the left chest. Heart is mildly enlarged. No confluent airspace opacities or effusions. No visible rib fracture.  No pneumothorax. IMPRESSION: Cardiomegaly.  No active disease. No visible rib fracture. Electronically Signed   By: Rolm Baptise M.D.   On: 03/25/2018 18:57    Dg Lumbar Spine Complete Result Date: 03/25/2018 CLINICAL DATA:  Fall. EXAM: LUMBAR SPINE - COMPLETE 4+ VIEW COMPARISON:  Abdomen and pelvis CT 09/24/2017 FINDINGS: L2 compression fracture present since prior CT of 09/24/2017 although there may be some or interval loss of vertebral body height. Sclerotic focus in the L1 vertebral body is stable. Degenerative disc disease at L3-4, L4-5, and L5-S1 is similar to prior. SI joints are unremarkable. Bones are diffusely demineralized. IMPRESSION: L2 compression fracture with probable loss of height since 09/24/2017. Degenerative disc disease in the lower lumbar spine without evidence of new fracture. Electronically Signed   By: Misty Stanley M.D.   On: 03/25/2018 18:59    Dg Tibia/fibula Left Result Date:  03/25/2018 CLINICAL DATA:  Fall EXAM: LEFT TIBIA AND FIBULA - 2 VIEW COMPARISON:  None. FINDINGS: There is no evidence of fracture or other focal bone lesions. Soft tissues are unremarkable. IMPRESSION: Negative. Electronically Signed   By: Rolm Baptise M.D.   On: 03/25/2018 20:43    Dg Foot Complete Left Result Date: 03/25/2018 CLINICAL DATA:  Fall, foot bruising, pain EXAM: LEFT FOOT - COMPLETE 3+ VIEW COMPARISON:  None. FINDINGS: No acute bony abnormality. Specifically, no fracture, subluxation, or dislocation. Joint spaces maintained. Soft tissues intact. IMPRESSION: No acute bony abnormality. Electronically Signed   By: Rolm Baptise M.D.   On: 03/25/2018 20:43    Dg Hip Unilat W Or Wo Pelvis 2-3 Views Right Result Date: 03/25/2018 CLINICAL DATA:  Right hip pain following a fall today. EXAM: DG HIP (WITH OR WITHOUT PELVIS) 2-3V RIGHT COMPARISON:  None. FINDINGS: Normal appearing right hip without fracture or dislocation. Lower lumbar spine degenerative changes. IMPRESSION: No fracture or dislocation. Lower lumbar spine degenerative changes. Electronically Signed   By: Claudie Revering M.D.   On: 03/25/2018 18:58    Cardiac Studies   TTE 04/20/17 - Left ventricle: The cavity size was normal. Wall thickness was normal. Systolic function was normal. The estimated ejection fraction was in the range of 60% to 65%. Wall motion was normal; there were no regional wall motion abnormalities. Doppler parameters are consistent with abnormal left ventricular relaxation (grade 1 diastolic dysfunction). - Aortic valve: There was trivial regurgitation   Patient Profile     84 y.o. female with dementia, anxiety, hypothyroidism, DDD and h/o syncope w/ILR, noted to have bradycardia outpatient in the 30's with an episode of syncope, was planned for PPM implant and ILR removal admitted to [facility] for a fall, perhaps episode of profound weakness, not clearly syncope.  Assessment & Plan    1. Fall, vs  episode of weakness 2. Known hx of syncope associated with bradycardia      Now s/p PPM implant with Dr. Lovena Le yesterday No CP or SOB this AM PPM site and ILR removal site look good, no bleeding, hematoma, ecchymosis Device interrogation this AM with intact function CXR is done, no ptx Site care, and L arm restrictions were discussed with the patient, written instructions provided in AVS Routine EP follow up is in place    3. L ankle pain, continues     Xray neg for fracture     C/w medicine team, though may consider re-imaging (?)  4. Baseline dementia     C/w medicine team  5. Leukocytosis     Afebrile, pt denies symptoms  of illness     Abn UA     Deferred to medicine team   EP will sign off and OK to discharge without changes to home meds from our perspective.     For questions or updates, please contact [place] Please consult [email or web address] for contact info under        Signed, Baldwin [place], PA-C  03/28/2018, 9:08 AM    EP Attending  Patient seen and examined. Her device interogation demonstrates normal DDD PM function. She is stable for DC home from cardiology perspective with usual followup. CXR looks good.  Mikle Bosworth.D.

## 2018-03-29 ENCOUNTER — Encounter (HOSPITAL_COMMUNITY): Payer: Self-pay | Admitting: Internal Medicine

## 2018-03-29 DIAGNOSIS — R55 Syncope and collapse: Secondary | ICD-10-CM

## 2018-03-29 DIAGNOSIS — S82892A Other fracture of left lower leg, initial encounter for closed fracture: Secondary | ICD-10-CM

## 2018-03-29 DIAGNOSIS — S92025A Nondisplaced fracture of anterior process of left calcaneus, initial encounter for closed fracture: Secondary | ICD-10-CM

## 2018-03-29 MED ORDER — ACETAMINOPHEN 325 MG PO TABS: 650.0000 mg | ORAL_TABLET | ORAL | 2 refills | Status: DC | PRN

## 2018-03-29 MED ORDER — HYDROCODONE-ACETAMINOPHEN 5-325 MG PO TABS: 1.0000 | ORAL_TABLET | ORAL | 0 refills | Status: DC | PRN

## 2018-03-29 MED ORDER — POLYETHYLENE GLYCOL 3350 17 G PO PACK: 17.0000 g | PACK | Freq: Every day | ORAL | 0 refills | Status: DC | PRN

## 2018-03-29 NOTE — Progress Notes (Signed)
Patient will Discharge To:[name] Anticipated DC Date:03/29/18 Family Notified: left voice message for son, Laurie Klein [phone] Transport XI:PPND   Per MD patient ready for DC to [facility] . RN, patient, patient's family, and facility notified of DC. Assessment, Fl2/Pasrr, and Discharge Summary sent to facility. RN given number for report [phone], Room 306 B). DC packet on chart. Ambulance transport requested for patient. Facility has ask for a 3:00pm transport.  CSW signing off.  [name] LCSWA [phone]

## 2018-03-29 NOTE — Care Management Note (Signed)
Case Management Note Marvetta Gibbons RN, BSN Unit 4E- RN Care Coordinator  [phone]  Patient Details  Name: Laurie Klein MRN: [id number] Date of Birth: 1931/01/02  Subjective/Objective:  Pt admitted with symptomatic bradycardia - s/p PPM                  Action/Plan: PTA pt was at [facility] ALF, plan to return to rehab bed at [place] following for transition of care needs.   Expected Discharge Date:  03/29/18               Expected Discharge Plan:  [place]  In-House Referral:  Clinical Social Work  Discharge planning Services  CM Consult  Post Acute Care Choice:  NA Choice offered to:  NA  DME Arranged:    DME Agency:     HH Arranged:    [place] Agency:     Status of Service:  Completed, signed off  If discussed at [employer] of Stay Meetings, dates discussed:    Discharge Disposition: skilled facility   Additional Comments:  Dawayne Patricia, RN 03/29/2018, 1:23 PM

## 2018-03-29 NOTE — Progress Notes (Signed)
Report given to [facility] , Trilby Leaver, RN. All Pt's belongings gave back to her. AVS put in envelope gave to staff from [place].  Discharged instruction gave to Pt. She did not have any questions at this time. Pt left the unit at 3.38 pm.  Kennyth Lose, RN

## 2018-03-29 NOTE — Consult Note (Addendum)
Reason for Consult: Left ankle pain Referring Physician: Dr Audelia Acton Laurie Klein is an 84 y.o. female.  HPI: Laurie Klein is a patient who had a syncopal episode several days ago.  She injured her left ankle during the fall.  Denies any other orthopedic complaints.  Has had difficulty weightbearing.  CT scan obtained shows small anterolateral calcaneus fracture which is minimally displaced.  She is tender to palpation in this region.  She is ready for discharge otherwise.  Past Medical History:  Diagnosis Date  . Anxiety   . Chronic headaches    history of  . DDD (degenerative disc disease) 05/24/2013  . Degenerative disc disease   . Dyslipidemia   . Headache(784.0): chronic 05/24/2013  . Heart palpitations   . Hypothyroidism   . Mitral valve prolapse   . MVP (mitral valve prolapse) 05/24/2013  . PAC (premature atrial contraction)   . Presence of permanent cardiac pacemaker 03/27/2018  . Spinal stenosis   . TMJ syndrome     Past Surgical History:  Procedure Laterality Date  . APPENDECTOMY    . BACK SURGERY    . BREAST BIOPSY  3 times  . BREAST EXCISIONAL BIOPSY Right 1970   benign  . BREAST EXCISIONAL BIOPSY Left 1950   2 benign  . BREAST EXCISIONAL BIOPSY Right 1950   benign  . CHOLECYSTECTOMY    . LOOP RECORDER INSERTION N/A 01/17/2018   Procedure: LOOP RECORDER INSERTION;  Surgeon: Laurie Lance, MD;  Location: [facility];  Service: Cardiovascular;  Laterality: N/A;  . LOOP RECORDER REMOVAL N/A 03/27/2018   Procedure: LOOP RECORDER REMOVAL;  Surgeon: Laurie Lance, MD;  Location: [facility];  Service: Cardiovascular;  Laterality: N/A;  . LUMBAR MICRODISCECTOMY    . PACEMAKER IMPLANT N/A 03/27/2018   Procedure: PACEMAKER IMPLANT;  Surgeon: Laurie Lance, MD;  Location: [facility];  Service: Cardiovascular;  Laterality: N/A;  . TONSILLECTOMY      Family History  Problem Relation Age of Onset  . Heart failure Mother 33  . Stroke Mother   .  Heart disease Father   . Melanoma Child     Social History:  reports that she quit smoking about 44 years ago. Her smoking use included cigarettes. She smoked 0.50 packs per day. She has never used smokeless tobacco. She reports that she drinks about 3.0 standard drinks of alcohol per week. She reports that she does not use drugs.  Allergies:  Allergies  Allergen Reactions  . Acrylic Polymer Other (See Comments)    Blisters inside mouth   . Lipitor [Atorvastatin Calcium] Other (See Comments)    Myalgias and muscle aches   . Orudis Kt Other (See Comments)    Unknown   . Sulfa Antibiotics Itching  . Amoxicillin Itching and Rash    Has patient had a PCN reaction causing immediate rash, facial/tongue/throat swelling, SOB or lightheadedness with hypotension: Yes Has patient had a PCN reaction causing severe rash involving mucus membranes or skin necrosis: No Has patient had a PCN reaction that required hospitalization: No Has patient had a PCN reaction occurring within the last 10 years: No If all of the above answers are "NO", then may proceed with Cephalosporin use.     Medications: I have reviewed the patient's current medications.  Results for orders placed or performed during the hospital encounter of 03/25/18 (from the past 48 hour(s))  Surgical PCR screen     Status: None  Collection Time: 03/27/18 11:04 AM  Result Value Ref Range   MRSA, PCR NEGATIVE NEGATIVE   Staphylococcus aureus NEGATIVE NEGATIVE    Comment: (NOTE) The Xpert SA Assay (FDA approved for NASAL specimens in patients 37 years of age and older), is one component of a comprehensive surveillance program. It is not intended to diagnose infection nor to guide or monitor treatment. Performed at [facility], [place] [address], [place] [address]   CBC     Status: None   Collection Time: 03/28/18  8:48 AM  Result Value Ref Range   WBC 10.5 4.0 - 10.5 K/uL   RBC 4.20 3.87 - 5.11 MIL/uL   Hemoglobin  12.8 12.0 - 15.0 g/dL   HCT 38.3 36.0 - 46.0 %   MCV 91.2 78.0 - 100.0 fL   MCH 30.5 26.0 - 34.0 pg   MCHC 33.4 30.0 - 36.0 g/dL   RDW 14.3 11.5 - 15.5 %   Platelets 268 150 - 400 K/uL    Comment: Performed at [facility], [place] [address], [place], [place] [address]    Dg Chest 2 View  Result Date: 03/28/2018 CLINICAL DATA:  Status post pacemaker placement. Considerable pain this morning. EXAM: CHEST - 2 VIEW COMPARISON:  Pre pacemaker placement study of March 25, 2018 FINDINGS: The lungs are well-expanded. There is no pneumothorax or pleural effusion. The heart and pulmonary vascularity are normal. The pacemaker electrodes are in reasonable position radiographically. The generator projects over the lateral aspect of the left pectoral region. The bony thorax exhibits no acute abnormality. IMPRESSION: COPD. No CHF. No immediate postprocedure complication following permanent pacemaker placement. Electronically Signed   By: Laurie  Klein M.D.   On: 03/28/2018 10:19   Ct Ankle Left Wo Contrast  Result Date: 03/28/2018 CLINICAL DATA:  Persistent left ankle pain, query fracture. EXAM: CT OF THE LEFT ANKLE WITHOUT CONTRAST TECHNIQUE: Multidetector CT imaging of the left ankle was performed according to the standard protocol. Multiplanar CT image reconstructions were also generated. COMPARISON:  Foot radiographs 03/25/2018 FINDINGS: Bones/Joint/Cartilage A fracture of the anterolateral process of the calcaneus is noted, series 3/96 and series 8/images 36 and 37 with overlying soft tissue swelling is noted. No joint dislocation or suspicious osseous lesions. The Lisfranc articulation and midfoot appear intact. The tibiotalar and subtalar joints are nonacute. Subchondral degenerative cystic change of the talar dome across the ankle joint consistent with osteoarthritis. Dorsal calcaneal enthesopathy is seen. Ligaments Suboptimally assessed by CT. Muscles and Tendons Noncontributory Soft tissues Soft  tissue swelling about the malleoli and dorsum of the included mid and forefoot. IMPRESSION: 1. Acute appearing anterolateral process fracture of the calcaneus with overlying soft tissue swelling. 2. Osteoarthritis of the tibiotalar joint. 3. Calcaneal enthesopathy. Electronically Signed   By: Ashley Royalty M.D.   On: 03/28/2018 18:51    Review of Systems  Musculoskeletal: Positive for joint pain.  All other systems reviewed and are negative.  Blood pressure (!) 109/58, pulse 67, temperature 98.5 F (36.9 C), temperature source Oral, resp. rate 12, height 5\' 4"  (1.626 m), weight 57.1 kg, SpO2 96 %. Physical Exam  Constitutional: She appears well-developed.  HENT:  Head: Normocephalic.  Eyes: Pupils are equal, round, and reactive to light.  Neck: Normal range of motion.  Cardiovascular: Normal rate.  Respiratory: Effort normal.  Neurological: She is alert.  Skin: Skin is warm.  Psychiatric: She has a normal mood and affect.  Examination of the left foot demonstrates mild swelling.  Compartments are soft.  Foot is perfused.  Ankle dorsiflexion plantarflexion intact but slightly weaker on the left compared to the right.  There is tenderness to palpation around the calcaneus but less around the midfoot and forefoot  Assessment/Plan: Impression is anterolateral process calcaneus fracture.  Plan is  weightbearing as tolerated in fracture boot with physical therapy today.  This should not require surgical treatment.  The fracture is small.  Anticipate resolution within 3 weeks.  She will need to follow-up with me in 3 weeks for clinical recheck.  Okay to not be in the fracture boot when she is in bed but when she is walking it would help her to get around to be in the fracture boot.  With this fracture which is more or less an avulsion fracture I think that she should be okay to weight-bear in the fracture boot.  I do not think it is a great idea with the pacemaker placement that she use that left arm  for the walker.  For that reason weightbearing as tolerated in the fracture boot is acceptable at this time.  Landry Dyke Anyssa Sharpless 03/29/2018, 8:17 AM

## 2018-03-29 NOTE — Discharge Instructions (Signed)
1) weightbearing as tolerated in fracture boot with physical therapy today.   2)follow-up with orthopedics in 3 weeks for clinical recheck.  3) Okay to not be in the fracture boot when she is in bed but when she is walking it would help her to get around to be in the fracture boot.   4) okay to weight-bear in the fracture boot.  5) left upper extremity restrictions as advised by cardiology team due to status post pacemaker placement 6) outpatient cardiology follow-up as advised by cardiology team     Supplemental Discharge Instructions for  Pacemaker/Defibrillator Patients  Activity No heavy lifting or vigorous activity with your left/right arm for 6 to 8 weeks.  Do not raise your left/right arm above your head for one week.  Gradually raise your affected arm as drawn below.  Okay to be  weightbearing as tolerated on left leg and fracture boot.  Okay to be out of fracture boot when in bed.  Follow-up in 3 weeks for repeat clinical evaluation at [facility] orthopedics              03/31/18                   04/01/18                    04/02/18                    04/03/18  __  NO DRIVING the patient does not drive  WOUND CARE - Keep the wound area clean and dry.  Do not get this area wet for one week. No showers for one week; you may shower on  04/03/18 . - The tape/steri-strips on your wound will fall off; do not pull them off.  No bandage is needed on the site.  DO  NOT apply any creams, oils, or ointments to the wound area. - If you notice any drainage or discharge from the wound, any swelling or bruising at the site, or you develop a fever > 101? F after you are discharged home, call the office at once.  Special Instructions - You are still able to use cellular telephones; use the ear opposite the side where you have your pacemaker/defibrillator.  Avoid carrying your cellular phone near your device. - When traveling through airports, show security personnel your identification card to  avoid being screened in the metal detectors.  Ask the security personnel to use the hand wand. - Avoid arc welding equipment, MRI testing (magnetic resonance imaging), TENS units (transcutaneous nerve stimulators).  Call the office for questions about other devices. - Avoid electrical appliances that are in poor condition or are not properly grounded. - Microwave ovens are safe to be near or to operate.  Additional information for defibrillator patients should your device go off: - If your device goes off ONCE and you feel fine afterward, notify the device clinic nurses. - If your device goes off ONCE and you do not feel well afterward, call 911. - If your device goes off TWICE, call 911. - If your device goes off THREE times in one day, call 911.  DO NOT DRIVE YOURSELF OR A FAMILY MEMBER WITH A DEFIBRILLATOR TO THE HOSPITAL--CALL 911.  1) weightbearing as tolerated in fracture boot with physical therapy today.   2)follow-up with orthopedics in 3 weeks for clinical recheck.  3) Okay to not be in the fracture boot when she is in bed but when  she is walking it would help her to get around to be in the fracture boot.   4) okay to weight-bear in the fracture boot.  5) left upper extremity restrictions as advised by cardiology team due to status post pacemaker placement 6) outpatient cardiology follow-up as advised by cardiology team

## 2018-03-29 NOTE — Discharge Summary (Signed)
Laurie Klein Orth, is a 84 y.o. female  DOB Jun 16, 1931  MRN [id number].  Admission date:  03/25/2018  Admitting Physician  Toy Baker, MD  Discharge Date:  03/29/2018   Primary MD  Lajean Manes, MD  Recommendations for primary care physician for things to follow:  1) weightbearing as tolerated in fracture boot with physical therapy today.   2)follow-up with orthopedics in 3 weeks for clinical recheck.  3) Okay to not be in the fracture boot when she is in bed but when she is walking it would help her to get around to be in the fracture boot.   4) okay to weight-bear in the fracture boot.  5) left upper extremity restrictions as advised by cardiology team due to status post pacemaker placement 6) outpatient cardiology follow-up as advised by cardiology team   Admission Diagnosis  Symptomatic bradycardia [R00.1]   Discharge Diagnosis  Symptomatic bradycardia [R00.1]    Active Problems:   Dyslipidemia   Hypothyroidism   Syncope   Symptomatic bradycardia   Dementia   Leukocytosis   Closed left ankle fracture      Past Medical History:  Diagnosis Date  . Anxiety   . Chronic headaches    history of  . DDD (degenerative disc disease) 05/24/2013  . Degenerative disc disease   . Dyslipidemia   . Headache(784.0): chronic 05/24/2013  . Heart palpitations   . Hypothyroidism   . Mitral valve prolapse   . MVP (mitral valve prolapse) 05/24/2013  . PAC (premature atrial contraction)   . Presence of permanent cardiac pacemaker 03/27/2018  . Spinal stenosis   . TMJ syndrome     Past Surgical History:  Procedure Laterality Date  . APPENDECTOMY    . BACK SURGERY    . BREAST BIOPSY  3 times  . BREAST EXCISIONAL BIOPSY Right 1970   benign  . BREAST EXCISIONAL BIOPSY Left 1950   2 benign  . BREAST EXCISIONAL BIOPSY Right 1950   benign  . CHOLECYSTECTOMY    . LOOP RECORDER INSERTION N/A  01/17/2018   Procedure: LOOP RECORDER INSERTION;  Surgeon: Laurie Lance, MD;  Location: [facility];  Service: Cardiovascular;  Laterality: N/A;  . LOOP RECORDER REMOVAL N/A 03/27/2018   Procedure: LOOP RECORDER REMOVAL;  Surgeon: Laurie Lance, MD;  Location: [facility];  Service: Cardiovascular;  Laterality: N/A;  . LUMBAR MICRODISCECTOMY    . PACEMAKER IMPLANT N/A 03/27/2018   Procedure: PACEMAKER IMPLANT;  Surgeon: Laurie Lance, MD;  Location: [facility];  Service: Cardiovascular;  Laterality: N/A;  . TONSILLECTOMY         HPI  from the history and physical done on the day of admission:   Patient coming from:  From [place] [facility]  Chief Complaint: fall HPI: Laurie Klein is a 84 y.o. female with medical history significant ofanxiety, dementia, DDD, hypothyroidism, spinal stenosis  Presented with a  Fall  Patient reports was sitting in her lazy chair and got up  the next thing she knew she fell. She is unsure if she passed out or not. She had similar symptoms in the past. She is unsure if she had true LOC. Patient is uncertain stating " I am not sure I guess I could have passed out"  [place] on her left side resulting in the left leg pain and hip pain she had prior falls last week she had possible syncope  and at that time   patient was seen in the emergency department  had work-up done and CT head was unremarkable.     Patient  has loop recorder in place since July given history of unexplained infrequent syncopal episodes/falls  in the past. Loop recorder was integrated yesterday how she was with cardiology office for follow-up and was noted that patient has episodes of bradycardia with heart rate going down to 30s. Plan for patient to have pacemaker done by cardiology in the near future. Today she felt lightheaded prior to the fall, no chest pain. No LOC no head injury. This time patient is being admitted for presumed recurrent syncope in the setting of  known bradycardia plan for pacemaker placement.  Reports occasional chest pain states currently feeling better. Slightly confused.        Hospital Course:   Laurie Klein Narrative  Laurie Klein is a 84 y.o. year old female with medical history significant for anxiety, dementia, hypothyroidism, and syncope s/p ILR implant who presented on 03/25/2018 with fall and was found to have symptomatic bradycardia.  Work-up to date troponin trend has been negative, Infection work-up unremarkable.  Since presenting after a fall x-ray in the ED of left tibia, left foot, ribs, lumbar spine, hip were negative for any acute fracture  She is followed by Dr. Lovena Le (cardiology) as outpatient.  Last seen on 03/24/18 with plan to undergo outpatient permanent pacemaker before this admission given concern for sinus node dysfunction based off arrhythmia findings.  Subjective Still having left ankle pain.  Assessment/Plan:  #Unwitnessed syncope and fall secondary to symptomatic sinus bradycardia.  S/p pacemaker insertion on 03/29/18 by cardiology, outpatient follow-up arranged-overall stable at this time  #Acute left calcaneus fracture-  due to symptomatic bradycardia-  Mild bruising on lateral ankle, painful with movement. Still good mobility and pulses. XR negative for fracture; however CT imaging on 03/28/18 showed acute fracture. Belt consulted ,  weightbearing as tolerated in fracture boot with physical therapy, follow-up with orthopedics in 3 weeks for clinical recheck  #Leukocytosis. Suspect stress leukocytosis related to fall, resolved. No localizing signs or symptoms, remained afebrile. Ua with moderate leukocytes but rare bacteria and negative nitrite not concerning for infection with no dysuria.  .  Again patient does not have UTI symptoms  #Dementia.  Alert and oriented x4.  Mental status at baseline currently.  Delirium precautions. PTA aricept  #Hypokalemia, resolved.  Status  post oral repletion on admission  #Hyperlipidemia, stable.  Continue Zocor  #Hypothyroidism, stable.  TSH noted,  Continue Synthroid   Code Status: DNR   Family Communication: no family at bedside   Disposition Plan:  [place] skilled nursing facility   Consultants:  Lbcardiology  Orthopedics  Procedures: Echo- 03/27/18-- EF 60 to 65 %  Discharge Condition: stable  Follow UP  Follow-up Information    [facility] Follow up on 04/06/2018.   Specialty:  Cardiology Why:  11:00AM, wound check visit Contact information: [address], [place]  Laurie Lance, MD Follow up on 07/03/2018.   Specialty:  Cardiology Why:  1:45PM Contact information: [address] [place] [address] [phone]        Laurie Pel, MD Follow up in 3 week(s).   Specialty:  Orthopedic Surgery Contact information: [place] [address] [phone]          Diet and Activity recommendation:  As advised  Discharge Instructions     Discharge Instructions    Call MD for:  difficulty breathing, headache or visual disturbances   Complete by:  As directed    Call MD for:  persistant dizziness or light-headedness   Complete by:  As directed    Call MD for:  persistant nausea and vomiting   Complete by:  As directed    Call MD for:  redness, tenderness, or signs of infection (pain, swelling, redness, odor or green/yellow discharge around incision site)   Complete by:  As directed    Call MD for:  severe uncontrolled pain   Complete by:  As directed    Call MD for:  temperature >100.4   Complete by:  As directed    Diet - low sodium heart healthy   Complete by:  As directed    Discharge instructions   Complete by:  As directed    1) weightbearing as tolerated in fracture boot with physical therapy today.   2)follow-up with orthopedics  in 3 weeks for clinical recheck.  3) Okay to not be in the fracture boot when she is in bed but when she is walking it would help her to get around to be in the fracture boot.   4) okay to weight-bear in the fracture boot.  5) left upper extremity restrictions as advised by cardiology team due to status post pacemaker placement 6) outpatient cardiology follow-up as advised by cardiology team   Increase activity slowly   Complete by:  As directed         Discharge Medications     Allergies as of 03/29/2018      Reactions   Acrylic Polymer Other (See Comments)   Blisters inside mouth    Lipitor [atorvastatin Calcium] Other (See Comments)   Myalgias and muscle aches    Orudis Kt Other (See Comments)   Unknown    Sulfa Antibiotics Itching   Amoxicillin Itching, Rash   Has patient had a PCN reaction causing immediate rash, facial/tongue/throat swelling, SOB or lightheadedness with hypotension: Yes Has patient had a PCN reaction causing severe rash involving mucus membranes or skin necrosis: No Has patient had a PCN reaction that required hospitalization: No Has patient had a PCN reaction occurring within the last 10 years: No If all of the above answers are "NO", then may proceed with Cephalosporin use.      Medication List    TAKE these medications   acetaminophen 325 MG tablet Commonly known as:  TYLENOL Take 2 tablets (650 mg total) by mouth every 4 (four) hours as needed for mild pain or fever.   cholecalciferol 1000 units tablet Commonly known as:  VITAMIN D Take 1,000 Units by mouth daily.   CITRACAL PO Take 1 tablet by mouth daily.   co-enzyme Q-10 30 MG capsule Take 100 mg by mouth daily.   donepezil 10 MG tablet Commonly known as:  ARICEPT Take 10 mg by mouth at bedtime.   fish oil-omega-3 fatty acids 1000 MG capsule Take 2 g by mouth  daily.   HYDROcodone-acetaminophen 5-325 MG tablet Commonly known as:  NORCO/VICODIN Take 1 tablet by mouth every 4 (four)  hours as needed for moderate pain.   multivitamin capsule Take 1 capsule by mouth daily.   polyethylene glycol packet Commonly known as:  MIRALAX / GLYCOLAX Take 17 g by mouth daily as needed for mild constipation.   simvastatin 20 MG tablet Commonly known as:  ZOCOR Take 20 mg by mouth every evening.   SYNTHROID 75 MCG tablet Generic drug:  levothyroxine Take 75 mcg by mouth daily.   VISINE 0.05 % ophthalmic solution Generic drug:  tetrahydrozoline Place 3 drops into both eyes daily as needed (For dry eyes).       Major procedures and Radiology Reports - PLEASE review detailed and final reports for all details, in brief -    Dg Chest 1 View  Result Date: 03/20/2018 CLINICAL DATA:  Syncope, cough. EXAM: CHEST  1 VIEW COMPARISON:  Radiographs of September 20, 2013. FINDINGS: The heart size and mediastinal contours are within normal limits. Both lungs are clear. No pneumothorax or pleural effusion is noted. The visualized skeletal structures are unremarkable. IMPRESSION: No acute cardiopulmonary abnormality seen. Electronically Signed   By: Marijo Conception, M.D.   On: 03/20/2018 09:56   Dg Chest 2 View  Result Date: 03/28/2018 CLINICAL DATA:  Status post pacemaker placement. Considerable pain this morning. EXAM: CHEST - 2 VIEW COMPARISON:  Pre pacemaker placement study of March 25, 2018 FINDINGS: The lungs are well-expanded. There is no pneumothorax or pleural effusion. The heart and pulmonary vascularity are normal. The pacemaker electrodes are in reasonable position radiographically. The generator projects over the lateral aspect of the left pectoral region. The bony thorax exhibits no acute abnormality. IMPRESSION: COPD. No CHF. No immediate postprocedure complication following permanent pacemaker placement. Electronically Signed   By: David  Martinique M.D.   On: 03/28/2018 10:19   Dg Ribs Unilateral W/chest Right  Result Date: 03/25/2018 CLINICAL DATA:  Fall, rib pain on the right  EXAM: RIGHT RIBS AND CHEST - 3+ VIEW COMPARISON:  03/20/2018 FINDINGS: Loop recorder device projects over the left chest. Heart is mildly enlarged. No confluent airspace opacities or effusions. No visible rib fracture. No pneumothorax. IMPRESSION: Cardiomegaly.  No active disease. No visible rib fracture. Electronically Signed   By: Rolm Baptise M.D.   On: 03/25/2018 18:57   Dg Lumbar Spine Complete  Result Date: 03/25/2018 CLINICAL DATA:  Fall. EXAM: LUMBAR SPINE - COMPLETE 4+ VIEW COMPARISON:  Abdomen and pelvis CT 09/24/2017 FINDINGS: L2 compression fracture present since prior CT of 09/24/2017 although there may be some or interval loss of vertebral body height. Sclerotic focus in the L1 vertebral body is stable. Degenerative disc disease at L3-4, L4-5, and L5-S1 is similar to prior. SI joints are unremarkable. Bones are diffusely demineralized. IMPRESSION: L2 compression fracture with probable loss of height since 09/24/2017. Degenerative disc disease in the lower lumbar spine without evidence of new fracture. Electronically Signed   By: Misty Stanley M.D.   On: 03/25/2018 18:59   Dg Tibia/fibula Left  Result Date: 03/25/2018 CLINICAL DATA:  Fall EXAM: LEFT TIBIA AND FIBULA - 2 VIEW COMPARISON:  None. FINDINGS: There is no evidence of fracture or other focal bone lesions. Soft tissues are unremarkable. IMPRESSION: Negative. Electronically Signed   By: Rolm Baptise M.D.   On: 03/25/2018 20:43   Ct Head Wo Contrast  Result Date: 03/20/2018 CLINICAL DATA:  Pain following fall EXAM: CT HEAD WITHOUT  CONTRAST CT CERVICAL SPINE WITHOUT CONTRAST TECHNIQUE: Multidetector CT imaging of the head and cervical spine was performed following the standard protocol without intravenous contrast. Multiplanar CT image reconstructions of the cervical spine were also generated. COMPARISON:  Head CT April 19, 2017 and brain MRI August 18, 2017 FINDINGS: CT HEAD FINDINGS Brain: There is moderate diffuse atrophy. There  is no intracranial mass, hemorrhage, extra-axial fluid collection, or midline shift. There is small vessel disease in the centra semiovale bilaterally. Small vessel disease is also noted in portions of the left external capsule. No new gray-white compartment lesions are appreciable. No acute infarct is evident. Vascular: No hyperdense vessel. There is calcification in each distal vertebral artery and carotid siphon region. Skull: The bony calvarium appears intact. Sinuses/Orbits: There is mucosal thickening in several ethmoid air cells bilaterally. Other visualized paranasal sinuses are clear. Orbits appear symmetric bilaterally. Other: Mastoid air cells are clear. There is debris in the right external auditory canal. CT CERVICAL SPINE FINDINGS Alignment: There is 2 mm of retrolisthesis of C3 on C4. No other spondylolisthesis evident. Skull base and vertebrae: Skull base and craniocervical junction regions appear unremarkable. Mild pannus posterior to the odontoid is not causing appreciable impression on the craniocervical junction. No fracture is appreciable. There are no blastic or lytic bone lesions. Soft tissues and spinal canal: Prevertebral soft tissues and predental space regions are normal. There is no paraspinous lesion. There are no evident cord or canal hematoma. Disc levels: There is severe disc space narrowing at C3-4. There is moderately severe disc space narrowing at C5-6. There is milder disc space narrowing at C4-5 and C6-7. There is facet hypertrophy at multiple levels, most notably at C3-4, C4-5, and C5-6 bilaterally. There is exit foraminal narrowing due to bony hypertrophy at C3-4 bilaterally and at C5-6 on the left. No frank disc extrusion or stenosis evident. Upper chest: Visualized upper lung zones are clear. Other: There is calcification focally in the left carotid artery. IMPRESSION: CT head: Atrophy with supratentorial small vessel disease. No acute infarct evident. No mass or hemorrhage.  There are foci of arterial vascular calcification. There is mucosal thickening in several ethmoid air cells. There is probable cerumen in the right external auditory canal. CT cervical spine: No fracture. Slight spondylolisthesis at C3-4 is felt to be due to underlying spondylosis. There is multilevel arthropathy. No frank disc extrusion or stenosis. Mild calcification in the left carotid artery noted. Electronically Signed   By: Lowella Grip III M.D.   On: 03/20/2018 09:58   Ct Cervical Spine Wo Contrast  Result Date: 03/20/2018 CLINICAL DATA:  Pain following fall EXAM: CT HEAD WITHOUT CONTRAST CT CERVICAL SPINE WITHOUT CONTRAST TECHNIQUE: Multidetector CT imaging of the head and cervical spine was performed following the standard protocol without intravenous contrast. Multiplanar CT image reconstructions of the cervical spine were also generated. COMPARISON:  Head CT April 19, 2017 and brain MRI August 18, 2017 FINDINGS: CT HEAD FINDINGS Brain: There is moderate diffuse atrophy. There is no intracranial mass, hemorrhage, extra-axial fluid collection, or midline shift. There is small vessel disease in the centra semiovale bilaterally. Small vessel disease is also noted in portions of the left external capsule. No new gray-white compartment lesions are appreciable. No acute infarct is evident. Vascular: No hyperdense vessel. There is calcification in each distal vertebral artery and carotid siphon region. Skull: The bony calvarium appears intact. Sinuses/Orbits: There is mucosal thickening in several ethmoid air cells bilaterally. Other visualized paranasal sinuses are clear. Orbits appear symmetric  bilaterally. Other: Mastoid air cells are clear. There is debris in the right external auditory canal. CT CERVICAL SPINE FINDINGS Alignment: There is 2 mm of retrolisthesis of C3 on C4. No other spondylolisthesis evident. Skull base and vertebrae: Skull base and craniocervical junction regions appear  unremarkable. Mild pannus posterior to the odontoid is not causing appreciable impression on the craniocervical junction. No fracture is appreciable. There are no blastic or lytic bone lesions. Soft tissues and spinal canal: Prevertebral soft tissues and predental space regions are normal. There is no paraspinous lesion. There are no evident cord or canal hematoma. Disc levels: There is severe disc space narrowing at C3-4. There is moderately severe disc space narrowing at C5-6. There is milder disc space narrowing at C4-5 and C6-7. There is facet hypertrophy at multiple levels, most notably at C3-4, C4-5, and C5-6 bilaterally. There is exit foraminal narrowing due to bony hypertrophy at C3-4 bilaterally and at C5-6 on the left. No frank disc extrusion or stenosis evident. Upper chest: Visualized upper lung zones are clear. Other: There is calcification focally in the left carotid artery. IMPRESSION: CT head: Atrophy with supratentorial small vessel disease. No acute infarct evident. No mass or hemorrhage. There are foci of arterial vascular calcification. There is mucosal thickening in several ethmoid air cells. There is probable cerumen in the right external auditory canal. CT cervical spine: No fracture. Slight spondylolisthesis at C3-4 is felt to be due to underlying spondylosis. There is multilevel arthropathy. No frank disc extrusion or stenosis. Mild calcification in the left carotid artery noted. Electronically Signed   By: Lowella Grip III M.D.   On: 03/20/2018 09:58   Ct Ankle Left Wo Contrast  Result Date: 03/28/2018 CLINICAL DATA:  Persistent left ankle pain, query fracture. EXAM: CT OF THE LEFT ANKLE WITHOUT CONTRAST TECHNIQUE: Multidetector CT imaging of the left ankle was performed according to the standard protocol. Multiplanar CT image reconstructions were also generated. COMPARISON:  Foot radiographs 03/25/2018 FINDINGS: Bones/Joint/Cartilage A fracture of the anterolateral process of the  calcaneus is noted, series 3/96 and series 8/images 36 and 37 with overlying soft tissue swelling is noted. No joint dislocation or suspicious osseous lesions. The Lisfranc articulation and midfoot appear intact. The tibiotalar and subtalar joints are nonacute. Subchondral degenerative cystic change of the talar dome across the ankle joint consistent with osteoarthritis. Dorsal calcaneal enthesopathy is seen. Ligaments Suboptimally assessed by CT. Muscles and Tendons Noncontributory Soft tissues Soft tissue swelling about the malleoli and dorsum of the included mid and forefoot. IMPRESSION: 1. Acute appearing anterolateral process fracture of the calcaneus with overlying soft tissue swelling. 2. Osteoarthritis of the tibiotalar joint. 3. Calcaneal enthesopathy. Electronically Signed   By: Ashley Royalty M.D.   On: 03/28/2018 18:51   Dg Foot Complete Left  Result Date: 03/25/2018 CLINICAL DATA:  Fall, foot bruising, pain EXAM: LEFT FOOT - COMPLETE 3+ VIEW COMPARISON:  None. FINDINGS: No acute bony abnormality. Specifically, no fracture, subluxation, or dislocation. Joint spaces maintained. Soft tissues intact. IMPRESSION: No acute bony abnormality. Electronically Signed   By: Rolm Baptise M.D.   On: 03/25/2018 20:43   Dg Hip Unilat W Or Wo Pelvis 2-3 Views Right  Result Date: 03/25/2018 CLINICAL DATA:  Right hip pain following a fall today. EXAM: DG HIP (WITH OR WITHOUT PELVIS) 2-3V RIGHT COMPARISON:  None. FINDINGS: Normal appearing right hip without fracture or dislocation. Lower lumbar spine degenerative changes. IMPRESSION: No fracture or dislocation. Lower lumbar spine degenerative changes. Electronically Signed   By:  Claudie Revering M.D.   On: 03/25/2018 18:58    Micro Results    Recent Results (from the past 240 hour(s))  Surgical PCR screen     Status: None   Collection Time: 03/27/18 11:04 AM  Result Value Ref Range Status   MRSA, PCR NEGATIVE NEGATIVE Final   Staphylococcus aureus NEGATIVE  NEGATIVE Final    Comment: (NOTE) The Xpert SA Assay (FDA approved for NASAL specimens in patients 44 years of age and older), is one component of a comprehensive surveillance program. It is not intended to diagnose infection nor to guide or monitor treatment. Performed at [facility], [place] [address] [place], [place] [address]    Today   Subjective    Allesandra Lipton today has no new concerns, resting comfortably, no cp, no sob          Patient has been seen and examined prior to discharge   Objective   Blood pressure (!) 109/58, pulse 67, temperature 98.5 F (36.9 C), temperature source Oral, resp. rate 12, height 5\' 4"  (1.626 m), weight 57.1 kg, SpO2 96 %.   Intake/Output Summary (Last 24 hours) at 03/29/2018 1248 Last data filed at 03/29/2018 1100 Gross per 24 hour  Intake 1040 ml  Output 300 ml  Net 740 ml    Exam Gen:- Awake Alert, in no apparent distress  HEENT:- [place].AT, No sclera icterus Neck-Supple Neck,No JVD,.  Lungs-  CTAB , good symmetrical air movement CV- S1, S2 normal , left subclavian area pacemaker in situ Abd-  +ve B.Sounds, Abd Soft, No tenderness,    Extremity/Skin:-Good pulses,   warm and dry Psych-affect is appropriate, oriented x3 Neuro-no new focal deficits, no tremors MSK-left foot and ankle in boot/brace  Data Review   CBC w Diff:  Lab Results  Component Value Date   WBC 10.5 03/28/2018   HGB 12.8 03/28/2018   HGB 13.3 03/24/2018   HCT 38.3 03/28/2018   HCT 39.9 03/24/2018   PLT 268 03/28/2018   PLT 333 03/24/2018   LYMPHOPCT 28 03/25/2018   MONOPCT 6 03/25/2018   EOSPCT 0 03/25/2018   BASOPCT 1 03/25/2018    CMP:  Lab Results  Component Value Date   NA 138 03/26/2018   NA 138 03/24/2018   K 4.9 03/26/2018   CL 104 03/26/2018   CO2 25 03/26/2018   BUN 14 03/26/2018   BUN 12 03/24/2018   CREATININE 0.76 03/26/2018   PROT 5.8 (L) 03/26/2018   ALBUMIN 3.2 (L) 03/26/2018   BILITOT 0.6 03/26/2018   ALKPHOS 77 03/26/2018     AST 136 (H) 03/26/2018   ALT 76 (H) 03/26/2018  .   Total Discharge time is about 33 minutes  Roxan Hockey M.D on 03/29/2018 at 12:48 PM  Pager---[phone]  Go to [email or web address] - password [email or web address] for contact info  [facility] - Office  [phone]

## 2018-03-30 DIAGNOSIS — F0390 Unspecified dementia without behavioral disturbance: Secondary | ICD-10-CM | POA: Diagnosis not present

## 2018-03-30 DIAGNOSIS — M6281 Muscle weakness (generalized): Secondary | ICD-10-CM | POA: Diagnosis not present

## 2018-03-30 DIAGNOSIS — R41841 Cognitive communication deficit: Secondary | ICD-10-CM | POA: Diagnosis not present

## 2018-03-30 DIAGNOSIS — R262 Difficulty in walking, not elsewhere classified: Secondary | ICD-10-CM | POA: Diagnosis not present

## 2018-03-30 DIAGNOSIS — Z950 Presence of cardiac pacemaker: Secondary | ICD-10-CM | POA: Diagnosis not present

## 2018-03-31 DIAGNOSIS — F0150 Vascular dementia without behavioral disturbance: Secondary | ICD-10-CM | POA: Diagnosis not present

## 2018-03-31 DIAGNOSIS — F0390 Unspecified dementia without behavioral disturbance: Secondary | ICD-10-CM | POA: Diagnosis not present

## 2018-03-31 DIAGNOSIS — R262 Difficulty in walking, not elsewhere classified: Secondary | ICD-10-CM | POA: Diagnosis not present

## 2018-03-31 DIAGNOSIS — R41841 Cognitive communication deficit: Secondary | ICD-10-CM | POA: Diagnosis not present

## 2018-03-31 DIAGNOSIS — M6281 Muscle weakness (generalized): Secondary | ICD-10-CM | POA: Diagnosis not present

## 2018-03-31 DIAGNOSIS — E039 Hypothyroidism, unspecified: Secondary | ICD-10-CM | POA: Diagnosis not present

## 2018-03-31 DIAGNOSIS — D72828 Other elevated white blood cell count: Secondary | ICD-10-CM | POA: Diagnosis not present

## 2018-03-31 DIAGNOSIS — E785 Hyperlipidemia, unspecified: Secondary | ICD-10-CM | POA: Diagnosis not present

## 2018-03-31 DIAGNOSIS — K5900 Constipation, unspecified: Secondary | ICD-10-CM | POA: Diagnosis not present

## 2018-03-31 DIAGNOSIS — Z950 Presence of cardiac pacemaker: Secondary | ICD-10-CM | POA: Diagnosis not present

## 2018-03-31 DIAGNOSIS — H04129 Dry eye syndrome of unspecified lacrimal gland: Secondary | ICD-10-CM | POA: Diagnosis not present

## 2018-03-31 DIAGNOSIS — E559 Vitamin D deficiency, unspecified: Secondary | ICD-10-CM | POA: Diagnosis not present

## 2018-03-31 DIAGNOSIS — S92002A Unspecified fracture of left calcaneus, initial encounter for closed fracture: Secondary | ICD-10-CM | POA: Diagnosis not present

## 2018-04-03 DIAGNOSIS — K5900 Constipation, unspecified: Secondary | ICD-10-CM | POA: Diagnosis not present

## 2018-04-03 DIAGNOSIS — E039 Hypothyroidism, unspecified: Secondary | ICD-10-CM | POA: Diagnosis not present

## 2018-04-03 DIAGNOSIS — Z950 Presence of cardiac pacemaker: Secondary | ICD-10-CM | POA: Diagnosis not present

## 2018-04-03 DIAGNOSIS — M6281 Muscle weakness (generalized): Secondary | ICD-10-CM | POA: Diagnosis not present

## 2018-04-03 DIAGNOSIS — F0390 Unspecified dementia without behavioral disturbance: Secondary | ICD-10-CM | POA: Diagnosis not present

## 2018-04-03 DIAGNOSIS — E785 Hyperlipidemia, unspecified: Secondary | ICD-10-CM | POA: Diagnosis not present

## 2018-04-03 DIAGNOSIS — R630 Anorexia: Secondary | ICD-10-CM | POA: Diagnosis not present

## 2018-04-03 DIAGNOSIS — R262 Difficulty in walking, not elsewhere classified: Secondary | ICD-10-CM | POA: Diagnosis not present

## 2018-04-03 DIAGNOSIS — F0150 Vascular dementia without behavioral disturbance: Secondary | ICD-10-CM | POA: Diagnosis not present

## 2018-04-03 DIAGNOSIS — R41841 Cognitive communication deficit: Secondary | ICD-10-CM | POA: Diagnosis not present

## 2018-04-03 DIAGNOSIS — E559 Vitamin D deficiency, unspecified: Secondary | ICD-10-CM | POA: Diagnosis not present

## 2018-04-03 DIAGNOSIS — D72828 Other elevated white blood cell count: Secondary | ICD-10-CM | POA: Diagnosis not present

## 2018-04-03 DIAGNOSIS — H04129 Dry eye syndrome of unspecified lacrimal gland: Secondary | ICD-10-CM | POA: Diagnosis not present

## 2018-04-03 DIAGNOSIS — S92002A Unspecified fracture of left calcaneus, initial encounter for closed fracture: Secondary | ICD-10-CM | POA: Diagnosis not present

## 2018-04-03 LAB — CUP PACEART REMOTE DEVICE CHECK
Implantable Pulse Generator Implant Date: 20190716
MDC IDC SESS DTM: 20190920180540

## 2018-04-04 ENCOUNTER — Ambulatory Visit: Payer: Medicare Other | Admitting: Internal Medicine

## 2018-04-04 DIAGNOSIS — R262 Difficulty in walking, not elsewhere classified: Secondary | ICD-10-CM | POA: Diagnosis not present

## 2018-04-04 DIAGNOSIS — M6281 Muscle weakness (generalized): Secondary | ICD-10-CM | POA: Diagnosis not present

## 2018-04-04 DIAGNOSIS — Z950 Presence of cardiac pacemaker: Secondary | ICD-10-CM | POA: Diagnosis not present

## 2018-04-04 DIAGNOSIS — R41841 Cognitive communication deficit: Secondary | ICD-10-CM | POA: Diagnosis not present

## 2018-04-04 DIAGNOSIS — F0390 Unspecified dementia without behavioral disturbance: Secondary | ICD-10-CM | POA: Diagnosis not present

## 2018-04-05 ENCOUNTER — Telehealth: Payer: Self-pay | Admitting: Cardiology

## 2018-04-05 DIAGNOSIS — S92002A Unspecified fracture of left calcaneus, initial encounter for closed fracture: Secondary | ICD-10-CM | POA: Diagnosis not present

## 2018-04-05 DIAGNOSIS — M6281 Muscle weakness (generalized): Secondary | ICD-10-CM | POA: Diagnosis not present

## 2018-04-05 DIAGNOSIS — R262 Difficulty in walking, not elsewhere classified: Secondary | ICD-10-CM | POA: Diagnosis not present

## 2018-04-05 DIAGNOSIS — F0390 Unspecified dementia without behavioral disturbance: Secondary | ICD-10-CM | POA: Diagnosis not present

## 2018-04-05 DIAGNOSIS — R41841 Cognitive communication deficit: Secondary | ICD-10-CM | POA: Diagnosis not present

## 2018-04-05 DIAGNOSIS — M25571 Pain in right ankle and joints of right foot: Secondary | ICD-10-CM | POA: Diagnosis not present

## 2018-04-05 DIAGNOSIS — Z950 Presence of cardiac pacemaker: Secondary | ICD-10-CM | POA: Diagnosis not present

## 2018-04-05 DIAGNOSIS — R2689 Other abnormalities of gait and mobility: Secondary | ICD-10-CM | POA: Diagnosis not present

## 2018-04-05 DIAGNOSIS — S92351A Displaced fracture of fifth metatarsal bone, right foot, initial encounter for closed fracture: Secondary | ICD-10-CM | POA: Diagnosis not present

## 2018-04-05 NOTE — Telephone Encounter (Signed)
Spoke w/ pt son and requested that she send a manual transmission b/c her home monitor has not updated in at least 14 days. Pt son informed me that pt was implanted with a PPM and no longer had the loop recorder.

## 2018-04-06 ENCOUNTER — Ambulatory Visit (INDEPENDENT_AMBULATORY_CARE_PROVIDER_SITE_OTHER): Payer: Medicare Other | Admitting: *Deleted

## 2018-04-06 ENCOUNTER — Encounter: Payer: Self-pay | Admitting: *Deleted

## 2018-04-06 DIAGNOSIS — R55 Syncope and collapse: Secondary | ICD-10-CM

## 2018-04-06 DIAGNOSIS — K5900 Constipation, unspecified: Secondary | ICD-10-CM | POA: Diagnosis not present

## 2018-04-06 DIAGNOSIS — E785 Hyperlipidemia, unspecified: Secondary | ICD-10-CM | POA: Diagnosis not present

## 2018-04-06 DIAGNOSIS — H04129 Dry eye syndrome of unspecified lacrimal gland: Secondary | ICD-10-CM | POA: Diagnosis not present

## 2018-04-06 DIAGNOSIS — F0390 Unspecified dementia without behavioral disturbance: Secondary | ICD-10-CM | POA: Diagnosis not present

## 2018-04-06 DIAGNOSIS — R262 Difficulty in walking, not elsewhere classified: Secondary | ICD-10-CM | POA: Diagnosis not present

## 2018-04-06 DIAGNOSIS — D72828 Other elevated white blood cell count: Secondary | ICD-10-CM | POA: Diagnosis not present

## 2018-04-06 DIAGNOSIS — I495 Sick sinus syndrome: Secondary | ICD-10-CM | POA: Diagnosis not present

## 2018-04-06 DIAGNOSIS — E559 Vitamin D deficiency, unspecified: Secondary | ICD-10-CM | POA: Diagnosis not present

## 2018-04-06 DIAGNOSIS — D649 Anemia, unspecified: Secondary | ICD-10-CM | POA: Diagnosis not present

## 2018-04-06 DIAGNOSIS — R41841 Cognitive communication deficit: Secondary | ICD-10-CM | POA: Diagnosis not present

## 2018-04-06 DIAGNOSIS — E039 Hypothyroidism, unspecified: Secondary | ICD-10-CM | POA: Diagnosis not present

## 2018-04-06 DIAGNOSIS — Z950 Presence of cardiac pacemaker: Secondary | ICD-10-CM

## 2018-04-06 DIAGNOSIS — S92301A Fracture of unspecified metatarsal bone(s), right foot, initial encounter for closed fracture: Secondary | ICD-10-CM | POA: Diagnosis not present

## 2018-04-06 DIAGNOSIS — M6281 Muscle weakness (generalized): Secondary | ICD-10-CM | POA: Diagnosis not present

## 2018-04-06 DIAGNOSIS — F0150 Vascular dementia without behavioral disturbance: Secondary | ICD-10-CM | POA: Diagnosis not present

## 2018-04-06 DIAGNOSIS — S92002A Unspecified fracture of left calcaneus, initial encounter for closed fracture: Secondary | ICD-10-CM | POA: Diagnosis not present

## 2018-04-06 DIAGNOSIS — G501 Atypical facial pain: Secondary | ICD-10-CM | POA: Diagnosis not present

## 2018-04-06 LAB — CUP PACEART INCLINIC DEVICE CHECK
Brady Statistic RA Percent Paced: 63 %
Brady Statistic RV Percent Paced: 0 %
Date Time Interrogation Session: 20191003120513
Implantable Lead Implant Date: 20190923
Implantable Lead Location: 753860
Implantable Lead Model: 377
Implantable Lead Model: 377
Implantable Lead Serial Number: 80830530
Lead Channel Impedance Value: 507 Ohm
Lead Channel Impedance Value: 663 Ohm
Lead Channel Pacing Threshold Pulse Width: 0.4 ms
Lead Channel Sensing Intrinsic Amplitude: 3.1 mV
Lead Channel Setting Pacing Amplitude: 3 V
MDC IDC LEAD IMPLANT DT: 20190923
MDC IDC LEAD LOCATION: 753859
MDC IDC LEAD SERIAL: 80773242
MDC IDC MSMT LEADCHNL RA PACING THRESHOLD AMPLITUDE: 1.3 V
MDC IDC MSMT LEADCHNL RV PACING THRESHOLD AMPLITUDE: 0.6 V
MDC IDC MSMT LEADCHNL RV PACING THRESHOLD PULSEWIDTH: 0.4 ms
MDC IDC MSMT LEADCHNL RV SENSING INTR AMPL: 7.4 mV
MDC IDC PG IMPLANT DT: 20190923
MDC IDC SET LEADCHNL RA PACING AMPLITUDE: 3 V
MDC IDC SET LEADCHNL RV PACING PULSEWIDTH: 0.4 ms
Pulse Gen Serial Number: 69439851

## 2018-04-06 NOTE — Progress Notes (Signed)
Wound check appointment. Steri-strips removed from PPM implant site and ILR explant site. Wounds without redness or edema. Incision edges approximated, wounds well healed. Normal device function. Thresholds, sensing, and impedances consistent with implant measurements. Device programmed at 3.0V for extra safety margin until 3 month visit. Histogram distribution appropriate for patient and level of activity. No mode switches or high ventricular rates noted. Patient and son educated about wound care, arm mobility, lifting restrictions, and remote monitor. Industry rep to order new monitor to be sent to patient at facility as patient did not leave hospital with her monitor. ROV with GT on 07/03/18.

## 2018-04-07 DIAGNOSIS — R41841 Cognitive communication deficit: Secondary | ICD-10-CM | POA: Diagnosis not present

## 2018-04-07 DIAGNOSIS — M6281 Muscle weakness (generalized): Secondary | ICD-10-CM | POA: Diagnosis not present

## 2018-04-07 DIAGNOSIS — F0390 Unspecified dementia without behavioral disturbance: Secondary | ICD-10-CM | POA: Diagnosis not present

## 2018-04-07 DIAGNOSIS — R262 Difficulty in walking, not elsewhere classified: Secondary | ICD-10-CM | POA: Diagnosis not present

## 2018-04-07 DIAGNOSIS — Z950 Presence of cardiac pacemaker: Secondary | ICD-10-CM | POA: Diagnosis not present

## 2018-04-10 DIAGNOSIS — S92301A Fracture of unspecified metatarsal bone(s), right foot, initial encounter for closed fracture: Secondary | ICD-10-CM | POA: Diagnosis not present

## 2018-04-10 DIAGNOSIS — E785 Hyperlipidemia, unspecified: Secondary | ICD-10-CM | POA: Diagnosis not present

## 2018-04-10 DIAGNOSIS — Z950 Presence of cardiac pacemaker: Secondary | ICD-10-CM | POA: Diagnosis not present

## 2018-04-10 DIAGNOSIS — S92002A Unspecified fracture of left calcaneus, initial encounter for closed fracture: Secondary | ICD-10-CM | POA: Diagnosis not present

## 2018-04-10 DIAGNOSIS — M6281 Muscle weakness (generalized): Secondary | ICD-10-CM | POA: Diagnosis not present

## 2018-04-10 DIAGNOSIS — R262 Difficulty in walking, not elsewhere classified: Secondary | ICD-10-CM | POA: Diagnosis not present

## 2018-04-10 DIAGNOSIS — F0390 Unspecified dementia without behavioral disturbance: Secondary | ICD-10-CM | POA: Diagnosis not present

## 2018-04-10 DIAGNOSIS — H04129 Dry eye syndrome of unspecified lacrimal gland: Secondary | ICD-10-CM | POA: Diagnosis not present

## 2018-04-10 DIAGNOSIS — E559 Vitamin D deficiency, unspecified: Secondary | ICD-10-CM | POA: Diagnosis not present

## 2018-04-10 DIAGNOSIS — E039 Hypothyroidism, unspecified: Secondary | ICD-10-CM | POA: Diagnosis not present

## 2018-04-10 DIAGNOSIS — R41841 Cognitive communication deficit: Secondary | ICD-10-CM | POA: Diagnosis not present

## 2018-04-10 DIAGNOSIS — K5900 Constipation, unspecified: Secondary | ICD-10-CM | POA: Diagnosis not present

## 2018-04-10 DIAGNOSIS — F0150 Vascular dementia without behavioral disturbance: Secondary | ICD-10-CM | POA: Diagnosis not present

## 2018-04-10 DIAGNOSIS — D72828 Other elevated white blood cell count: Secondary | ICD-10-CM | POA: Diagnosis not present

## 2018-04-10 DIAGNOSIS — R55 Syncope and collapse: Secondary | ICD-10-CM | POA: Diagnosis not present

## 2018-04-11 DIAGNOSIS — R262 Difficulty in walking, not elsewhere classified: Secondary | ICD-10-CM | POA: Diagnosis not present

## 2018-04-11 DIAGNOSIS — R41841 Cognitive communication deficit: Secondary | ICD-10-CM | POA: Diagnosis not present

## 2018-04-11 DIAGNOSIS — M6281 Muscle weakness (generalized): Secondary | ICD-10-CM | POA: Diagnosis not present

## 2018-04-11 DIAGNOSIS — Z950 Presence of cardiac pacemaker: Secondary | ICD-10-CM | POA: Diagnosis not present

## 2018-04-11 DIAGNOSIS — F0390 Unspecified dementia without behavioral disturbance: Secondary | ICD-10-CM | POA: Diagnosis not present

## 2018-04-12 ENCOUNTER — Ambulatory Visit (INDEPENDENT_AMBULATORY_CARE_PROVIDER_SITE_OTHER): Payer: Medicare Other

## 2018-04-12 ENCOUNTER — Encounter (INDEPENDENT_AMBULATORY_CARE_PROVIDER_SITE_OTHER): Payer: Self-pay | Admitting: Orthopedic Surgery

## 2018-04-12 ENCOUNTER — Ambulatory Visit (INDEPENDENT_AMBULATORY_CARE_PROVIDER_SITE_OTHER): Payer: Medicare Other | Admitting: Orthopedic Surgery

## 2018-04-12 DIAGNOSIS — R55 Syncope and collapse: Secondary | ICD-10-CM | POA: Diagnosis not present

## 2018-04-12 DIAGNOSIS — M6281 Muscle weakness (generalized): Secondary | ICD-10-CM | POA: Diagnosis not present

## 2018-04-12 DIAGNOSIS — E039 Hypothyroidism, unspecified: Secondary | ICD-10-CM | POA: Diagnosis not present

## 2018-04-12 DIAGNOSIS — E785 Hyperlipidemia, unspecified: Secondary | ICD-10-CM | POA: Diagnosis not present

## 2018-04-12 DIAGNOSIS — K5900 Constipation, unspecified: Secondary | ICD-10-CM | POA: Diagnosis not present

## 2018-04-12 DIAGNOSIS — M25572 Pain in left ankle and joints of left foot: Secondary | ICD-10-CM | POA: Diagnosis not present

## 2018-04-12 DIAGNOSIS — Z950 Presence of cardiac pacemaker: Secondary | ICD-10-CM | POA: Diagnosis not present

## 2018-04-12 DIAGNOSIS — R262 Difficulty in walking, not elsewhere classified: Secondary | ICD-10-CM | POA: Diagnosis not present

## 2018-04-12 DIAGNOSIS — H04129 Dry eye syndrome of unspecified lacrimal gland: Secondary | ICD-10-CM | POA: Diagnosis not present

## 2018-04-12 DIAGNOSIS — R41841 Cognitive communication deficit: Secondary | ICD-10-CM | POA: Diagnosis not present

## 2018-04-12 DIAGNOSIS — S92301A Fracture of unspecified metatarsal bone(s), right foot, initial encounter for closed fracture: Secondary | ICD-10-CM | POA: Diagnosis not present

## 2018-04-12 DIAGNOSIS — F0150 Vascular dementia without behavioral disturbance: Secondary | ICD-10-CM | POA: Diagnosis not present

## 2018-04-12 DIAGNOSIS — D72828 Other elevated white blood cell count: Secondary | ICD-10-CM | POA: Diagnosis not present

## 2018-04-12 DIAGNOSIS — S92002A Unspecified fracture of left calcaneus, initial encounter for closed fracture: Secondary | ICD-10-CM | POA: Diagnosis not present

## 2018-04-12 DIAGNOSIS — F0390 Unspecified dementia without behavioral disturbance: Secondary | ICD-10-CM | POA: Diagnosis not present

## 2018-04-12 DIAGNOSIS — E559 Vitamin D deficiency, unspecified: Secondary | ICD-10-CM | POA: Diagnosis not present

## 2018-04-12 NOTE — Progress Notes (Signed)
Office Visit Note   Patient: Laurie Klein           Date of Birth: [birth year]           MRN: [id number] Visit Date: 04/12/2018 Requested by: Lajean Manes, MD [address] [place], [place] [address] PCP: Lajean Manes, MD  Subjective: Chief Complaint  Patient presents with  . Left Foot - Follow-up  . Left Ankle - Follow-up    HPI: Laurie Klein is a patient with left foot and ankle pain.  Date of injury 03/25/2018.  She is been in a fracture boot weightbearing.  Reports some soreness.  She is able to ambulate within her room.              ROS: All systems reviewed are negative as they relate to the chief complaint within the history of present illness.  Patient denies  fevers or chills.   Assessment & Plan: Visit Diagnoses:  1. Pain in left ankle and joints of left foot     Plan: Impression is avulsion fractures left foot without any increase in displacement or change in alignment on plain radiographs today.  I would like to get her out of the fracture boot this weekend and let her start walking in regular shoes.  I will see her back as needed.  Follow-Up Instructions: Return if symptoms worsen or fail to improve.   Orders:  Orders Placed This Encounter  Procedures  . XR Ankle Complete Left  . XR Foot Complete Left   No orders of the defined types were placed in this encounter.     Procedures: No procedures performed   Clinical Data: No additional findings.  Objective: Vital Signs: There were no vitals taken for this visit.  Physical Exam:   Constitutional: Patient appears well-developed HEENT:  Head: Normocephalic Eyes:EOM are normal Neck: Normal range of motion Cardiovascular: Normal rate Pulmonary/chest: Effort normal Neurologic: Patient is alert Skin: Skin is warm Psychiatric: Patient has normal mood and affect    Ortho Exam: Ortho exam demonstrates mild tenderness with pronation supination of that left foot.  She has intact and nontender  anterior to posterior to peroneal and Achilles tendon.  Some venous stasis is present but no acute swelling is present.  Ankle range of motion otherwise normal and symmetric with the right foot and ankle.  Right foot and ankle is nontender to palpation.  Specialty Comments:  No specialty comments available.  Imaging: Xr Ankle Complete Left  Result Date: 04/12/2018 AP lateral mortise left ankle reviewed.  No definite fracture or dislocation is present.  Minimal degenerative changes noted.  Xr Foot Complete Left  Result Date: 04/12/2018 AP lateral oblique left foot reviewed.  No definite fracture or dislocation is seen.  Small avulsion fracture present of the talus.  This is unchanged in appearance compared to the CT scan from September.    PMFS History: Patient Active Problem List   Diagnosis Date Noted  . Closed left ankle fracture 03/28/2018  . Leukocytosis 03/27/2018  . Symptomatic bradycardia 03/25/2018  . Dementia ([place]) 03/25/2018  . Contusion of right periocular region   . Great toe pain, left   . Syncope 04/19/2017  . Syncope and collapse 05/24/2013  . MVP (mitral valve prolapse) 05/24/2013  . Anxiety 05/24/2013  . Dyslipidemia 05/24/2013  . Hypothyroidism 05/24/2013  . Headache(784.0): chronic 05/24/2013  . Spinal stenosis 05/24/2013  . PAC (premature atrial contraction) 05/24/2013  . DDD (degenerative disc disease) 05/24/2013   Past Medical  History:  Diagnosis Date  . Anxiety   . Chronic headaches    history of  . DDD (degenerative disc disease) 05/24/2013  . Degenerative disc disease   . Dyslipidemia   . Headache(784.0): chronic 05/24/2013  . Heart palpitations   . Hypothyroidism   . Mitral valve prolapse   . MVP (mitral valve prolapse) 05/24/2013  . PAC (premature atrial contraction)   . Presence of permanent cardiac pacemaker 03/27/2018  . Spinal stenosis   . TMJ syndrome     Family History  Problem Relation Age of Onset  . Heart failure Mother 89    . Stroke Mother   . Heart disease Father   . Melanoma Child     Past Surgical History:  Procedure Laterality Date  . APPENDECTOMY    . BACK SURGERY    . BREAST BIOPSY  3 times  . BREAST EXCISIONAL BIOPSY Right 1970   benign  . BREAST EXCISIONAL BIOPSY Left 1950   2 benign  . BREAST EXCISIONAL BIOPSY Right 1950   benign  . CHOLECYSTECTOMY    . LOOP RECORDER INSERTION N/A 01/17/2018   Procedure: LOOP RECORDER INSERTION;  Surgeon: Evans Lance, MD;  Location: [facility];  Service: Cardiovascular;  Laterality: N/A;  . LOOP RECORDER REMOVAL N/A 03/27/2018   Procedure: LOOP RECORDER REMOVAL;  Surgeon: Evans Lance, MD;  Location: [facility];  Service: Cardiovascular;  Laterality: N/A;  . LUMBAR MICRODISCECTOMY    . PACEMAKER IMPLANT N/A 03/27/2018   Procedure: PACEMAKER IMPLANT;  Surgeon: Evans Lance, MD;  Location: [facility];  Service: Cardiovascular;  Laterality: N/A;  . TONSILLECTOMY     Social History   Occupational History  . Occupation: retired Network engineer  Tobacco Use  . Smoking status: Former Smoker    Packs/day: 0.50    Types: Cigarettes    Last attempt to quit: 10/03/1973    Years since quitting: 44.5  . Smokeless tobacco: Never Used  Substance and Sexual Activity  . Alcohol use: Yes    Alcohol/week: 3.0 standard drinks    Types: 3 Standard drinks or equivalent per week    Comment: 4 glasses wine per week  . Drug use: No  . Sexual activity: Not on file

## 2018-04-13 ENCOUNTER — Telehealth (INDEPENDENT_AMBULATORY_CARE_PROVIDER_SITE_OTHER): Payer: Self-pay | Admitting: Orthopedic Surgery

## 2018-04-13 DIAGNOSIS — F0390 Unspecified dementia without behavioral disturbance: Secondary | ICD-10-CM | POA: Diagnosis not present

## 2018-04-13 DIAGNOSIS — R262 Difficulty in walking, not elsewhere classified: Secondary | ICD-10-CM | POA: Diagnosis not present

## 2018-04-13 DIAGNOSIS — M6281 Muscle weakness (generalized): Secondary | ICD-10-CM | POA: Diagnosis not present

## 2018-04-13 DIAGNOSIS — Z950 Presence of cardiac pacemaker: Secondary | ICD-10-CM | POA: Diagnosis not present

## 2018-04-13 DIAGNOSIS — R41841 Cognitive communication deficit: Secondary | ICD-10-CM | POA: Diagnosis not present

## 2018-04-13 DIAGNOSIS — E559 Vitamin D deficiency, unspecified: Secondary | ICD-10-CM | POA: Diagnosis not present

## 2018-04-13 NOTE — Telephone Encounter (Signed)
[facility] Manager  Clarification on appointment [phone] [facility] Nurse Manager    [name] was seen yesterday for pain in left ankle and joints of left foot. [name] wanted to know if Dr.Dean looked at both feet or just her left foot. There was a x-ray sent with patient yesterday,for a x-ray of the right foot. If Dr.Dean looked at patients right foot they would like to know her weight bearing status.

## 2018-04-13 NOTE — Telephone Encounter (Signed)
For the diagnosis on the x-ray report she should be okay to be weightbearing as to all rated in the fracture boot on the right-hand side.  She does not really need the fracture boot on the left-hand side anymore based on her exam today.  She should use that fracture boot for about 2 weeks and then I think she should be okay without anything particularly since she is not symptomatic on that right side

## 2018-04-13 NOTE — Telephone Encounter (Signed)
IC advised per Dr Marlou Sa

## 2018-04-13 NOTE — Telephone Encounter (Signed)
IC s/w Laurie Klein and advised only left foot/ankle was addressed at appointment yesterday. Advised her we were under impression that it was follow up from hospital for her left foot/ankle.  Also advised her that I had questioned patient about the right foot and she denied any pain in the right foot.

## 2018-04-14 DIAGNOSIS — F0390 Unspecified dementia without behavioral disturbance: Secondary | ICD-10-CM | POA: Diagnosis not present

## 2018-04-14 DIAGNOSIS — M6281 Muscle weakness (generalized): Secondary | ICD-10-CM | POA: Diagnosis not present

## 2018-04-14 DIAGNOSIS — R262 Difficulty in walking, not elsewhere classified: Secondary | ICD-10-CM | POA: Diagnosis not present

## 2018-04-14 DIAGNOSIS — Z950 Presence of cardiac pacemaker: Secondary | ICD-10-CM | POA: Diagnosis not present

## 2018-04-14 DIAGNOSIS — R41841 Cognitive communication deficit: Secondary | ICD-10-CM | POA: Diagnosis not present

## 2018-04-17 DIAGNOSIS — R41841 Cognitive communication deficit: Secondary | ICD-10-CM | POA: Diagnosis not present

## 2018-04-17 DIAGNOSIS — R262 Difficulty in walking, not elsewhere classified: Secondary | ICD-10-CM | POA: Diagnosis not present

## 2018-04-17 DIAGNOSIS — M810 Age-related osteoporosis without current pathological fracture: Secondary | ICD-10-CM | POA: Diagnosis not present

## 2018-04-17 DIAGNOSIS — Z950 Presence of cardiac pacemaker: Secondary | ICD-10-CM | POA: Diagnosis not present

## 2018-04-17 DIAGNOSIS — M6281 Muscle weakness (generalized): Secondary | ICD-10-CM | POA: Diagnosis not present

## 2018-04-17 DIAGNOSIS — F0390 Unspecified dementia without behavioral disturbance: Secondary | ICD-10-CM | POA: Diagnosis not present

## 2018-04-18 ENCOUNTER — Telehealth: Payer: Self-pay | Admitting: Cardiology

## 2018-04-18 DIAGNOSIS — R41841 Cognitive communication deficit: Secondary | ICD-10-CM | POA: Diagnosis not present

## 2018-04-18 DIAGNOSIS — M6281 Muscle weakness (generalized): Secondary | ICD-10-CM | POA: Diagnosis not present

## 2018-04-18 DIAGNOSIS — F0390 Unspecified dementia without behavioral disturbance: Secondary | ICD-10-CM | POA: Diagnosis not present

## 2018-04-18 DIAGNOSIS — R262 Difficulty in walking, not elsewhere classified: Secondary | ICD-10-CM | POA: Diagnosis not present

## 2018-04-18 DIAGNOSIS — Z950 Presence of cardiac pacemaker: Secondary | ICD-10-CM | POA: Diagnosis not present

## 2018-04-18 NOTE — Telephone Encounter (Signed)
-----   Message from Shiela Mayer, RN sent at 04/18/2018 11:02 AM EDT ----- Regarding: Disconnected monitor Hey! This patient's monitor has been disconnected for at least 21 days. Can you call the patient?  Thanks, Tobin Chad

## 2018-04-18 NOTE — Telephone Encounter (Signed)
Attempted to call pt b/c her home monitor has not updated in at least 21 days. No answer and unable to leave a voice mail.

## 2018-04-19 ENCOUNTER — Ambulatory Visit (INDEPENDENT_AMBULATORY_CARE_PROVIDER_SITE_OTHER): Payer: Self-pay | Admitting: Orthopedic Surgery

## 2018-04-19 DIAGNOSIS — E559 Vitamin D deficiency, unspecified: Secondary | ICD-10-CM | POA: Diagnosis not present

## 2018-04-19 DIAGNOSIS — Z950 Presence of cardiac pacemaker: Secondary | ICD-10-CM | POA: Diagnosis not present

## 2018-04-19 DIAGNOSIS — E039 Hypothyroidism, unspecified: Secondary | ICD-10-CM | POA: Diagnosis not present

## 2018-04-19 DIAGNOSIS — R41841 Cognitive communication deficit: Secondary | ICD-10-CM | POA: Diagnosis not present

## 2018-04-19 DIAGNOSIS — H04129 Dry eye syndrome of unspecified lacrimal gland: Secondary | ICD-10-CM | POA: Diagnosis not present

## 2018-04-19 DIAGNOSIS — M6281 Muscle weakness (generalized): Secondary | ICD-10-CM | POA: Diagnosis not present

## 2018-04-19 DIAGNOSIS — R262 Difficulty in walking, not elsewhere classified: Secondary | ICD-10-CM | POA: Diagnosis not present

## 2018-04-19 DIAGNOSIS — F0150 Vascular dementia without behavioral disturbance: Secondary | ICD-10-CM | POA: Diagnosis not present

## 2018-04-19 DIAGNOSIS — S92002A Unspecified fracture of left calcaneus, initial encounter for closed fracture: Secondary | ICD-10-CM | POA: Diagnosis not present

## 2018-04-19 DIAGNOSIS — F0390 Unspecified dementia without behavioral disturbance: Secondary | ICD-10-CM | POA: Diagnosis not present

## 2018-04-19 DIAGNOSIS — K5900 Constipation, unspecified: Secondary | ICD-10-CM | POA: Diagnosis not present

## 2018-04-19 DIAGNOSIS — R55 Syncope and collapse: Secondary | ICD-10-CM | POA: Diagnosis not present

## 2018-04-19 DIAGNOSIS — E785 Hyperlipidemia, unspecified: Secondary | ICD-10-CM | POA: Diagnosis not present

## 2018-04-19 DIAGNOSIS — S92301A Fracture of unspecified metatarsal bone(s), right foot, initial encounter for closed fracture: Secondary | ICD-10-CM | POA: Diagnosis not present

## 2018-04-19 DIAGNOSIS — D72828 Other elevated white blood cell count: Secondary | ICD-10-CM | POA: Diagnosis not present

## 2018-04-20 DIAGNOSIS — M6281 Muscle weakness (generalized): Secondary | ICD-10-CM | POA: Diagnosis not present

## 2018-04-20 DIAGNOSIS — F0390 Unspecified dementia without behavioral disturbance: Secondary | ICD-10-CM | POA: Diagnosis not present

## 2018-04-20 DIAGNOSIS — R41841 Cognitive communication deficit: Secondary | ICD-10-CM | POA: Diagnosis not present

## 2018-04-20 DIAGNOSIS — R262 Difficulty in walking, not elsewhere classified: Secondary | ICD-10-CM | POA: Diagnosis not present

## 2018-04-20 DIAGNOSIS — Z950 Presence of cardiac pacemaker: Secondary | ICD-10-CM | POA: Diagnosis not present

## 2018-04-20 NOTE — Telephone Encounter (Signed)
2nd attempt.  No answer and unable to leave a voicemail.

## 2018-04-21 DIAGNOSIS — F0390 Unspecified dementia without behavioral disturbance: Secondary | ICD-10-CM | POA: Diagnosis not present

## 2018-04-21 DIAGNOSIS — R262 Difficulty in walking, not elsewhere classified: Secondary | ICD-10-CM | POA: Diagnosis not present

## 2018-04-21 DIAGNOSIS — Z950 Presence of cardiac pacemaker: Secondary | ICD-10-CM | POA: Diagnosis not present

## 2018-04-21 DIAGNOSIS — M6281 Muscle weakness (generalized): Secondary | ICD-10-CM | POA: Diagnosis not present

## 2018-04-21 DIAGNOSIS — R41841 Cognitive communication deficit: Secondary | ICD-10-CM | POA: Diagnosis not present

## 2018-04-24 DIAGNOSIS — M6281 Muscle weakness (generalized): Secondary | ICD-10-CM | POA: Diagnosis not present

## 2018-04-24 DIAGNOSIS — R41841 Cognitive communication deficit: Secondary | ICD-10-CM | POA: Diagnosis not present

## 2018-04-24 DIAGNOSIS — F0390 Unspecified dementia without behavioral disturbance: Secondary | ICD-10-CM | POA: Diagnosis not present

## 2018-04-24 DIAGNOSIS — R262 Difficulty in walking, not elsewhere classified: Secondary | ICD-10-CM | POA: Diagnosis not present

## 2018-04-24 DIAGNOSIS — Z950 Presence of cardiac pacemaker: Secondary | ICD-10-CM | POA: Diagnosis not present

## 2018-04-25 DIAGNOSIS — M6281 Muscle weakness (generalized): Secondary | ICD-10-CM | POA: Diagnosis not present

## 2018-04-25 DIAGNOSIS — R41841 Cognitive communication deficit: Secondary | ICD-10-CM | POA: Diagnosis not present

## 2018-04-25 DIAGNOSIS — Z950 Presence of cardiac pacemaker: Secondary | ICD-10-CM | POA: Diagnosis not present

## 2018-04-25 DIAGNOSIS — R262 Difficulty in walking, not elsewhere classified: Secondary | ICD-10-CM | POA: Diagnosis not present

## 2018-04-25 DIAGNOSIS — F0390 Unspecified dementia without behavioral disturbance: Secondary | ICD-10-CM | POA: Diagnosis not present

## 2018-04-26 ENCOUNTER — Encounter: Payer: Medicare Other | Admitting: Internal Medicine

## 2018-04-26 DIAGNOSIS — Z950 Presence of cardiac pacemaker: Secondary | ICD-10-CM | POA: Diagnosis not present

## 2018-04-26 DIAGNOSIS — R262 Difficulty in walking, not elsewhere classified: Secondary | ICD-10-CM | POA: Diagnosis not present

## 2018-04-26 DIAGNOSIS — R41841 Cognitive communication deficit: Secondary | ICD-10-CM | POA: Diagnosis not present

## 2018-04-26 DIAGNOSIS — F0390 Unspecified dementia without behavioral disturbance: Secondary | ICD-10-CM | POA: Diagnosis not present

## 2018-04-26 DIAGNOSIS — M6281 Muscle weakness (generalized): Secondary | ICD-10-CM | POA: Diagnosis not present

## 2018-04-27 DIAGNOSIS — R262 Difficulty in walking, not elsewhere classified: Secondary | ICD-10-CM | POA: Diagnosis not present

## 2018-04-27 DIAGNOSIS — R41841 Cognitive communication deficit: Secondary | ICD-10-CM | POA: Diagnosis not present

## 2018-04-27 DIAGNOSIS — M6281 Muscle weakness (generalized): Secondary | ICD-10-CM | POA: Diagnosis not present

## 2018-04-27 DIAGNOSIS — F0390 Unspecified dementia without behavioral disturbance: Secondary | ICD-10-CM | POA: Diagnosis not present

## 2018-04-27 DIAGNOSIS — Z950 Presence of cardiac pacemaker: Secondary | ICD-10-CM | POA: Diagnosis not present

## 2018-04-28 ENCOUNTER — Encounter: Payer: Self-pay | Admitting: Cardiology

## 2018-04-28 DIAGNOSIS — R41841 Cognitive communication deficit: Secondary | ICD-10-CM | POA: Diagnosis not present

## 2018-04-28 DIAGNOSIS — R262 Difficulty in walking, not elsewhere classified: Secondary | ICD-10-CM | POA: Diagnosis not present

## 2018-04-28 DIAGNOSIS — F0390 Unspecified dementia without behavioral disturbance: Secondary | ICD-10-CM | POA: Diagnosis not present

## 2018-04-28 DIAGNOSIS — Z950 Presence of cardiac pacemaker: Secondary | ICD-10-CM | POA: Diagnosis not present

## 2018-04-28 DIAGNOSIS — M6281 Muscle weakness (generalized): Secondary | ICD-10-CM | POA: Diagnosis not present

## 2018-04-28 NOTE — Telephone Encounter (Signed)
3rd attempt.   No answer and unable to leave a voicemail.

## 2018-05-02 DIAGNOSIS — R41841 Cognitive communication deficit: Secondary | ICD-10-CM | POA: Diagnosis not present

## 2018-05-02 DIAGNOSIS — R2689 Other abnormalities of gait and mobility: Secondary | ICD-10-CM | POA: Diagnosis not present

## 2018-05-02 DIAGNOSIS — M6281 Muscle weakness (generalized): Secondary | ICD-10-CM | POA: Diagnosis not present

## 2018-05-02 DIAGNOSIS — D72829 Elevated white blood cell count, unspecified: Secondary | ICD-10-CM | POA: Diagnosis not present

## 2018-05-02 DIAGNOSIS — Z950 Presence of cardiac pacemaker: Secondary | ICD-10-CM | POA: Diagnosis not present

## 2018-05-02 DIAGNOSIS — F0390 Unspecified dementia without behavioral disturbance: Secondary | ICD-10-CM | POA: Diagnosis not present

## 2018-05-02 DIAGNOSIS — R262 Difficulty in walking, not elsewhere classified: Secondary | ICD-10-CM | POA: Diagnosis not present

## 2018-05-02 DIAGNOSIS — Z9181 History of falling: Secondary | ICD-10-CM | POA: Diagnosis not present

## 2018-05-02 DIAGNOSIS — R55 Syncope and collapse: Secondary | ICD-10-CM | POA: Diagnosis not present

## 2018-05-04 DIAGNOSIS — F0390 Unspecified dementia without behavioral disturbance: Secondary | ICD-10-CM | POA: Diagnosis not present

## 2018-05-04 DIAGNOSIS — R262 Difficulty in walking, not elsewhere classified: Secondary | ICD-10-CM | POA: Diagnosis not present

## 2018-05-04 DIAGNOSIS — R55 Syncope and collapse: Secondary | ICD-10-CM | POA: Diagnosis not present

## 2018-05-04 DIAGNOSIS — Z950 Presence of cardiac pacemaker: Secondary | ICD-10-CM | POA: Diagnosis not present

## 2018-05-04 DIAGNOSIS — D72829 Elevated white blood cell count, unspecified: Secondary | ICD-10-CM | POA: Diagnosis not present

## 2018-05-04 DIAGNOSIS — R41841 Cognitive communication deficit: Secondary | ICD-10-CM | POA: Diagnosis not present

## 2018-05-08 DIAGNOSIS — Z950 Presence of cardiac pacemaker: Secondary | ICD-10-CM | POA: Diagnosis not present

## 2018-05-08 DIAGNOSIS — R262 Difficulty in walking, not elsewhere classified: Secondary | ICD-10-CM | POA: Diagnosis not present

## 2018-05-08 DIAGNOSIS — Z9181 History of falling: Secondary | ICD-10-CM | POA: Diagnosis not present

## 2018-05-08 DIAGNOSIS — M6281 Muscle weakness (generalized): Secondary | ICD-10-CM | POA: Diagnosis not present

## 2018-05-08 DIAGNOSIS — R2689 Other abnormalities of gait and mobility: Secondary | ICD-10-CM | POA: Diagnosis not present

## 2018-05-08 DIAGNOSIS — F0390 Unspecified dementia without behavioral disturbance: Secondary | ICD-10-CM | POA: Diagnosis not present

## 2018-05-08 DIAGNOSIS — R55 Syncope and collapse: Secondary | ICD-10-CM | POA: Diagnosis not present

## 2018-05-08 DIAGNOSIS — D72829 Elevated white blood cell count, unspecified: Secondary | ICD-10-CM | POA: Diagnosis not present

## 2018-05-08 DIAGNOSIS — R41841 Cognitive communication deficit: Secondary | ICD-10-CM | POA: Diagnosis not present

## 2018-05-09 DIAGNOSIS — R41841 Cognitive communication deficit: Secondary | ICD-10-CM | POA: Diagnosis not present

## 2018-05-09 DIAGNOSIS — R262 Difficulty in walking, not elsewhere classified: Secondary | ICD-10-CM | POA: Diagnosis not present

## 2018-05-09 DIAGNOSIS — F0390 Unspecified dementia without behavioral disturbance: Secondary | ICD-10-CM | POA: Diagnosis not present

## 2018-05-09 DIAGNOSIS — Z950 Presence of cardiac pacemaker: Secondary | ICD-10-CM | POA: Diagnosis not present

## 2018-05-09 DIAGNOSIS — D72829 Elevated white blood cell count, unspecified: Secondary | ICD-10-CM | POA: Diagnosis not present

## 2018-05-09 DIAGNOSIS — R55 Syncope and collapse: Secondary | ICD-10-CM | POA: Diagnosis not present

## 2018-05-11 DIAGNOSIS — R41841 Cognitive communication deficit: Secondary | ICD-10-CM | POA: Diagnosis not present

## 2018-05-11 DIAGNOSIS — R55 Syncope and collapse: Secondary | ICD-10-CM | POA: Diagnosis not present

## 2018-05-11 DIAGNOSIS — Z950 Presence of cardiac pacemaker: Secondary | ICD-10-CM | POA: Diagnosis not present

## 2018-05-11 DIAGNOSIS — D72829 Elevated white blood cell count, unspecified: Secondary | ICD-10-CM | POA: Diagnosis not present

## 2018-05-11 DIAGNOSIS — R262 Difficulty in walking, not elsewhere classified: Secondary | ICD-10-CM | POA: Diagnosis not present

## 2018-05-11 DIAGNOSIS — F0390 Unspecified dementia without behavioral disturbance: Secondary | ICD-10-CM | POA: Diagnosis not present

## 2018-05-17 ENCOUNTER — Encounter: Payer: Self-pay | Admitting: Internal Medicine

## 2018-05-31 ENCOUNTER — Telehealth: Payer: Self-pay | Admitting: Cardiology

## 2018-05-31 NOTE — Telephone Encounter (Signed)
Called pt due to disconnected monitor. After looking for the monitor the patient informed me that she lost it. I instructed her to call tech support to have a new monitor ordered. Pt verbalized understanding.

## 2018-06-09 DIAGNOSIS — H00014 Hordeolum externum left upper eyelid: Secondary | ICD-10-CM | POA: Diagnosis not present

## 2018-07-03 ENCOUNTER — Encounter: Payer: Self-pay | Admitting: Internal Medicine

## 2018-07-03 ENCOUNTER — Ambulatory Visit (INDEPENDENT_AMBULATORY_CARE_PROVIDER_SITE_OTHER): Payer: Medicare Other | Admitting: Internal Medicine

## 2018-07-03 VITALS — BP 124/74 | HR 81 | Ht 64.0 in | Wt 131.0 lb

## 2018-07-03 DIAGNOSIS — I495 Sick sinus syndrome: Secondary | ICD-10-CM | POA: Diagnosis not present

## 2018-07-03 DIAGNOSIS — R55 Syncope and collapse: Secondary | ICD-10-CM

## 2018-07-03 DIAGNOSIS — Z950 Presence of cardiac pacemaker: Secondary | ICD-10-CM | POA: Diagnosis not present

## 2018-07-03 LAB — CUP PACEART INCLINIC DEVICE CHECK
Date Time Interrogation Session: 20191230140837
Implantable Lead Implant Date: 20190923
Implantable Lead Location: 753859
Implantable Lead Serial Number: 80773242
Implantable Pulse Generator Implant Date: 20190923
Lead Channel Impedance Value: 682 Ohm
Lead Channel Pacing Threshold Amplitude: 0.7 V
Lead Channel Pacing Threshold Amplitude: 0.8 V
Lead Channel Pacing Threshold Amplitude: 0.8 V
Lead Channel Pacing Threshold Pulse Width: 0.4 ms
Lead Channel Pacing Threshold Pulse Width: 0.4 ms
Lead Channel Pacing Threshold Pulse Width: 0.4 ms
Lead Channel Pacing Threshold Pulse Width: 0.4 ms
Lead Channel Sensing Intrinsic Amplitude: 9.6 mV
Lead Channel Setting Pacing Amplitude: 2 V
Lead Channel Setting Pacing Amplitude: 2 V
Lead Channel Setting Pacing Pulse Width: 0.4 ms
MDC IDC LEAD IMPLANT DT: 20190923
MDC IDC LEAD LOCATION: 753860
MDC IDC LEAD SERIAL: 80830530
MDC IDC MSMT LEADCHNL RA IMPEDANCE VALUE: 507 Ohm
MDC IDC MSMT LEADCHNL RA PACING THRESHOLD AMPLITUDE: 0.8 V
MDC IDC MSMT LEADCHNL RA PACING THRESHOLD PULSEWIDTH: 0.4 ms
MDC IDC MSMT LEADCHNL RA SENSING INTR AMPL: 4.7 mV
MDC IDC MSMT LEADCHNL RA SENSING INTR AMPL: 5.2 mV
MDC IDC MSMT LEADCHNL RV PACING THRESHOLD AMPLITUDE: 0.7 V
MDC IDC MSMT LEADCHNL RV PACING THRESHOLD AMPLITUDE: 0.8 V
MDC IDC MSMT LEADCHNL RV PACING THRESHOLD PULSEWIDTH: 0.4 ms
MDC IDC MSMT LEADCHNL RV SENSING INTR AMPL: 9.5 mV
Pulse Gen Model: 407145
Pulse Gen Serial Number: 69439851

## 2018-07-03 NOTE — Progress Notes (Signed)
HPI Laurie Klein returns today for followup of her ILR and syncope. She is a pleasant slightly demented 84 yo woman with syncope s/p ILR who had a recurrent syncopal episode and was found on her ILR to have an episode of bradycardia with HR"s in the 30 range. She underwent PPM insertion several months ago. In the interim she has done well with no syncope. Allergies  Allergen Reactions  . Acrylic Polymer Other (See Comments)    Blisters inside mouth   . Lipitor [Atorvastatin Calcium] Other (See Comments)    Myalgias and muscle aches   . Orudis Kt Other (See Comments)    Unknown   . Sulfa Antibiotics Itching  . Amoxicillin Itching and Rash    Has patient had a PCN reaction causing immediate rash, facial/tongue/throat swelling, SOB or lightheadedness with hypotension: Yes Has patient had a PCN reaction causing severe rash involving mucus membranes or skin necrosis: No Has patient had a PCN reaction that required hospitalization: No Has patient had a PCN reaction occurring within the last 10 years: No If all of the above answers are "NO", then may proceed with Cephalosporin use.      Current Outpatient Medications  Medication Sig Dispense Refill  . acetaminophen (TYLENOL) 325 MG tablet Take 2 tablets (650 mg total) by mouth every 4 (four) hours as needed for mild pain or fever. 30 tablet 2  . Calcium Citrate (CITRACAL PO) Take 1 tablet by mouth daily.     . cholecalciferol (VITAMIN D) 1000 UNITS tablet Take 1,000 Units by mouth daily.     Marland Kitchen co-enzyme Q-10 30 MG capsule Take 100 mg by mouth daily.     Marland Kitchen donepezil (ARICEPT) 10 MG tablet Take 10 mg by mouth at bedtime.    . fish oil-omega-3 fatty acids 1000 MG capsule Take 2 g by mouth daily.     Marland Kitchen HYDROcodone-acetaminophen (NORCO/VICODIN) 5-325 MG tablet Take 1 tablet by mouth every 4 (four) hours as needed for moderate pain. 15 tablet 0  . Multiple Vitamin (MULTIVITAMIN) capsule Take 1 capsule by mouth daily.    . polyethylene glycol  (MIRALAX / GLYCOLAX) packet Take 17 g by mouth daily as needed for mild constipation. 14 each 0  . simvastatin (ZOCOR) 20 MG tablet Take 20 mg by mouth every evening.    Marland Kitchen SYNTHROID 75 MCG tablet Take 75 mcg by mouth daily.    Marland Kitchen tetrahydrozoline (VISINE) 0.05 % ophthalmic solution Place 3 drops into both eyes daily as needed (For dry eyes).      No current facility-administered medications for this visit.      Past Medical History:  Diagnosis Date  . Anxiety   . Chronic headaches    history of  . DDD (degenerative disc disease) 05/24/2013  . Degenerative disc disease   . Dyslipidemia   . Headache(784.0): chronic 05/24/2013  . Heart palpitations   . Hypothyroidism   . Mitral valve prolapse   . MVP (mitral valve prolapse) 05/24/2013  . PAC (premature atrial contraction)   . Presence of permanent cardiac pacemaker 03/27/2018  . Spinal stenosis   . TMJ syndrome     ROS:   All systems reviewed and negative except as noted in the HPI.   Past Surgical History:  Procedure Laterality Date  . APPENDECTOMY    . BACK SURGERY    . BREAST BIOPSY  3 times  . BREAST EXCISIONAL BIOPSY Right 1970   benign  . BREAST EXCISIONAL BIOPSY Left  1950   2 benign  . BREAST EXCISIONAL BIOPSY Right 1950   benign  . CHOLECYSTECTOMY    . LOOP RECORDER INSERTION N/A 01/17/2018   Procedure: LOOP RECORDER INSERTION;  Surgeon: Evans Lance, MD;  Location: [facility];  Service: Cardiovascular;  Laterality: N/A;  . LOOP RECORDER REMOVAL N/A 03/27/2018   Procedure: LOOP RECORDER REMOVAL;  Surgeon: Evans Lance, MD;  Location: [facility];  Service: Cardiovascular;  Laterality: N/A;  . LUMBAR MICRODISCECTOMY    . PACEMAKER IMPLANT N/A 03/27/2018   Procedure: PACEMAKER IMPLANT;  Surgeon: Evans Lance, MD;  Location: [facility];  Service: Cardiovascular;  Laterality: N/A;  . TONSILLECTOMY       Family History  Problem Relation Age of Onset  . Heart failure Mother 28  .  Stroke Mother   . Heart disease Father   . Melanoma Child      Social History   Socioeconomic History  . Marital status: Widowed    Spouse name: Not on file  . Number of children: Not on file  . Years of education: Not on file  . Highest education level: Not on file  Occupational History  . Occupation: retired Transport planner  . Financial resource strain: Not on file  . Food insecurity:    Worry: Not on file    Inability: Not on file  . Transportation needs:    Medical: Not on file    Non-medical: Not on file  Tobacco Use  . Smoking status: Former Smoker    Packs/day: 0.50    Types: Cigarettes    Last attempt to quit: 10/03/1973    Years since quitting: 44.7  . Smokeless tobacco: Never Used  Substance and Sexual Activity  . Alcohol use: Yes    Alcohol/week: 3.0 standard drinks    Types: 3 Standard drinks or equivalent per week    Comment: 4 glasses wine per week  . Drug use: No  . Sexual activity: Not on file  Lifestyle  . Physical activity:    Days per week: Not on file    Minutes per session: Not on file  . Stress: Not on file  Relationships  . Social connections:    Talks on phone: Not on file    Gets together: Not on file    Attends religious service: Not on file    Active member of club or organization: Not on file    Attends meetings of clubs or organizations: Not on file    Relationship status: Not on file  . Intimate partner violence:    Fear of current or ex partner: Not on file    Emotionally abused: Not on file    Physically abused: Not on file    Forced sexual activity: Not on file  Other Topics Concern  . Not on file  Social History Narrative  . Not on file     BP 124/74   Pulse 81   Ht 5\' 4"  (1.626 m)   Wt 131 lb (59.4 kg)   BMI 22.49 kg/m   Physical Exam:  Well appearing NAD HEENT: Unremarkable Neck:  No JVD, no thyromegally Lymphatics:  No adenopathy Back:  No CVA tenderness Lungs:  Clear with no wheezes HEART:   Regular rate rhythm, no murmurs, no rubs, no clicks Abd:  soft, positive bowel sounds, no organomegally, no rebound, no guarding Ext:  2 plus pulses, no edema, no cyanosis, no clubbing Skin:  No  rashes no nodules Neuro:  CN II through XII intact, motor grossly intact  EKG - nsr with atrial pacing  DEVICE  Normal device function.  See PaceArt for details.   Assess/Plan: 1. Sinus node dysfunction - she is doing well,s /p PPM insertion. 2. PPM - her Biotronik DDD PM is working normally. 3.syncope - she has not had any spells since her PPM was placed.  Mikle Bosworth.D.

## 2018-07-03 NOTE — Patient Instructions (Signed)
Medication Instructions:  Your physician recommends that you continue on your current medications as directed. Please refer to the Current Medication list given to you today.  Labwork: None ordered.  Testing/Procedures: None ordered.  Follow-Up: Your physician wants you to follow-up in: 9 months with Dr. Taylor. You will receive a reminder letter in the mail two months in advance. If you don't receive a letter, please call our office to schedule the follow-up appointment.  Remote monitoring is used to monitor your Pacemaker from home. This monitoring reduces the number of office visits required to check your device to one time per year. It allows us to keep an eye on the functioning of your device to ensure it is working properly. You are scheduled for a device check from home on 10/02/2018. You may send your transmission at any time that day. If you have a wireless device, the transmission will be sent automatically. After your physician reviews your transmission, you will receive a postcard with your next transmission date.  Any Other Special Instructions Will Be Listed Below (If Applicable).  If you need a refill on your cardiac medications before your next appointment, please call your pharmacy.   

## 2018-07-10 ENCOUNTER — Telehealth: Payer: Self-pay | Admitting: Cardiology

## 2018-07-10 NOTE — Telephone Encounter (Signed)
Spoke w/ pt b/c her biotronik home monitor is not communicating. Pt is very confused and doesn't believe she has a home monitor. Tried to explain to the patient what the monitor looks like but is still unaware of what she was looking for.

## 2018-07-11 NOTE — Telephone Encounter (Signed)
Spoke w/ pt and she stated that she can not find the home monitor. Patient agrees to come to the office every 6 months to have her PPM checked and is aware that a letter will be mailed to call and schedule an appt.

## 2018-07-25 ENCOUNTER — Telehealth: Payer: Self-pay

## 2018-07-25 DIAGNOSIS — M25511 Pain in right shoulder: Secondary | ICD-10-CM | POA: Diagnosis not present

## 2018-07-25 DIAGNOSIS — R51 Headache: Secondary | ICD-10-CM | POA: Diagnosis not present

## 2018-07-25 DIAGNOSIS — N390 Urinary tract infection, site not specified: Secondary | ICD-10-CM | POA: Diagnosis not present

## 2018-07-25 DIAGNOSIS — R319 Hematuria, unspecified: Secondary | ICD-10-CM | POA: Diagnosis not present

## 2018-07-25 NOTE — Telephone Encounter (Signed)
Pt had a fall and Laurie Klein her care giver has questions about Biotronik monitors. She states that the patient does have dementia. We gave her Biotronik tech support number for additional help with the monitor.

## 2018-08-02 DIAGNOSIS — L821 Other seborrheic keratosis: Secondary | ICD-10-CM | POA: Diagnosis not present

## 2018-08-02 DIAGNOSIS — Z808 Family history of malignant neoplasm of other organs or systems: Secondary | ICD-10-CM | POA: Diagnosis not present

## 2018-08-02 DIAGNOSIS — Z85820 Personal history of malignant melanoma of skin: Secondary | ICD-10-CM | POA: Diagnosis not present

## 2018-08-02 DIAGNOSIS — D225 Melanocytic nevi of trunk: Secondary | ICD-10-CM | POA: Diagnosis not present

## 2018-08-02 DIAGNOSIS — D2272 Melanocytic nevi of left lower limb, including hip: Secondary | ICD-10-CM | POA: Diagnosis not present

## 2018-08-02 DIAGNOSIS — Z23 Encounter for immunization: Secondary | ICD-10-CM | POA: Diagnosis not present

## 2018-09-07 DIAGNOSIS — E039 Hypothyroidism, unspecified: Secondary | ICD-10-CM | POA: Diagnosis not present

## 2018-09-07 DIAGNOSIS — G301 Alzheimer's disease with late onset: Secondary | ICD-10-CM | POA: Diagnosis not present

## 2018-10-10 DIAGNOSIS — G301 Alzheimer's disease with late onset: Secondary | ICD-10-CM | POA: Diagnosis not present

## 2018-10-10 DIAGNOSIS — K5901 Slow transit constipation: Secondary | ICD-10-CM | POA: Diagnosis not present

## 2018-10-26 DIAGNOSIS — G301 Alzheimer's disease with late onset: Secondary | ICD-10-CM | POA: Diagnosis not present

## 2018-10-26 DIAGNOSIS — K5901 Slow transit constipation: Secondary | ICD-10-CM | POA: Diagnosis not present

## 2018-10-26 DIAGNOSIS — W19XXXA Unspecified fall, initial encounter: Secondary | ICD-10-CM | POA: Diagnosis not present

## 2018-10-26 DIAGNOSIS — R1030 Lower abdominal pain, unspecified: Secondary | ICD-10-CM | POA: Diagnosis not present

## 2018-11-28 ENCOUNTER — Ambulatory Visit (INDEPENDENT_AMBULATORY_CARE_PROVIDER_SITE_OTHER): Payer: Medicare Other | Admitting: Sports Medicine

## 2018-11-28 ENCOUNTER — Other Ambulatory Visit: Payer: Self-pay

## 2018-11-28 ENCOUNTER — Encounter: Payer: Self-pay | Admitting: Sports Medicine

## 2018-11-28 VITALS — BP 128/83 | HR 70 | Resp 16

## 2018-11-28 DIAGNOSIS — M79674 Pain in right toe(s): Secondary | ICD-10-CM

## 2018-11-28 DIAGNOSIS — L03031 Cellulitis of right toe: Secondary | ICD-10-CM | POA: Diagnosis not present

## 2018-11-28 MED ORDER — NEOMYCIN-POLYMYXIN-HC 3.5-10000-1 OT SOLN: OTIC | 0 refills | Status: DC

## 2018-11-28 MED ORDER — CLINDAMYCIN HCL 300 MG PO CAPS: 300.0000 mg | ORAL_CAPSULE | Freq: Three times a day (TID) | ORAL | 0 refills | Status: DC

## 2018-11-28 NOTE — Progress Notes (Signed)
Subjective: Laurie Klein is a 85 y.o. female patient presents to office today complaining of a moderately painful incurvated, red, hot, swollen R 1st toe. This has been present for 2 days. Patient has treated this by nothing is from [facility] and is assisted by facility aide who helps to report history. Denies injury to toe or nail. Patient denies fever/chills/nausea/vomitting/any other related constitutional symptoms at this time.  Review of Systems  All other systems reviewed and are negative.    Patient Active Problem List   Diagnosis Date Noted  . Closed left ankle fracture 03/28/2018  . Leukocytosis 03/27/2018  . Symptomatic bradycardia 03/25/2018  . Dementia ([place]) 03/25/2018  . Contusion of right periocular region   . Great toe pain, left   . Syncope 04/19/2017  . Syncope and collapse 05/24/2013  . MVP (mitral valve prolapse) 05/24/2013  . Anxiety 05/24/2013  . Dyslipidemia 05/24/2013  . Hypothyroidism 05/24/2013  . Headache(784.0): chronic 05/24/2013  . Spinal stenosis 05/24/2013  . PAC (premature atrial contraction) 05/24/2013  . DDD (degenerative disc disease) 05/24/2013    Current Outpatient Medications on File Prior to Visit  Medication Sig Dispense Refill  . acetaminophen (TYLENOL) 325 MG tablet Take 2 tablets (650 mg total) by mouth every 4 (four) hours as needed for mild pain or fever. 30 tablet 2  . Calcium Citrate (CITRACAL PO) Take 1 tablet by mouth daily.     . cholecalciferol (VITAMIN D) 1000 UNITS tablet Take 1,000 Units by mouth daily.     Marland Kitchen co-enzyme Q-10 30 MG capsule Take 100 mg by mouth daily.     Marland Kitchen donepezil (ARICEPT) 10 MG tablet Take 10 mg by mouth at bedtime.    . fish oil-omega-3 fatty acids 1000 MG capsule Take 2 g by mouth daily.     . Multiple Vitamin (MULTIVITAMIN) capsule Take 1 capsule by mouth daily.    . polyethylene glycol (MIRALAX / GLYCOLAX) packet Take 17 g by mouth daily as needed for mild constipation. 14 each 0  . simvastatin  (ZOCOR) 20 MG tablet Take 20 mg by mouth every evening.    Marland Kitchen SYNTHROID 75 MCG tablet Take 75 mcg by mouth daily.    Marland Kitchen tetrahydrozoline (VISINE) 0.05 % ophthalmic solution Place 3 drops into both eyes daily as needed (For dry eyes).      No current facility-administered medications on file prior to visit.     Allergies  Allergen Reactions  . Acrylic Polymer Other (See Comments)    Blisters inside mouth   . Lipitor [Atorvastatin Calcium] Other (See Comments)    Myalgias and muscle aches   . Orudis Kt Other (See Comments)    Unknown   . Sulfa Antibiotics Itching  . Amoxicillin Itching and Rash    Has patient had a PCN reaction causing immediate rash, facial/tongue/throat swelling, SOB or lightheadedness with hypotension: Yes Has patient had a PCN reaction causing severe rash involving mucus membranes or skin necrosis: No Has patient had a PCN reaction that required hospitalization: No Has patient had a PCN reaction occurring within the last 10 years: No If all of the above answers are "NO", then may proceed with Cephalosporin use.     Objective:  There were no vitals filed for this visit.  General: Well developed, nourished, in no acute distress, alert and oriented x2  Dermatology: Skin is warm, dry and supple bilateral. Right hallux nail appears to be  severely incurvated with hyperkeratosis formation at the distal aspects of  the medial  and lateral nail border and pain at proximal nail fold. (+) Erythema. (+) Edema. (+) serosanguous drainage present. The remaining nails appear unremarkable at this time. There are no open sores, lesions or other signs of infection  present.  Vascular: Dorsalis Pedis artery and Posterior Tibial artery pedal pulses are 1/4 bilateral with immedate capillary fill time. Pedal hair growth present. No lower extremity edema.   Neruologic: Grossly intact via light touch bilateral.  Musculoskeletal: Tenderness to palpation of the proximal nail fold right  1st toe. Muscular strength within normal limits in all groups bilateral.   Assesement and Plan: Problem List Items Addressed This Visit    None    Visit Diagnoses    Paronychia, toe, right    -  Primary   Relevant Medications   clindamycin (CLEOCIN) 300 MG capsule   Toe pain, right          -Discussed treatment alternatives and plan of care; Explained permanent/temporary nail avulsion and post procedure course to patient. Patient elects for removal of infected toenail on right - After a verbal and written consent, injected 3 ml of a 50:50 mixture of 2% plain  lidocaine and 0.5% plain marcaine in a normal hallux block fashion. Next, a  betadine prep was performed. Anesthesia was tested and found to be appropriate.  The offending Right 1st toenail completely was then incised from the hyponychium to the epinychium. The nail was removed and cleared from the field. The area was curretted for any remaining nail or spicules. Phenol application performed and the area was then flushed with alcohol and dressed with antibiotic cream and a dry sterile dressing. -Patient was instructed to leave the dressing intact for today and begin soaking  in a weak solution of betadine or Epsom salt and water tomorrow. Patient was instructed to  soak for 15-20 minutes every other day and apply neosporin/corticosporin and a gauze or bandaid dressing every other day with help of nursing -Rx Clindamycin  -Patient was instructed to monitor the toe for signs of infection and return to office if toe becomes red, hot or swollen. -Advised ice, elevation, and tylenol or motrin if needed for pain.  -Patient is to return in 2 weeks for follow up care/nail check or sooner if problems arise.  Landis Martins, DPM

## 2018-11-28 NOTE — Patient Instructions (Signed)
Soak Instructions    THE DAY AFTER THE PROCEDURE  Place 1/4 cup of epsom salts in a quart of warm tap water.  Submerge your foot or feet with outer bandage intact for the initial soak; this will allow the bandage to become moist and wet for easy lift off.  Once you remove your bandage, continue to soak in the solution for 20 minutes.  This soak should be done every other day.  Next, remove your foot or feet from solution, blot dry the affected area and cover.  You may use a band aid large enough to cover the area or use gauze and tape.  Apply other medications to the area as directed by the doctor such as polysporin neosporin/Corticosporin solution to toe every other day.  IF YOUR SKIN BECOMES IRRITATED WHILE USING THESE INSTRUCTIONS, IT IS OKAY TO SWITCH TO  WHITE VINEGAR AND WATER. Or you may use antibacterial soap and water to keep the toe clean  Monitor for any signs/symptoms of infection. Call the office immediately if any occur or go directly to the emergency room. Call with any questions/concerns.   Daily take PO clindamycin.

## 2018-12-12 ENCOUNTER — Ambulatory Visit (INDEPENDENT_AMBULATORY_CARE_PROVIDER_SITE_OTHER): Payer: Medicare Other | Admitting: Sports Medicine

## 2018-12-12 ENCOUNTER — Other Ambulatory Visit: Payer: Self-pay

## 2018-12-12 ENCOUNTER — Encounter: Payer: Self-pay | Admitting: Sports Medicine

## 2018-12-12 VITALS — Temp 97.7°F

## 2018-12-12 DIAGNOSIS — Z9889 Other specified postprocedural states: Secondary | ICD-10-CM

## 2018-12-12 DIAGNOSIS — M79674 Pain in right toe(s): Secondary | ICD-10-CM

## 2018-12-12 NOTE — Progress Notes (Signed)
Subjective: Laurie Klein is a 85 y.o. female patient returns to office today for follow up evaluation after having Right hallux nail completely removed on 11/28/18 . Patient has been soaking using epsom salt and applying topical antibiotic covered with bandaid daily with help from her facility nurses. Patient denies fever/chills/nausea/vomitting/any other related constitutional symptoms at this time.  Patient Active Problem List   Diagnosis Date Noted  . Closed left ankle fracture 03/28/2018  . Leukocytosis 03/27/2018  . Symptomatic bradycardia 03/25/2018  . Dementia ([place]) 03/25/2018  . Contusion of right periocular region   . Great toe pain, left   . Syncope 04/19/2017  . Syncope and collapse 05/24/2013  . MVP (mitral valve prolapse) 05/24/2013  . Anxiety 05/24/2013  . Dyslipidemia 05/24/2013  . Hypothyroidism 05/24/2013  . Headache(784.0): chronic 05/24/2013  . Spinal stenosis 05/24/2013  . PAC (premature atrial contraction) 05/24/2013  . DDD (degenerative disc disease) 05/24/2013    Current Outpatient Medications on File Prior to Visit  Medication Sig Dispense Refill  . acetaminophen (TYLENOL) 325 MG tablet Take 2 tablets (650 mg total) by mouth every 4 (four) hours as needed for mild pain or fever. 30 tablet 2  . Calcium Citrate (CITRACAL PO) Take 1 tablet by mouth daily.     . cholecalciferol (VITAMIN D) 1000 UNITS tablet Take 1,000 Units by mouth daily.     . clindamycin (CLEOCIN) 300 MG capsule Take 1 capsule (300 mg total) by mouth 3 (three) times daily. 30 capsule 0  . co-enzyme Q-10 30 MG capsule Take 100 mg by mouth daily.     Marland Kitchen donepezil (ARICEPT) 10 MG tablet Take 10 mg by mouth at bedtime.    . fish oil-omega-3 fatty acids 1000 MG capsule Take 2 g by mouth daily.     . Multiple Vitamin (MULTIVITAMIN) capsule Take 1 capsule by mouth daily.    Marland Kitchen neomycin-polymyxin-hydrocortisone (CORTISPORIN) OTIC solution Apply 1-2 drops to right great toe covered with bandaid every  other day 10 mL 0  . polyethylene glycol (MIRALAX / GLYCOLAX) packet Take 17 g by mouth daily as needed for mild constipation. 14 each 0  . simvastatin (ZOCOR) 20 MG tablet Take 20 mg by mouth every evening.    Marland Kitchen SYNTHROID 75 MCG tablet Take 75 mcg by mouth daily.    Marland Kitchen tetrahydrozoline (VISINE) 0.05 % ophthalmic solution Place 3 drops into both eyes daily as needed (For dry eyes).      No current facility-administered medications on file prior to visit.     Allergies  Allergen Reactions  . Acrylic Polymer Other (See Comments)    Blisters inside mouth   . Lipitor [Atorvastatin Calcium] Other (See Comments)    Myalgias and muscle aches   . Orudis Kt Other (See Comments)    Unknown   . Sulfa Antibiotics Itching  . Amoxicillin Itching and Rash    Has patient had a PCN reaction causing immediate rash, facial/tongue/throat swelling, SOB or lightheadedness with hypotension: Yes Has patient had a PCN reaction causing severe rash involving mucus membranes or skin necrosis: No Has patient had a PCN reaction that required hospitalization: No Has patient had a PCN reaction occurring within the last 10 years: No If all of the above answers are "NO", then may proceed with Cephalosporin use.     Objective:  General: Well developed, nourished, in no acute distress, alert and oriented x3   Dermatology: Skin is warm, dry and supple bilateral. Right hallux nail bed appears to be clean,  dry, with mild granular tissue and surrounding eschar/scab. (-) Erythema. (-) Edema. (-) serosanguous drainage present. The remaining nails appear unremarkable at this time. There are no other lesions or other signs of infection  present.  Neurovascular status: Intact. No lower extremity swelling; No pain with calf compression bilateral.  Musculoskeletal: Decreased tenderness to palpation of the Right hallux nail bed. Muscular strength within normal limits bilateral.   Assesement and Plan: Problem List Items  Addressed This Visit    None    Visit Diagnoses    S/P nail surgery    -  Primary   Toe pain, right          -Examined patient  -Cleansed right hallux nail fold and gently scrubbed with peroxide and q-tip/curetted away eschar at site and applied antibiotic cream covered with bandaid.  -Discussed plan of care with patient. -Patient to d/c soaking and covering since area is healing well  -Educated patient on long term care after nail surgery. -Patient was instructed to monitor the toe for reoccurrence and signs of infection; Patient advised to return to office or go to ER if toe becomes red, hot or swollen. -Patient is to return as needed or sooner if problems arise.  Landis Martins, DPM

## 2019-03-08 ENCOUNTER — Telehealth: Payer: Self-pay | Admitting: Student

## 2019-03-08 NOTE — Telephone Encounter (Signed)
New message   Per [employer] need to know the name of provider that the patient is seeing for the next office visit. Please call.

## 2019-03-14 DIAGNOSIS — Z03818 Encounter for observation for suspected exposure to other biological agents ruled out: Secondary | ICD-10-CM | POA: Diagnosis not present

## 2019-03-27 DIAGNOSIS — Z23 Encounter for immunization: Secondary | ICD-10-CM | POA: Diagnosis not present

## 2019-03-27 DIAGNOSIS — Z79899 Other long term (current) drug therapy: Secondary | ICD-10-CM | POA: Diagnosis not present

## 2019-03-27 DIAGNOSIS — G301 Alzheimer's disease with late onset: Secondary | ICD-10-CM | POA: Diagnosis not present

## 2019-03-27 DIAGNOSIS — E7800 Pure hypercholesterolemia, unspecified: Secondary | ICD-10-CM | POA: Diagnosis not present

## 2019-03-27 DIAGNOSIS — E039 Hypothyroidism, unspecified: Secondary | ICD-10-CM | POA: Diagnosis not present

## 2019-03-27 DIAGNOSIS — F0280 Dementia in other diseases classified elsewhere without behavioral disturbance: Secondary | ICD-10-CM | POA: Diagnosis not present

## 2019-03-29 DIAGNOSIS — R319 Hematuria, unspecified: Secondary | ICD-10-CM | POA: Diagnosis not present

## 2019-04-05 ENCOUNTER — Encounter: Payer: Medicare Other | Admitting: Student

## 2019-04-05 NOTE — Progress Notes (Signed)
Electrophysiology Office Note Date: 04/05/2019  ID:  Laurie Klein, DOB 1931-06-07, MRN [id number]  PCP: Lajean Manes, MD Primary Cardiologist: No primary care provider on file. Electrophysiologist: Dr. Lovena Le  CC: Pacemaker follow-up  Laurie Klein is a 85 y.o. female seen today for Dr. Lovena Le for follow up of PPM due to SND and h/o syncope. she presents today for routine electrophysiology followup.  Since last being seen in our clinic, the patient reports doing very well.  she denies chest pain, palpitations, dyspnea, PND, orthopnea, nausea, vomiting, dizziness, syncope, edema, weight gain, or early satiety.  Device History: Biotronik Dual Chamber PPM implanted 03/27/2018 for SND/syncope  Past Medical History:  Diagnosis Date  . Anxiety   . Chronic headaches    history of  . DDD (degenerative disc disease) 05/24/2013  . Degenerative disc disease   . Dyslipidemia   . Headache(784.0): chronic 05/24/2013  . Heart palpitations   . Hypothyroidism   . Mitral valve prolapse   . MVP (mitral valve prolapse) 05/24/2013  . PAC (premature atrial contraction)   . Presence of permanent cardiac pacemaker 03/27/2018  . Spinal stenosis   . TMJ syndrome    Past Surgical History:  Procedure Laterality Date  . APPENDECTOMY    . BACK SURGERY    . BREAST BIOPSY  3 times  . BREAST EXCISIONAL BIOPSY Right 1970   benign  . BREAST EXCISIONAL BIOPSY Left 1950   2 benign  . BREAST EXCISIONAL BIOPSY Right 1950   benign  . CHOLECYSTECTOMY    . LOOP RECORDER INSERTION N/A 01/17/2018   Procedure: LOOP RECORDER INSERTION;  Surgeon: Evans Lance, MD;  Location: [facility];  Service: Cardiovascular;  Laterality: N/A;  . LOOP RECORDER REMOVAL N/A 03/27/2018   Procedure: LOOP RECORDER REMOVAL;  Surgeon: Evans Lance, MD;  Location: [place];  Service: Cardiovascular;  Laterality: N/A;  . LUMBAR MICRODISCECTOMY    . PACEMAKER IMPLANT N/A 03/27/2018   Procedure: PACEMAKER  IMPLANT;  Surgeon: Evans Lance, MD;  Location: [place] [facility];  Service: Cardiovascular;  Laterality: N/A;  . TONSILLECTOMY      Current Outpatient Medications  Medication Sig Dispense Refill  . acetaminophen (TYLENOL) 325 MG tablet Take 2 tablets (650 mg total) by mouth every 4 (four) hours as needed for mild pain or fever. 30 tablet 2  . Calcium Citrate (CITRACAL PO) Take 1 tablet by mouth daily.     . cholecalciferol (VITAMIN D) 1000 UNITS tablet Take 1,000 Units by mouth daily.     Marland Kitchen co-enzyme Q-10 30 MG capsule Take 100 mg by mouth daily.     Marland Kitchen donepezil (ARICEPT) 10 MG tablet Take 10 mg by mouth at bedtime.    . fish oil-omega-3 fatty acids 1000 MG capsule Take 2 g by mouth daily.     . Multiple Vitamin (MULTIVITAMIN) capsule Take 1 capsule by mouth daily.    Marland Kitchen neomycin-polymyxin-hydrocortisone (CORTISPORIN) OTIC solution Apply 1-2 drops to right great toe covered with bandaid every other day 10 mL 0  . polyethylene glycol (MIRALAX / GLYCOLAX) packet Take 17 g by mouth daily as needed for mild constipation. 14 each 0  . simvastatin (ZOCOR) 20 MG tablet Take 20 mg by mouth every evening.    Marland Kitchen SYNTHROID 75 MCG tablet Take 75 mcg by mouth daily.    Marland Kitchen tetrahydrozoline (VISINE) 0.05 % ophthalmic solution Place 3 drops into both eyes daily as needed (For dry eyes).  No current facility-administered medications for this visit.     Allergies:   Acrylic polymer, Lipitor [atorvastatin calcium], Orudis kt, Sulfa antibiotics, and Amoxicillin   Social History: Social History   Socioeconomic History  . Marital status: Widowed    Spouse name: Not on file  . Number of children: Not on file  . Years of education: Not on file  . Highest education level: Not on file  Occupational History  . Occupation: retired Transport planner  . Financial resource strain: Not on file  . Food insecurity    Worry: Not on file    Inability: Not on file  . Transportation needs    Medical:  Not on file    Non-medical: Not on file  Tobacco Use  . Smoking status: Former Smoker    Packs/day: 0.50    Types: Cigarettes    Quit date: 10/03/1973    Years since quitting: 45.5  . Smokeless tobacco: Never Used  Substance and Sexual Activity  . Alcohol use: Yes    Alcohol/week: 3.0 standard drinks    Types: 3 Standard drinks or equivalent per week    Comment: 4 glasses wine per week  . Drug use: No  . Sexual activity: Not on file  Lifestyle  . Physical activity    Days per week: Not on file    Minutes per session: Not on file  . Stress: Not on file  Relationships  . Social Herbalist on phone: Not on file    Gets together: Not on file    Attends religious service: Not on file    Active member of club or organization: Not on file    Attends meetings of clubs or organizations: Not on file    Relationship status: Not on file  . Intimate partner violence    Fear of current or ex partner: Not on file    Emotionally abused: Not on file    Physically abused: Not on file    Forced sexual activity: Not on file  Other Topics Concern  . Not on file  Social History Narrative  . Not on file    Family History: Family History  Problem Relation Age of Onset  . Heart failure Mother 26  . Stroke Mother   . Heart disease Father   . Melanoma Child      Review of Systems: All other systems reviewed and are otherwise negative except as noted above.  Physical Exam: Vitals:   04/06/19 1232  BP: 132/80  Pulse: 80  SpO2: 96%  Weight: 126 lb (57.2 kg)  Height: 5' (1.524 m)     GEN- The patient is well appearing, alert and oriented x 3 today.   HEENT: normocephalic, atraumatic; sclera clear, conjunctiva pink; hearing intact; oropharynx clear; neck supple  Lungs- Clear to ausculation bilaterally, normal work of breathing.  No wheezes, rales, rhonchi Heart- Regular rate and rhythm, no murmurs, rubs or gallops  GI- soft, non-tender, non-distended, bowel sounds present   Extremities- no clubbing, cyanosis, or edema  MS- no significant deformity or atrophy Skin- warm and dry, no rash or lesion; PPM pocket well healed Psych- euthymic mood, full affect Neuro- strength and sensation are intact  PPM Interrogation- reviewed in detail today,  See PACEART report  EKG:  EKG is ordered today. The ekg ordered today shows NSR 66 bpm  Recent Labs: No results found for requested labs within last 8760 hours.   Wt Readings from Last 3 Encounters:  07/03/18 131 lb (59.4 kg)  03/26/18 125 lb 14.1 oz (57.1 kg)  03/24/18 124 lb (56.2 kg)     Other studies Reviewed: Additional studies/ records that were reviewed today include: Echo 03/2018 shows LVEF 60-65%, Previous EP office notes, Previous remote checks, Most recent labwork.   Assessment and Plan:  1.  SND s/p Biotronik PPM  Normal PPM function See Pace Art report No changes today  2. Syncope No further since PPM placed  Current medicines are reviewed at length with the patient today.   The patient does not have concerns regarding her medicines.  The following changes were made today:  none  Labs/ tests ordered today include:  She saw PCP last week. Will request labs.   Disposition:   Follow up with Dr. Lovena Le in 12 months  Signed, Shirley Friar, PA-C  04/05/2019 2:04 PM  [place] [address] [place] [id number] [phone] (office) [phone] (fax)

## 2019-04-06 ENCOUNTER — Ambulatory Visit (INDEPENDENT_AMBULATORY_CARE_PROVIDER_SITE_OTHER): Payer: Medicare Other | Admitting: Student

## 2019-04-06 ENCOUNTER — Other Ambulatory Visit: Payer: Self-pay

## 2019-04-06 ENCOUNTER — Telehealth: Payer: Self-pay

## 2019-04-06 ENCOUNTER — Encounter: Payer: Self-pay | Admitting: Student

## 2019-04-06 VITALS — BP 132/80 | HR 80 | Ht 60.0 in | Wt 126.0 lb

## 2019-04-06 DIAGNOSIS — I491 Atrial premature depolarization: Secondary | ICD-10-CM

## 2019-04-06 LAB — CUP PACEART INCLINIC DEVICE CHECK
Date Time Interrogation Session: 20201002133030
Implantable Lead Implant Date: 20190923
Implantable Lead Implant Date: 20190923
Implantable Lead Location: 753859
Implantable Lead Location: 753860
Implantable Lead Model: 377
Implantable Lead Model: 377
Implantable Lead Serial Number: 80773242
Implantable Lead Serial Number: 80830530
Implantable Pulse Generator Implant Date: 20190923
Lead Channel Impedance Value: 507 Ohm
Lead Channel Impedance Value: 565 Ohm
Lead Channel Pacing Threshold Amplitude: 0.6 V
Lead Channel Pacing Threshold Amplitude: 0.6 V
Lead Channel Pacing Threshold Amplitude: 0.6 V
Lead Channel Pacing Threshold Amplitude: 1 V
Lead Channel Pacing Threshold Amplitude: 1 V
Lead Channel Pacing Threshold Amplitude: 1 V
Lead Channel Pacing Threshold Pulse Width: 0.4 ms
Lead Channel Pacing Threshold Pulse Width: 0.4 ms
Lead Channel Pacing Threshold Pulse Width: 0.4 ms
Lead Channel Pacing Threshold Pulse Width: 0.4 ms
Lead Channel Pacing Threshold Pulse Width: 0.4 ms
Lead Channel Pacing Threshold Pulse Width: 0.4 ms
Lead Channel Sensing Intrinsic Amplitude: 4.4 mV
Lead Channel Sensing Intrinsic Amplitude: 4.4 mV
Lead Channel Sensing Intrinsic Amplitude: 7.8 mV
Lead Channel Sensing Intrinsic Amplitude: 7.8 mV
Lead Channel Setting Pacing Amplitude: 2 V
Lead Channel Setting Pacing Amplitude: 2 V
Lead Channel Setting Pacing Pulse Width: 0.4 ms
Pulse Gen Model: 407145
Pulse Gen Serial Number: 69439851

## 2019-04-06 NOTE — Patient Instructions (Addendum)
Medication Instructions:  none If you need a refill on your cardiac medications before your next appointment, please call your pharmacy.   Lab work: none If you have labs (blood work) drawn today and your tests are completely normal, you will receive your results only by: Marland Kitchen MyChart Message (if you have MyChart) OR . A paper copy in the mail If you have any lab test that is abnormal or we need to change your treatment, we will call you to review the results.  Testing/Procedures: none  Follow-Up: 1 Year with Dr Lovena Le At [facility], you and your health needs are our priority.  As part of our continuing mission to provide you with exceptional heart care, we have created designated Provider Care Teams.  These Care Teams include your primary Cardiologist (physician) and Advanced Practice Providers (APPs -  Physician Assistants and Nurse Practitioners) who all work together to provide you with the care you need, when you need it. .   Any Other Special Instructions Will Be Listed Below (If Applicable). Remote monitoring is used to monitor your Pacemaker  from home. This monitoring reduces the number of office visits required to check your device to one time per year. It allows [place] to keep an eye on the functioning of your device to ensure it is working properly. You are scheduled for a device check from home on 07/09/2019. You may send your transmission at any time that day. If you have a wireless device, the transmission will be sent automatically. After your physician reviews your transmission, you will receive a postcard with your next transmission date.

## 2019-04-06 NOTE — Telephone Encounter (Signed)
I called Dr Carlyle Lipa office to request labs drawn on patient.  I had them fax to [phone].

## 2019-05-22 ENCOUNTER — Encounter: Payer: Self-pay | Admitting: Sports Medicine

## 2019-05-22 ENCOUNTER — Ambulatory Visit (INDEPENDENT_AMBULATORY_CARE_PROVIDER_SITE_OTHER): Payer: Medicare Other | Admitting: Sports Medicine

## 2019-05-22 ENCOUNTER — Other Ambulatory Visit: Payer: Self-pay

## 2019-05-22 DIAGNOSIS — M79675 Pain in left toe(s): Secondary | ICD-10-CM

## 2019-05-22 DIAGNOSIS — F0150 Vascular dementia without behavioral disturbance: Secondary | ICD-10-CM | POA: Diagnosis not present

## 2019-05-22 DIAGNOSIS — B351 Tinea unguium: Secondary | ICD-10-CM | POA: Diagnosis not present

## 2019-05-22 DIAGNOSIS — M79674 Pain in right toe(s): Secondary | ICD-10-CM | POA: Diagnosis not present

## 2019-05-22 DIAGNOSIS — M79609 Pain in unspecified limb: Secondary | ICD-10-CM | POA: Diagnosis not present

## 2019-05-22 NOTE — Progress Notes (Signed)
Subjective: Laurie Klein is a 85 y.o. female patient seen today in office with complaint of mildly painful thickened and elongated toenails; unable to trim. Patient had nail procedure on right earlier this year with a little pain at the on the medial corner of the right sometimes when a shoe. Patient has no other pedal complaints at this time.   Patient Active Problem List   Diagnosis Date Noted  . Closed left ankle fracture 03/28/2018  . Leukocytosis 03/27/2018  . Symptomatic bradycardia 03/25/2018  . Dementia ([place]) 03/25/2018  . Contusion of right periocular region   . Great toe pain, left   . Syncope 04/19/2017  . Syncope and collapse 05/24/2013  . MVP (mitral valve prolapse) 05/24/2013  . Anxiety 05/24/2013  . Dyslipidemia 05/24/2013  . Hypothyroidism 05/24/2013  . Headache(784.0): chronic 05/24/2013  . Spinal stenosis 05/24/2013  . PAC (premature atrial contraction) 05/24/2013  . DDD (degenerative disc disease) 05/24/2013    Current Outpatient Medications on File Prior to Visit  Medication Sig Dispense Refill  . acetaminophen (TYLENOL) 325 MG tablet Take 2 tablets (650 mg total) by mouth every 4 (four) hours as needed for mild pain or fever. 30 tablet 2  . Calcium Citrate (CITRACAL PO) Take 1 tablet by mouth daily.     . cholecalciferol (VITAMIN D) 1000 UNITS tablet Take 1,000 Units by mouth daily.     Marland Kitchen co-enzyme Q-10 30 MG capsule Take 100 mg by mouth daily.     Marland Kitchen donepezil (ARICEPT) 10 MG tablet Take 10 mg by mouth at bedtime.    . Multiple Vitamin (MULTIVITAMIN) capsule Take 1 capsule by mouth daily.    Marland Kitchen neomycin-polymyxin-hydrocortisone (CORTISPORIN) OTIC solution Apply 1-2 drops to right great toe covered with bandaid every other day 10 mL 0  . polyethylene glycol (MIRALAX / GLYCOLAX) packet Take 17 g by mouth daily as needed for mild constipation. 14 each 0  . simvastatin (ZOCOR) 20 MG tablet Take 20 mg by mouth every evening.    Marland Kitchen SYNTHROID 75 MCG tablet Take 75 mcg by  mouth daily.    Marland Kitchen tetrahydrozoline (VISINE) 0.05 % ophthalmic solution Place 3 drops into both eyes daily as needed (For dry eyes).      No current facility-administered medications on file prior to visit.     Allergies  Allergen Reactions  . Acrylic Polymer Other (See Comments)    Blisters inside mouth   . Lipitor [Atorvastatin Calcium] Other (See Comments)    Myalgias and muscle aches   . Orudis Kt Other (See Comments)    Unknown   . Sulfa Antibiotics Itching  . Amoxicillin Itching and Rash    Has patient had a PCN reaction causing immediate rash, facial/tongue/throat swelling, SOB or lightheadedness with hypotension: Yes Has patient had a PCN reaction causing severe rash involving mucus membranes or skin necrosis: No Has patient had a PCN reaction that required hospitalization: No Has patient had a PCN reaction occurring within the last 10 years: No If all of the above answers are "NO", then may proceed with Cephalosporin use.     Objective: Physical Exam  General: Well developed, nourished, no acute distress, awake, alert and oriented x 3  Vascular: Dorsalis pedis artery 1/4 bilateral, Posterior tibial artery 1/4 bilateral, skin temperature warm to warm proximal to distal bilateral lower extremities, mild varicosities, pedal hair present bilateral.  Neurological: Gross sensation present via light touch bilateral.   Dermatological: Skin is warm, dry, and supple bilateral, Nails 1-10 are tender,  long, thick, and discolored with mild subungal debris, Right great toenail has grown back out with dry skin at nail folds no signs of infection, no webspace macerations present bilateral, no open lesions present bilateral, no callus/corns/hyperkeratotic tissue present bilateral. No signs of infection bilateral.  Musculoskeletal: No symptomatic boney deformities noted bilateral. Muscular strength within normal limits without painon range of motion. No pain with calf compression  bilateral.  Assessment and Plan:  Problem List Items Addressed This Visit      Nervous and Auditory   Dementia ([place])    Other Visit Diagnoses    Pain due to onychomycosis of nail    -  Primary   Toe pain, bilateral         -Examined patient.  -Discussed treatment options for painful mycotic nails. -Mechanically debrided and reduced mycotic nails with sterile nail nipper and dremel nail file without incident. -Applied antibiotic cream after curttaging dry skin at nail fold on right and advised patient that this should continue to help her toe pain if worsens to return to office sooner  -Patient to return in 3 months for follow up evaluation or sooner if symptoms worsen.  Landis Martins, DPM

## 2019-07-09 ENCOUNTER — Ambulatory Visit (INDEPENDENT_AMBULATORY_CARE_PROVIDER_SITE_OTHER): Payer: Medicare Other | Admitting: *Deleted

## 2019-07-09 DIAGNOSIS — R55 Syncope and collapse: Secondary | ICD-10-CM

## 2019-07-09 LAB — CUP PACEART REMOTE DEVICE CHECK
Date Time Interrogation Session: 20210104082213
Implantable Lead Implant Date: 20190923
Implantable Lead Implant Date: 20190923
Implantable Lead Location: 753859
Implantable Lead Location: 753860
Implantable Lead Model: 377
Implantable Lead Model: 377
Implantable Lead Serial Number: 80773242
Implantable Lead Serial Number: 80830530
Implantable Pulse Generator Implant Date: 20190923
Pulse Gen Model: 407145
Pulse Gen Serial Number: 69439851

## 2019-08-28 ENCOUNTER — Encounter: Payer: Self-pay | Admitting: Sports Medicine

## 2019-08-28 ENCOUNTER — Other Ambulatory Visit: Payer: Self-pay

## 2019-08-28 ENCOUNTER — Ambulatory Visit (INDEPENDENT_AMBULATORY_CARE_PROVIDER_SITE_OTHER): Payer: Medicare Other | Admitting: Sports Medicine

## 2019-08-28 DIAGNOSIS — M79609 Pain in unspecified limb: Secondary | ICD-10-CM

## 2019-08-28 DIAGNOSIS — F0150 Vascular dementia without behavioral disturbance: Secondary | ICD-10-CM

## 2019-08-28 DIAGNOSIS — B351 Tinea unguium: Secondary | ICD-10-CM | POA: Diagnosis not present

## 2019-08-28 NOTE — Progress Notes (Signed)
Subjective: Laurie Klein is a 86 y.o. female patient seen today in office with complaint of mildly painful thickened and elongated toenails; unable to trim. Patient has no other pedal complaints at this time.   Patient is assisted by facility nurse.   Patient Active Problem List   Diagnosis Date Noted  . Closed left ankle fracture 03/28/2018  . Leukocytosis 03/27/2018  . Symptomatic bradycardia 03/25/2018  . Dementia ([place]) 03/25/2018  . Contusion of right periocular region   . Great toe pain, left   . Syncope 04/19/2017  . Syncope and collapse 05/24/2013  . MVP (mitral valve prolapse) 05/24/2013  . Anxiety 05/24/2013  . Dyslipidemia 05/24/2013  . Hypothyroidism 05/24/2013  . Headache(784.0): chronic 05/24/2013  . Spinal stenosis 05/24/2013  . PAC (premature atrial contraction) 05/24/2013  . DDD (degenerative disc disease) 05/24/2013    Current Outpatient Medications on File Prior to Visit  Medication Sig Dispense Refill  . acetaminophen (TYLENOL) 325 MG tablet Take 2 tablets (650 mg total) by mouth every 4 (four) hours as needed for mild pain or fever. 30 tablet 2  . Calcium Citrate (CITRACAL PO) Take 1 tablet by mouth daily.     . cholecalciferol (VITAMIN D) 1000 UNITS tablet Take 1,000 Units by mouth daily.     Marland Kitchen co-enzyme Q-10 30 MG capsule Take 100 mg by mouth daily.     Marland Kitchen donepezil (ARICEPT) 10 MG tablet Take 10 mg by mouth at bedtime.    . Multiple Vitamin (MULTIVITAMIN) capsule Take 1 capsule by mouth daily.    Marland Kitchen neomycin-polymyxin-hydrocortisone (CORTISPORIN) OTIC solution Apply 1-2 drops to right great toe covered with bandaid every other day 10 mL 0  . polyethylene glycol (MIRALAX / GLYCOLAX) packet Take 17 g by mouth daily as needed for mild constipation. 14 each 0  . simvastatin (ZOCOR) 20 MG tablet Take 20 mg by mouth every evening.    Marland Kitchen SYNTHROID 75 MCG tablet Take 75 mcg by mouth daily.    Marland Kitchen tetrahydrozoline (VISINE) 0.05 % ophthalmic solution Place 3 drops into  both eyes daily as needed (For dry eyes).      No current facility-administered medications on file prior to visit.    Allergies  Allergen Reactions  . Acrylic Polymer Other (See Comments)    Blisters inside mouth   . Lipitor [Atorvastatin Calcium] Other (See Comments)    Myalgias and muscle aches   . Orudis Kt Other (See Comments)    Unknown   . Sulfa Antibiotics Itching  . Amoxicillin Itching and Rash    Has patient had a PCN reaction causing immediate rash, facial/tongue/throat swelling, SOB or lightheadedness with hypotension: Yes Has patient had a PCN reaction causing severe rash involving mucus membranes or skin necrosis: No Has patient had a PCN reaction that required hospitalization: No Has patient had a PCN reaction occurring within the last 10 years: No If all of the above answers are "NO", then may proceed with Cephalosporin use.     Objective: Physical Exam  General: Well developed, nourished, no acute distress, awake, alert and oriented x 3  Vascular: Dorsalis pedis artery 1/4 bilateral, Posterior tibial artery 1/4 bilateral, skin temperature warm to warm proximal to distal bilateral lower extremities, mild varicosities, pedal hair present bilateral.  Neurological: Gross sensation present via light touch bilateral.   Dermatological: Skin is warm, dry, and supple bilateral, Nails 1-10 are tender, long, thick, and discolored with mild subungal debris, Right great toenail well healed at 1st toenail bed at previous  procedure, no webspace macerations present bilateral, no open lesions present bilateral, no callus/corns/hyperkeratotic tissue present bilateral. No signs of infection bilateral.  Musculoskeletal: No symptomatic boney deformities noted bilateral. Muscular strength within normal limits without painon range of motion. No pain with calf compression bilateral.  Assessment and Plan:  Problem List Items Addressed This Visit      Nervous and Auditory   Dementia  ([place])    Other Visit Diagnoses    Pain due to onychomycosis of nail    -  Primary     -Examined patient.  -Discussed treatment options for painful mycotic nails. -Mechanically debrided and reduced mycotic nails with sterile nail nipper and dremel nail file without incident.  -Patient to return in 3 months for follow up evaluation or sooner if symptoms worsen.  Landis Martins, DPM

## 2019-09-27 DIAGNOSIS — Z0000 Encounter for general adult medical examination without abnormal findings: Secondary | ICD-10-CM | POA: Diagnosis not present

## 2019-09-27 DIAGNOSIS — Z79899 Other long term (current) drug therapy: Secondary | ICD-10-CM | POA: Diagnosis not present

## 2019-09-27 DIAGNOSIS — F0280 Dementia in other diseases classified elsewhere without behavioral disturbance: Secondary | ICD-10-CM | POA: Diagnosis not present

## 2019-09-27 DIAGNOSIS — Z1389 Encounter for screening for other disorder: Secondary | ICD-10-CM | POA: Diagnosis not present

## 2019-09-27 DIAGNOSIS — E039 Hypothyroidism, unspecified: Secondary | ICD-10-CM | POA: Diagnosis not present

## 2019-09-27 DIAGNOSIS — E7800 Pure hypercholesterolemia, unspecified: Secondary | ICD-10-CM | POA: Diagnosis not present

## 2019-09-27 DIAGNOSIS — G301 Alzheimer's disease with late onset: Secondary | ICD-10-CM | POA: Diagnosis not present

## 2019-10-08 ENCOUNTER — Ambulatory Visit (INDEPENDENT_AMBULATORY_CARE_PROVIDER_SITE_OTHER): Payer: Medicare Other | Admitting: *Deleted

## 2019-10-08 DIAGNOSIS — R55 Syncope and collapse: Secondary | ICD-10-CM | POA: Diagnosis not present

## 2019-10-08 LAB — CUP PACEART REMOTE DEVICE CHECK
Date Time Interrogation Session: 20210405074946
Implantable Lead Implant Date: 20190923
Implantable Lead Implant Date: 20190923
Implantable Lead Location: 753859
Implantable Lead Location: 753860
Implantable Lead Model: 377
Implantable Lead Model: 377
Implantable Lead Serial Number: 80773242
Implantable Lead Serial Number: 80830530
Implantable Pulse Generator Implant Date: 20190923
Pulse Gen Model: 407145
Pulse Gen Serial Number: 69439851

## 2019-10-09 NOTE — Progress Notes (Signed)
PPM Remote  

## 2019-10-18 DIAGNOSIS — R4789 Other speech disturbances: Secondary | ICD-10-CM | POA: Diagnosis not present

## 2019-10-18 DIAGNOSIS — F0390 Unspecified dementia without behavioral disturbance: Secondary | ICD-10-CM | POA: Diagnosis not present

## 2019-10-22 DIAGNOSIS — F0390 Unspecified dementia without behavioral disturbance: Secondary | ICD-10-CM | POA: Diagnosis not present

## 2019-10-22 DIAGNOSIS — R4789 Other speech disturbances: Secondary | ICD-10-CM | POA: Diagnosis not present

## 2019-10-26 DIAGNOSIS — F0390 Unspecified dementia without behavioral disturbance: Secondary | ICD-10-CM | POA: Diagnosis not present

## 2019-10-26 DIAGNOSIS — R4789 Other speech disturbances: Secondary | ICD-10-CM | POA: Diagnosis not present

## 2019-10-29 DIAGNOSIS — R4789 Other speech disturbances: Secondary | ICD-10-CM | POA: Diagnosis not present

## 2019-10-29 DIAGNOSIS — F0390 Unspecified dementia without behavioral disturbance: Secondary | ICD-10-CM | POA: Diagnosis not present

## 2019-10-31 DIAGNOSIS — F0390 Unspecified dementia without behavioral disturbance: Secondary | ICD-10-CM | POA: Diagnosis not present

## 2019-10-31 DIAGNOSIS — R4789 Other speech disturbances: Secondary | ICD-10-CM | POA: Diagnosis not present

## 2019-11-02 DIAGNOSIS — R4789 Other speech disturbances: Secondary | ICD-10-CM | POA: Diagnosis not present

## 2019-11-02 DIAGNOSIS — F0390 Unspecified dementia without behavioral disturbance: Secondary | ICD-10-CM | POA: Diagnosis not present

## 2019-11-18 IMAGING — CR DG LUMBAR SPINE COMPLETE 4+V
5 series · 5 of 5 positions shown · non-contrast
Comparison: Abdomen and pelvis CT 09/24/2017

CLINICAL DATA: Fall.

EXAM:
LUMBAR SPINE - COMPLETE 4+ VIEW

[t lumbar spine ap]
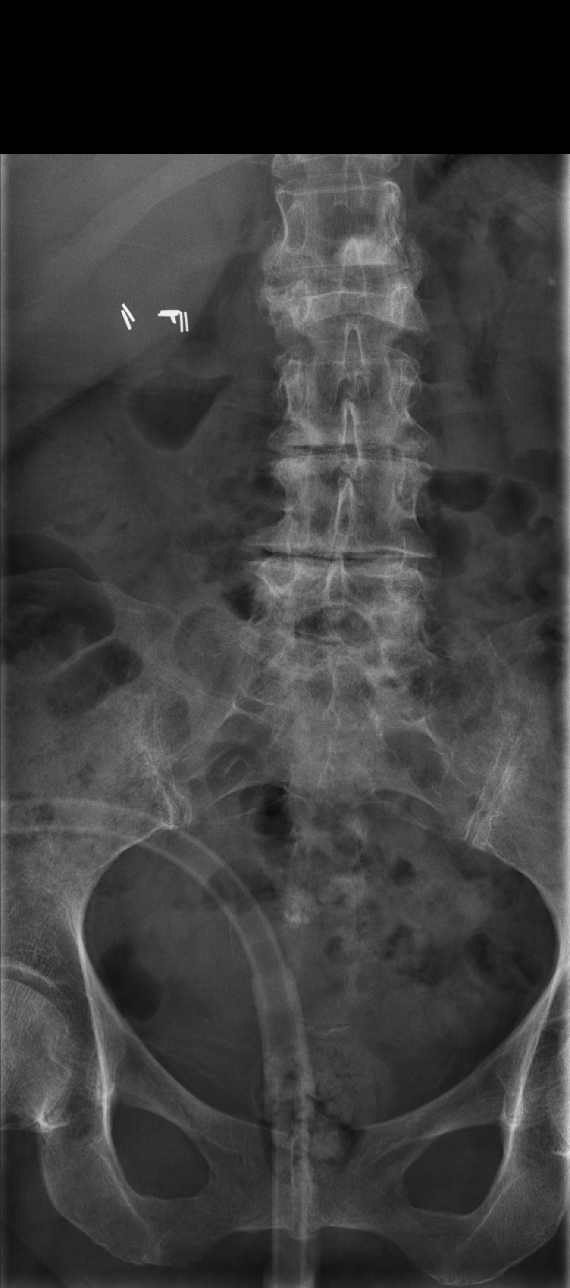

[t lumbar spine obl (1 of 2)]
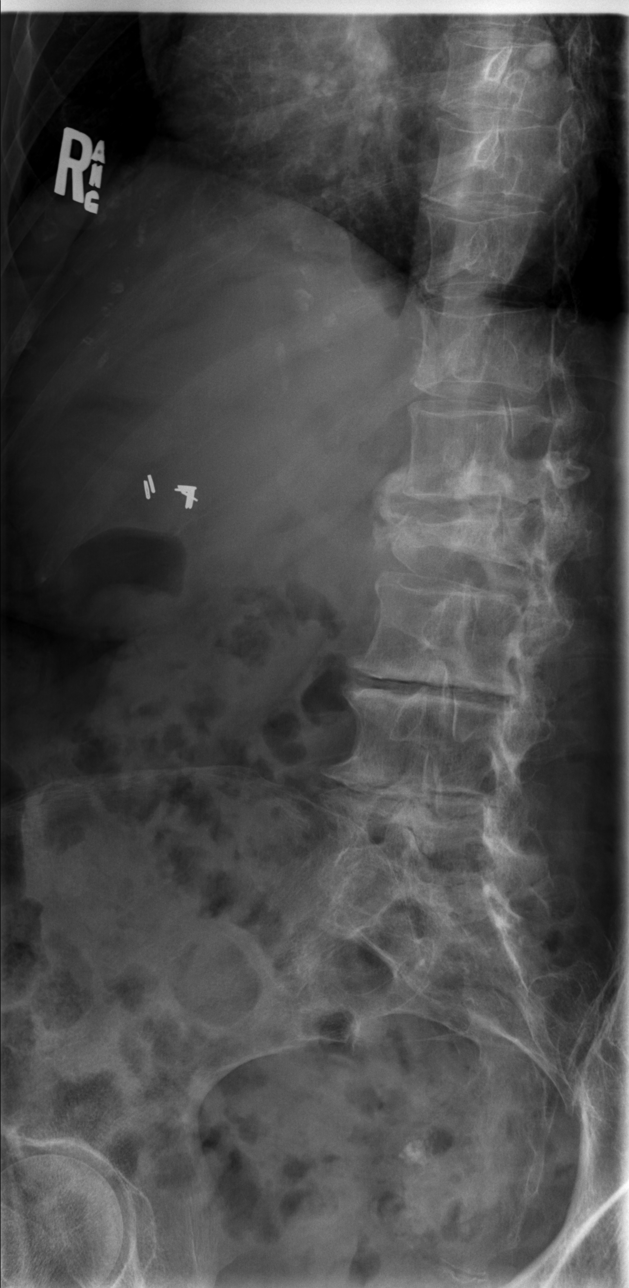

[t lumbar spine obl (2 of 2)]
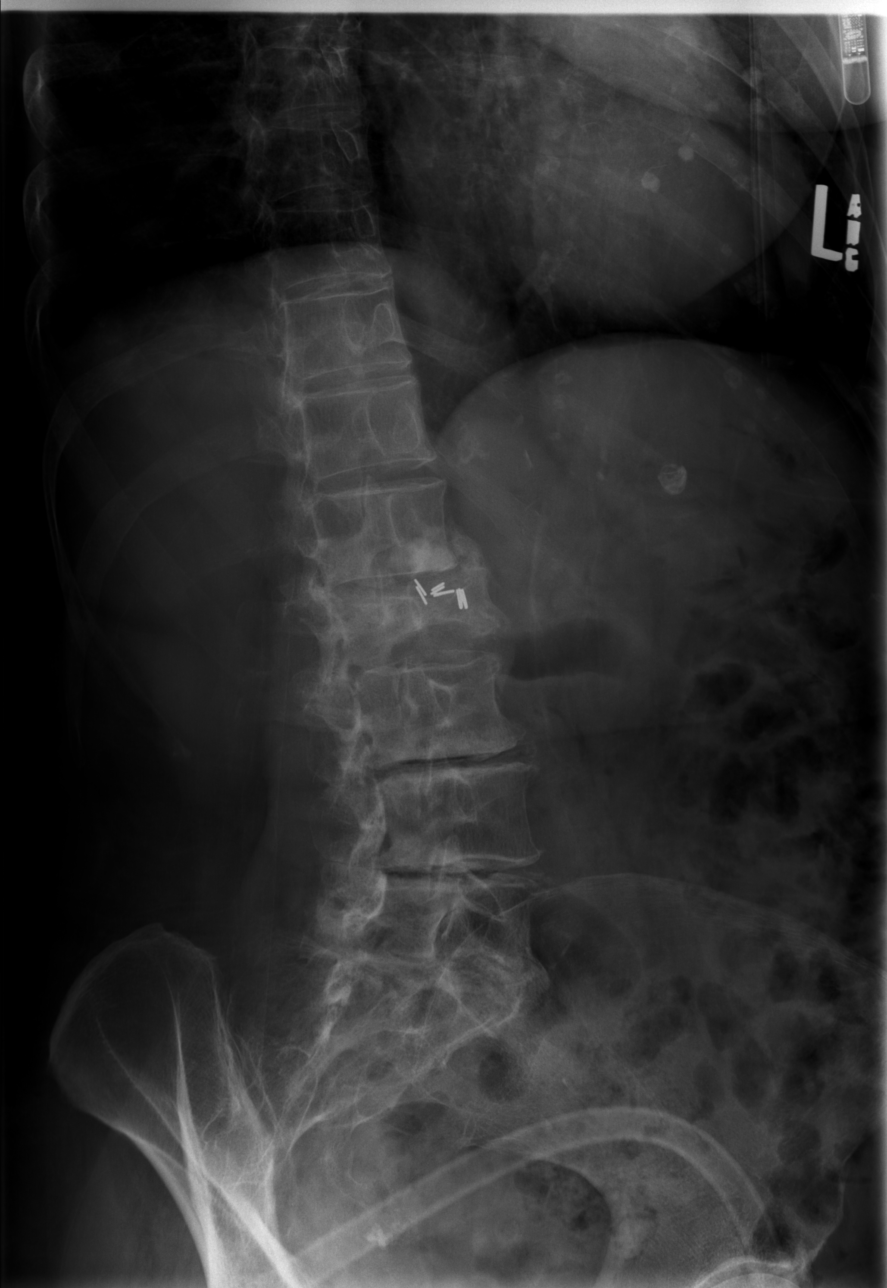

[t lumbar spine lat]
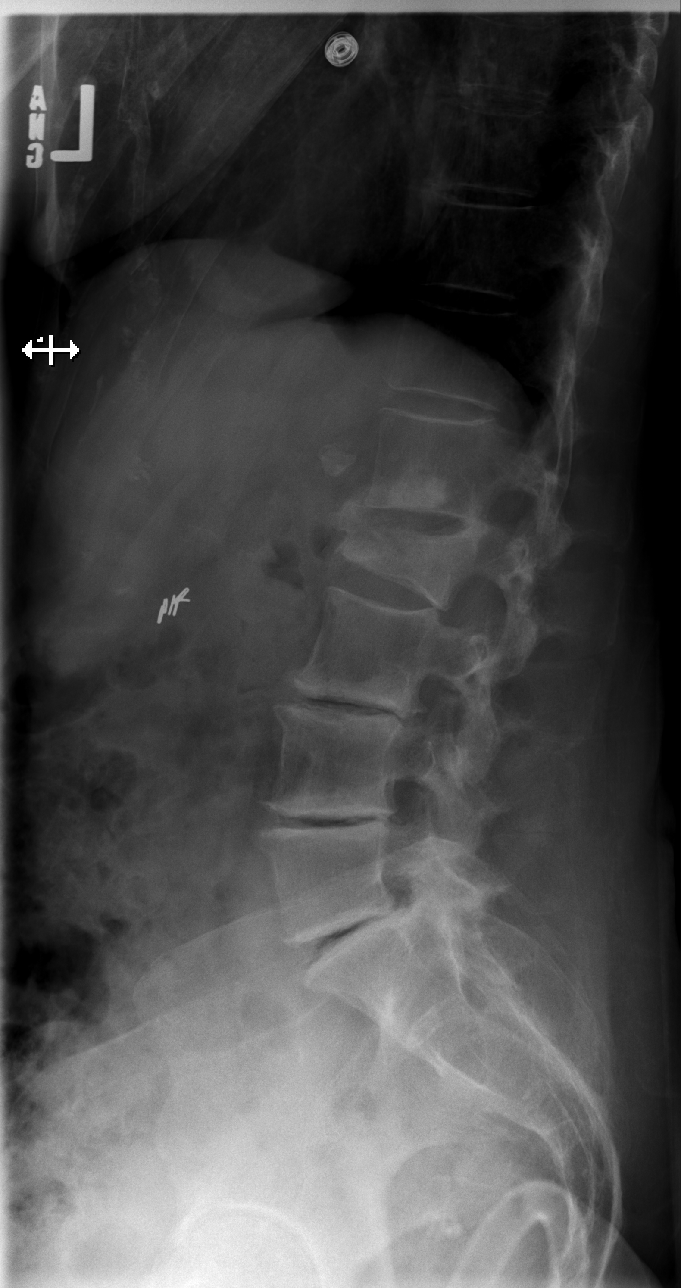

[t lumbar l-5 s-1 spot]
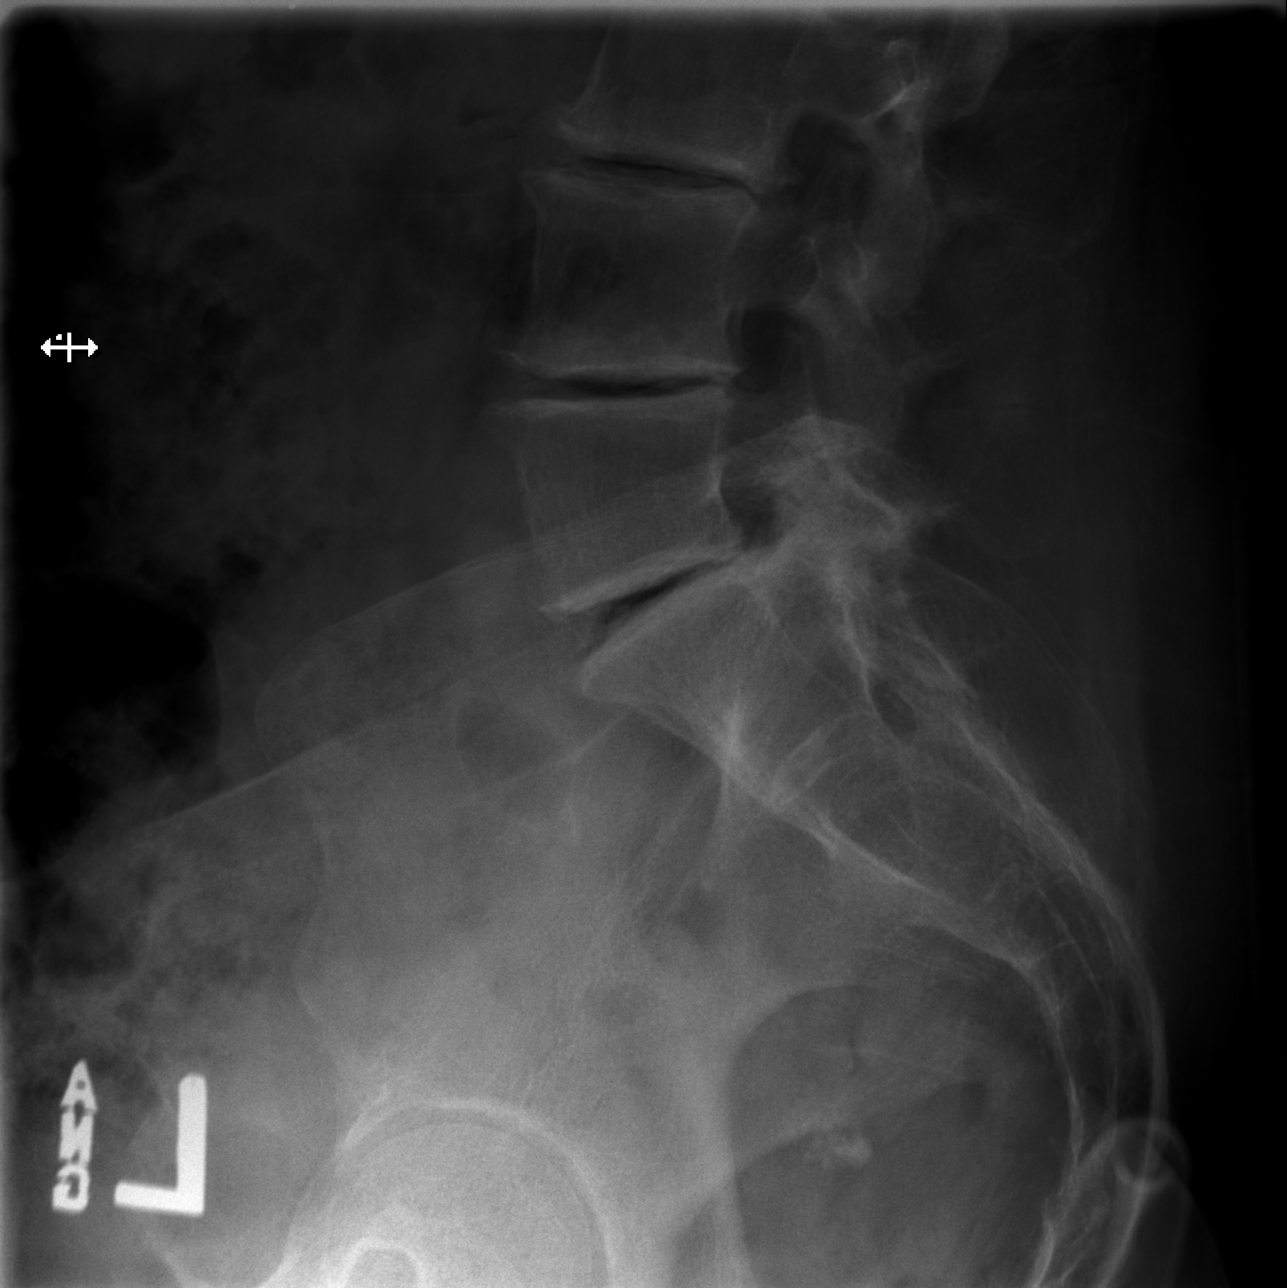

[5 of 5 positions shown; findings below may reference images not displayed]

FINDINGS: L2 compression fracture present since prior CT of 09/24/2017
although there may be some or interval loss of vertebral body
height. Sclerotic focus in the L1 vertebral body is stable.
Degenerative disc disease at L3-4, L4-5, and L5-S1 is similar to
prior. SI joints are unremarkable. Bones are diffusely
demineralized.
IMPRESSION: L2 compression fracture with probable loss of height since
09/24/2017.

Degenerative disc disease in the lower lumbar spine without evidence
of new fracture.

## 2019-11-18 IMAGING — CR DG HIP (WITH OR WITHOUT PELVIS) 2-3V*R*
3 series · 3 of 3 positions shown · non-contrast
Comparison: None.

CLINICAL DATA: Right hip pain following a fall today.

EXAM:
DG HIP (WITH OR WITHOUT PELVIS) 2-3V RIGHT

[t pelvis ap]
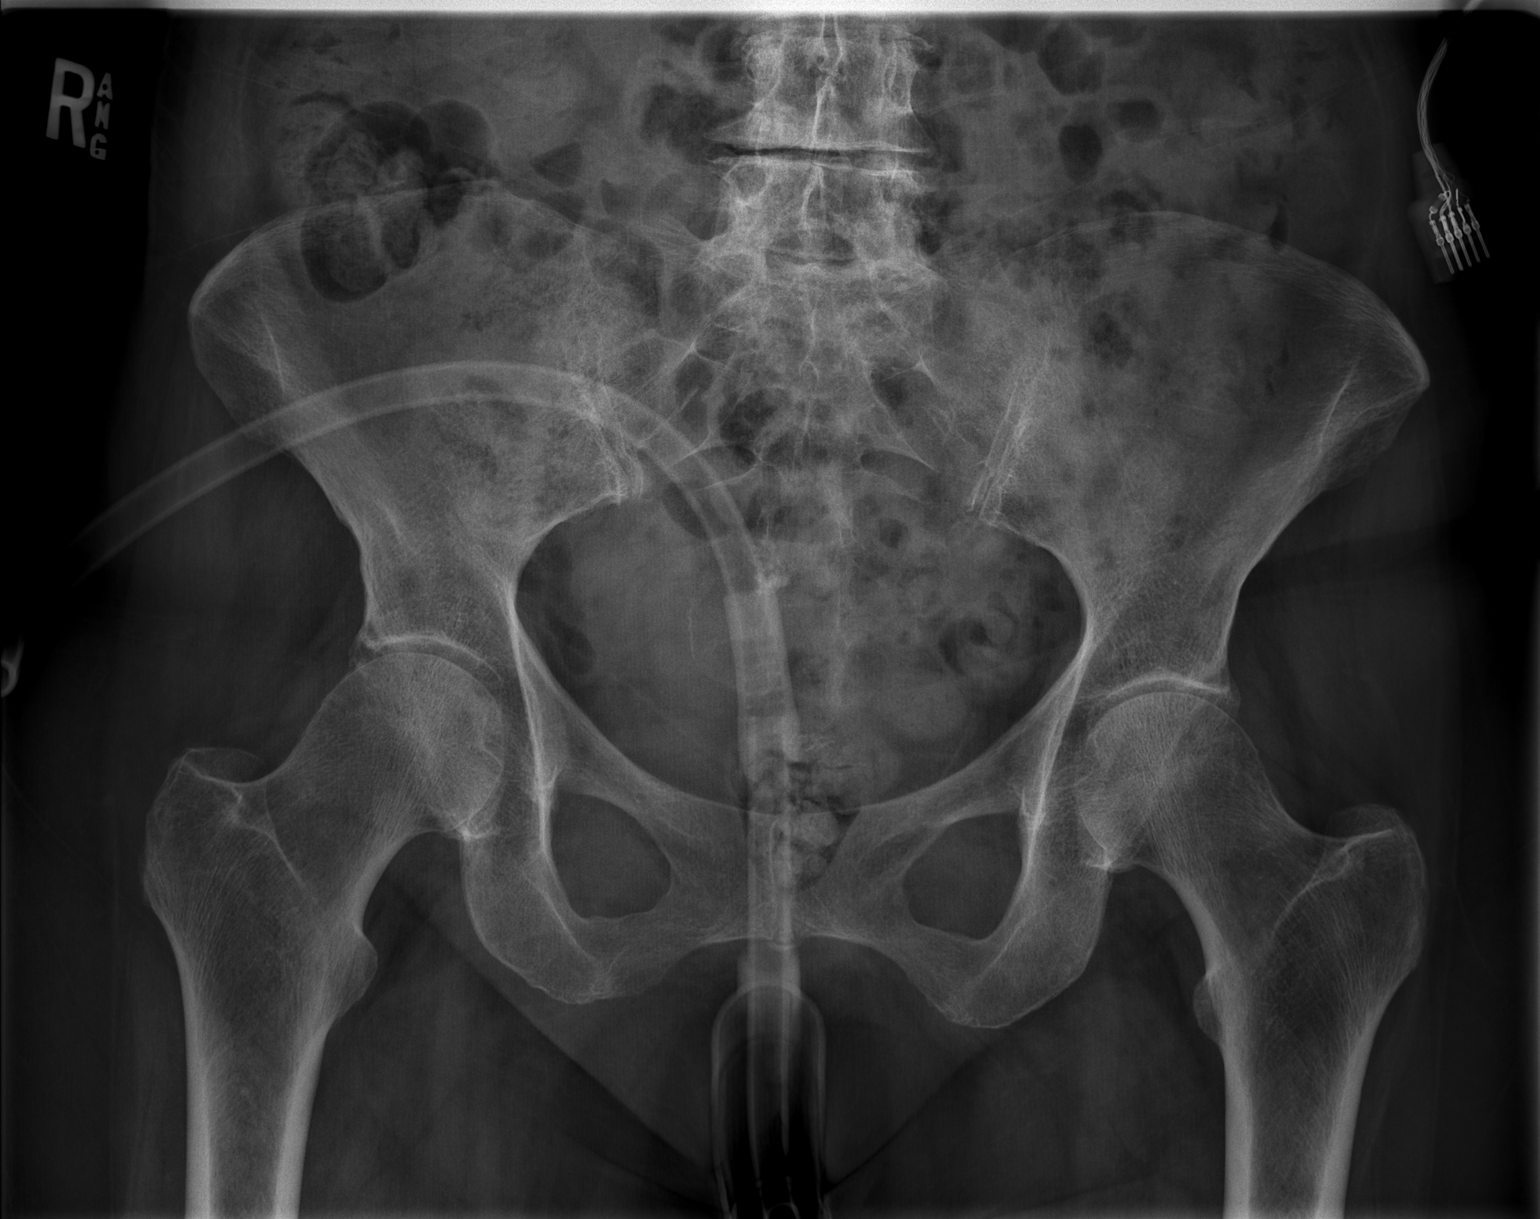

[t hip ap right]
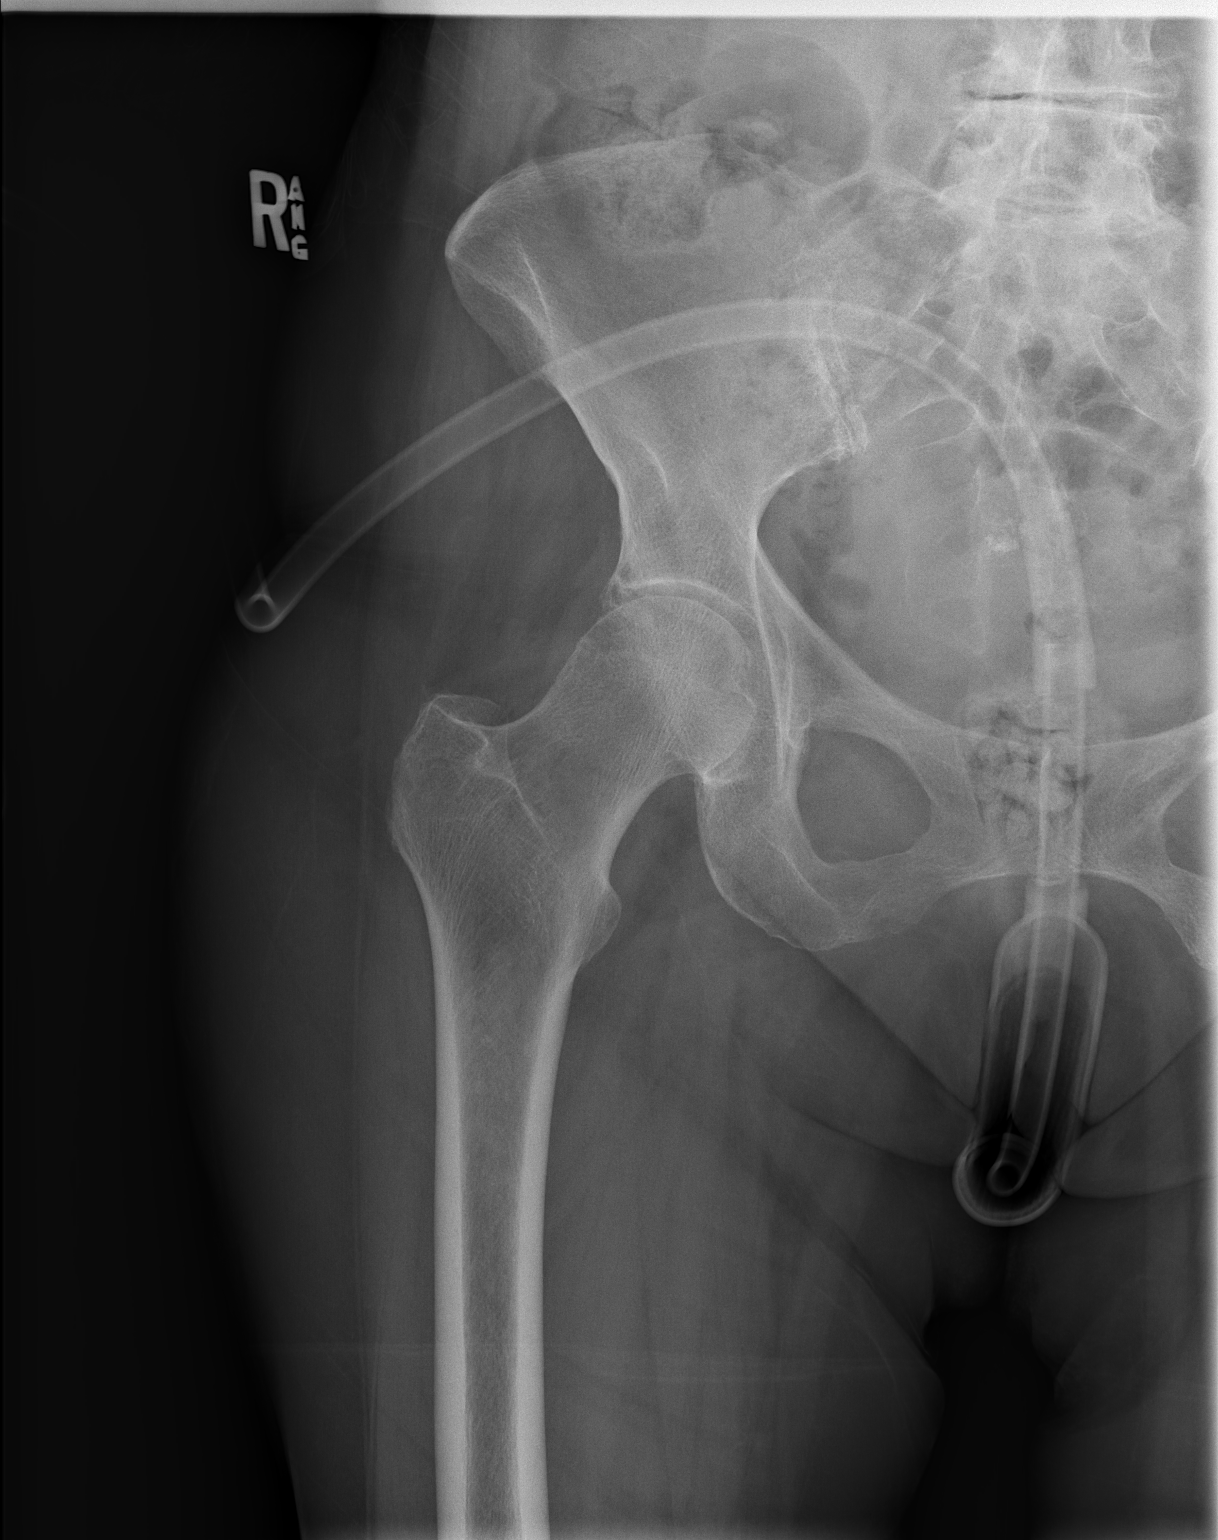

[w hip lat right]
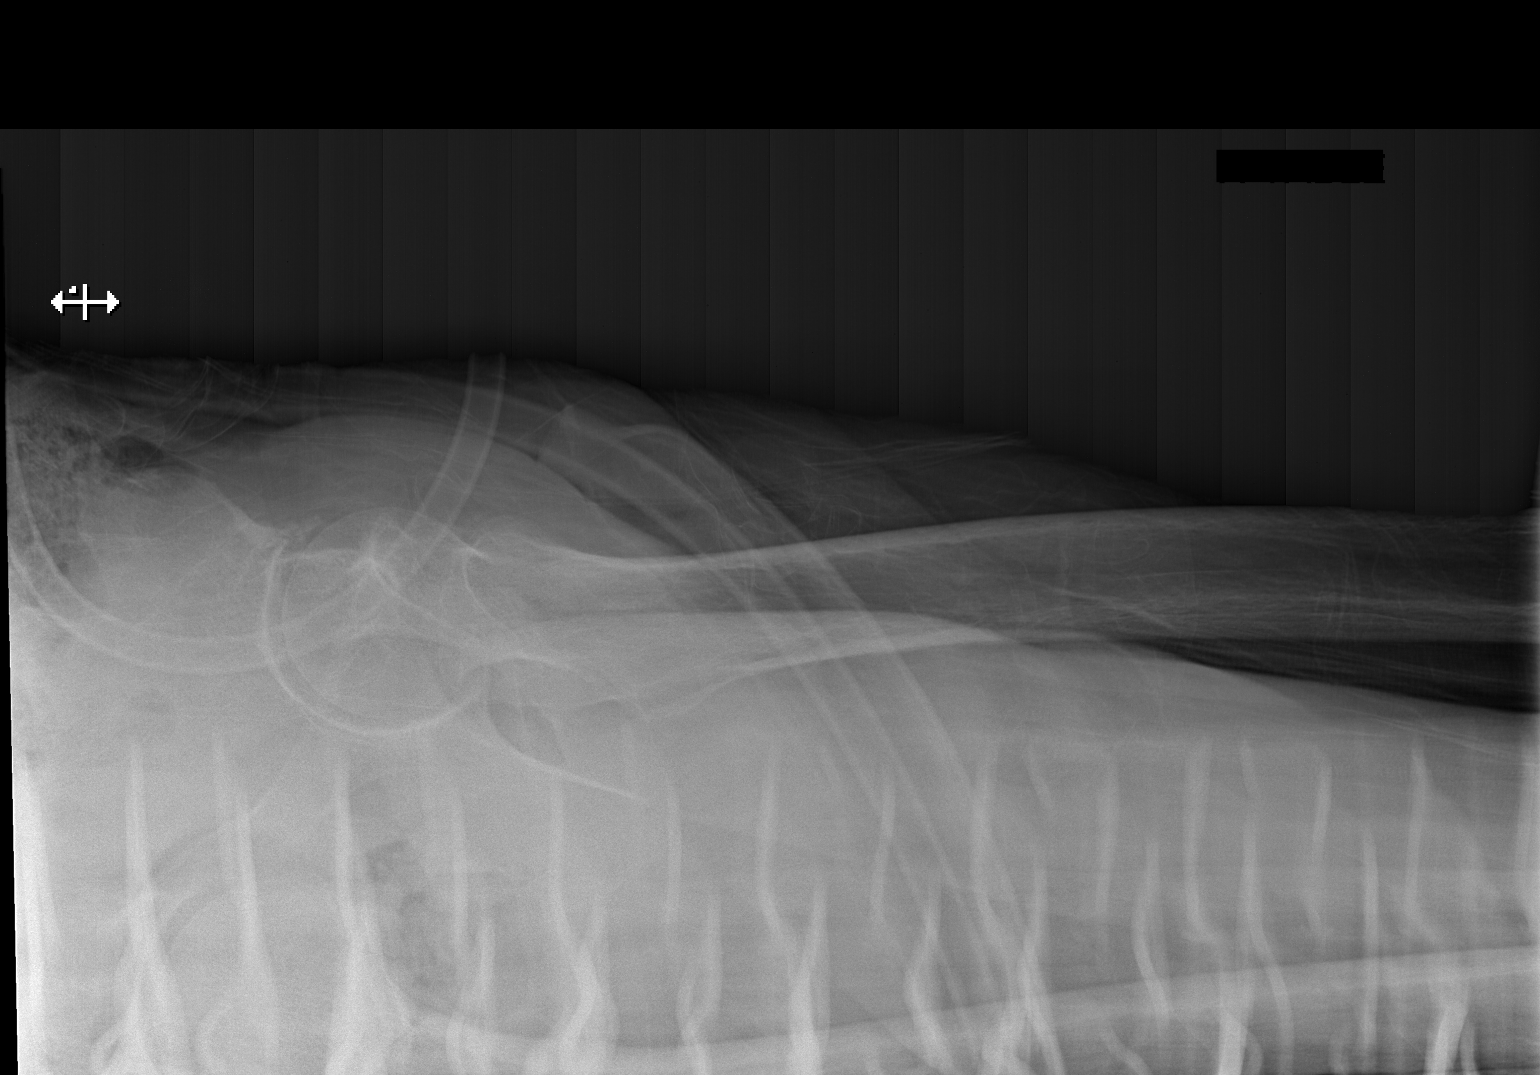

[3 of 3 positions shown; findings below may reference images not displayed]

FINDINGS: Normal appearing right hip without fracture or dislocation. Lower
lumbar spine degenerative changes.
IMPRESSION: No fracture or dislocation. Lower lumbar spine degenerative changes.

## 2019-11-27 ENCOUNTER — Ambulatory Visit: Payer: Medicare Other | Admitting: Sports Medicine

## 2019-12-11 ENCOUNTER — Encounter: Payer: Self-pay | Admitting: Sports Medicine

## 2019-12-11 ENCOUNTER — Other Ambulatory Visit: Payer: Self-pay

## 2019-12-11 ENCOUNTER — Ambulatory Visit (INDEPENDENT_AMBULATORY_CARE_PROVIDER_SITE_OTHER): Payer: Medicare Other | Admitting: Sports Medicine

## 2019-12-11 VITALS — Temp 97.3°F

## 2019-12-11 DIAGNOSIS — M79676 Pain in unspecified toe(s): Secondary | ICD-10-CM

## 2019-12-11 DIAGNOSIS — B351 Tinea unguium: Secondary | ICD-10-CM

## 2019-12-11 DIAGNOSIS — F0150 Vascular dementia without behavioral disturbance: Secondary | ICD-10-CM

## 2019-12-12 ENCOUNTER — Encounter: Payer: Self-pay | Admitting: Sports Medicine

## 2019-12-12 NOTE — Progress Notes (Signed)
Subjective: Laurie Klein is a 86 y.o. female patient seen today in office with complaint of mildly painful thickened and elongated toenails; unable to trim.  Currently using walker and denies any issues with tripping or falling.  Patient has no other pedal complaints at this time.   Patient is assisted by facility nurse again this visit.   Patient Active Problem List   Diagnosis Date Noted  . Closed left ankle fracture 03/28/2018  . Leukocytosis 03/27/2018  . Symptomatic bradycardia 03/25/2018  . Dementia ([place]) 03/25/2018  . Contusion of right periocular region   . Great toe pain, left   . Syncope 04/19/2017  . Syncope and collapse 05/24/2013  . MVP (mitral valve prolapse) 05/24/2013  . Anxiety 05/24/2013  . Dyslipidemia 05/24/2013  . Hypothyroidism 05/24/2013  . Headache(784.0): chronic 05/24/2013  . Spinal stenosis 05/24/2013  . PAC (premature atrial contraction) 05/24/2013  . DDD (degenerative disc disease) 05/24/2013    Current Outpatient Medications on File Prior to Visit  Medication Sig Dispense Refill  . acetaminophen (TYLENOL) 325 MG tablet Take 2 tablets (650 mg total) by mouth every 4 (four) hours as needed for mild pain or fever. 30 tablet 2  . Calcium Citrate (CITRACAL PO) Take 1 tablet by mouth daily.     . cholecalciferol (VITAMIN D) 1000 UNITS tablet Take 1,000 Units by mouth daily.     Marland Kitchen co-enzyme Q-10 30 MG capsule Take 100 mg by mouth daily.     Marland Kitchen donepezil (ARICEPT) 10 MG tablet Take 10 mg by mouth at bedtime.    . Multiple Vitamin (MULTIVITAMIN) capsule Take 1 capsule by mouth daily.    Marland Kitchen neomycin-polymyxin-hydrocortisone (CORTISPORIN) OTIC solution Apply 1-2 drops to right great toe covered with bandaid every other day 10 mL 0  . polyethylene glycol (MIRALAX / GLYCOLAX) packet Take 17 g by mouth daily as needed for mild constipation. 14 each 0  . simvastatin (ZOCOR) 20 MG tablet Take 20 mg by mouth every evening.    Marland Kitchen SYNTHROID 75 MCG tablet Take 75 mcg by  mouth daily.    Marland Kitchen tetrahydrozoline (VISINE) 0.05 % ophthalmic solution Place 3 drops into both eyes daily as needed (For dry eyes).      No current facility-administered medications on file prior to visit.    Allergies  Allergen Reactions  . Acrylic Polymer Other (See Comments)    Blisters inside mouth   . Lipitor [Atorvastatin Calcium] Other (See Comments)    Myalgias and muscle aches   . Orudis Kt Other (See Comments)    Unknown   . Sulfa Antibiotics Itching  . Amoxicillin Itching and Rash    Has patient had a PCN reaction causing immediate rash, facial/tongue/throat swelling, SOB or lightheadedness with hypotension: Yes Has patient had a PCN reaction causing severe rash involving mucus membranes or skin necrosis: No Has patient had a PCN reaction that required hospitalization: No Has patient had a PCN reaction occurring within the last 10 years: No If all of the above answers are "NO", then may proceed with Cephalosporin use.     Objective: Physical Exam  General: Well developed, nourished, no acute distress, awake, alert and oriented x 3  Vascular: Dorsalis pedis artery 1/4 bilateral, Posterior tibial artery 1/4 bilateral, skin temperature warm to warm proximal to distal bilateral lower extremities, mild varicosities, pedal hair present bilateral.  Neurological: Gross sensation present via light touch bilateral.   Dermatological: Skin is warm, dry, and supple bilateral, Nails 1-10 are tender, long, thick, and  discolored with mild subungal debris, Right great toenail well healed at 1st toenail bed at previous procedure, no webspace macerations present bilateral, no open lesions present bilateral, no callus/corns/hyperkeratotic tissue present bilateral. No signs of infection bilateral.  Musculoskeletal: No symptomatic boney deformities noted bilateral. Muscular strength within normal limits without painon range of motion. No pain with calf compression bilateral.  Assessment and  Plan:  Problem List Items Addressed This Visit      Nervous and Auditory   Dementia ([place])    Other Visit Diagnoses    Pain due to onychomycosis of nail    -  Primary     -Examined patient.  -Discussed treatment options for painful mycotic nails. -Mechanically debrided and reduced mycotic nails with sterile nail nipper and dremel nail file without incident.  -Continue with walker for stability in gait -Patient to return in 3 months for follow up evaluation or sooner if symptoms worsen.  Landis Martins, DPM

## 2020-01-08 ENCOUNTER — Ambulatory Visit (INDEPENDENT_AMBULATORY_CARE_PROVIDER_SITE_OTHER): Payer: Medicare Other | Admitting: *Deleted

## 2020-01-08 DIAGNOSIS — R55 Syncope and collapse: Secondary | ICD-10-CM

## 2020-01-08 LAB — CUP PACEART REMOTE DEVICE CHECK
Date Time Interrogation Session: 20210706150043
Implantable Lead Implant Date: 20190923
Implantable Lead Implant Date: 20190923
Implantable Lead Location: 753859
Implantable Lead Location: 753860
Implantable Lead Model: 377
Implantable Lead Model: 377
Implantable Lead Serial Number: 80773242
Implantable Lead Serial Number: 80830530
Implantable Pulse Generator Implant Date: 20190923
Pulse Gen Model: 407145
Pulse Gen Serial Number: 69439851

## 2020-01-09 NOTE — Progress Notes (Signed)
Remote pacemaker transmission.   

## 2020-04-03 DIAGNOSIS — Z23 Encounter for immunization: Secondary | ICD-10-CM | POA: Diagnosis not present

## 2020-04-03 DIAGNOSIS — Z79899 Other long term (current) drug therapy: Secondary | ICD-10-CM | POA: Diagnosis not present

## 2020-04-03 DIAGNOSIS — E039 Hypothyroidism, unspecified: Secondary | ICD-10-CM | POA: Diagnosis not present

## 2020-04-03 DIAGNOSIS — G301 Alzheimer's disease with late onset: Secondary | ICD-10-CM | POA: Diagnosis not present

## 2020-04-03 DIAGNOSIS — E7800 Pure hypercholesterolemia, unspecified: Secondary | ICD-10-CM | POA: Diagnosis not present

## 2020-04-07 ENCOUNTER — Ambulatory Visit (INDEPENDENT_AMBULATORY_CARE_PROVIDER_SITE_OTHER): Payer: Medicare Other

## 2020-04-07 DIAGNOSIS — I495 Sick sinus syndrome: Secondary | ICD-10-CM | POA: Diagnosis not present

## 2020-04-08 LAB — CUP PACEART REMOTE DEVICE CHECK
Date Time Interrogation Session: 20211005085431
Implantable Lead Implant Date: 20190923
Implantable Lead Implant Date: 20190923
Implantable Lead Location: 753859
Implantable Lead Location: 753860
Implantable Lead Model: 377
Implantable Lead Model: 377
Implantable Lead Serial Number: 80773242
Implantable Lead Serial Number: 80830530
Implantable Pulse Generator Implant Date: 20190923
Pulse Gen Model: 407145
Pulse Gen Serial Number: 69439851

## 2020-04-09 NOTE — Progress Notes (Signed)
Remote pacemaker transmission.   

## 2020-04-11 DIAGNOSIS — Z23 Encounter for immunization: Secondary | ICD-10-CM | POA: Diagnosis not present

## 2020-04-12 ENCOUNTER — Encounter (HOSPITAL_COMMUNITY): Payer: Self-pay

## 2020-04-12 ENCOUNTER — Emergency Department (HOSPITAL_COMMUNITY): Payer: Medicare Other

## 2020-04-12 ENCOUNTER — Emergency Department (HOSPITAL_COMMUNITY)
Admission: EM | Admit: 2020-04-12 | Discharge: 2020-04-12 | Disposition: A | Discharge status: Home / Self Care | Payer: Medicare Other | Attending: Emergency Medicine | Admitting: Emergency Medicine

## 2020-04-12 ENCOUNTER — Emergency Department (HOSPITAL_COMMUNITY)
Admission: EM | Admit: 2020-04-12 | Discharge: 2020-04-13 | Disposition: A | Discharge status: Other Acute Inpatient Hospital | Payer: Medicare Other | Source: Home / Self Care | Attending: Emergency Medicine | Admitting: Emergency Medicine

## 2020-04-12 ENCOUNTER — Other Ambulatory Visit: Payer: Self-pay

## 2020-04-12 DIAGNOSIS — Z7401 Bed confinement status: Secondary | ICD-10-CM | POA: Diagnosis not present

## 2020-04-12 DIAGNOSIS — E039 Hypothyroidism, unspecified: Secondary | ICD-10-CM | POA: Insufficient documentation

## 2020-04-12 DIAGNOSIS — Z79890 Hormone replacement therapy (postmenopausal): Secondary | ICD-10-CM | POA: Insufficient documentation

## 2020-04-12 DIAGNOSIS — R410 Disorientation, unspecified: Secondary | ICD-10-CM | POA: Diagnosis not present

## 2020-04-12 DIAGNOSIS — Z043 Encounter for examination and observation following other accident: Secondary | ICD-10-CM | POA: Diagnosis not present

## 2020-04-12 DIAGNOSIS — W010XXA Fall on same level from slipping, tripping and stumbling without subsequent striking against object, initial encounter: Secondary | ICD-10-CM | POA: Insufficient documentation

## 2020-04-12 DIAGNOSIS — Z87891 Personal history of nicotine dependence: Secondary | ICD-10-CM | POA: Insufficient documentation

## 2020-04-12 DIAGNOSIS — F0390 Unspecified dementia without behavioral disturbance: Secondary | ICD-10-CM | POA: Diagnosis not present

## 2020-04-12 DIAGNOSIS — W19XXXA Unspecified fall, initial encounter: Secondary | ICD-10-CM

## 2020-04-12 DIAGNOSIS — Y92002 Bathroom of unspecified non-institutional (private) residence single-family (private) house as the place of occurrence of the external cause: Secondary | ICD-10-CM | POA: Insufficient documentation

## 2020-04-12 DIAGNOSIS — M2550 Pain in unspecified joint: Secondary | ICD-10-CM | POA: Diagnosis not present

## 2020-04-12 DIAGNOSIS — Z950 Presence of cardiac pacemaker: Secondary | ICD-10-CM | POA: Insufficient documentation

## 2020-04-12 DIAGNOSIS — S0990XA Unspecified injury of head, initial encounter: Secondary | ICD-10-CM | POA: Diagnosis not present

## 2020-04-12 DIAGNOSIS — R404 Transient alteration of awareness: Secondary | ICD-10-CM | POA: Diagnosis not present

## 2020-04-12 DIAGNOSIS — S0101XA Laceration without foreign body of scalp, initial encounter: Secondary | ICD-10-CM | POA: Insufficient documentation

## 2020-04-12 MED ORDER — LEVOTHYROXINE SODIUM 50 MCG PO TABS: 75.0000 ug | ORAL_TABLET | Freq: Every day | ORAL | Status: DC

## 2020-04-12 MED ORDER — DONEPEZIL HCL 5 MG PO TABS
10.0000 mg | ORAL_TABLET | Freq: Every day | ORAL | Status: DC
  Administered 2020-04-12: 10 mg via ORAL
  Filled 2020-04-12: qty 2

## 2020-04-12 MED ORDER — SIMVASTATIN 20 MG PO TABS
20.0000 mg | ORAL_TABLET | Freq: Every evening | ORAL | Status: DC
  Administered 2020-04-12: 20 mg via ORAL
  Filled 2020-04-12: qty 1

## 2020-04-12 NOTE — ED Provider Notes (Signed)
11:35 PM Assumed care from Dr. Tomi Bamberger, please see their note for full history, physical and decision making until this point. In brief this is a 86 y.o. year old female who presented to the ED tonight with No chief complaint on file.     Patient seen in the emergency room earlier today and apparently went back to the facility.  They did not have a one-to-one caregiver like she supposed to have an was thus sent back to the emergency room.  Patient without issues over night. TOC consult already placed. Can be discharged when safe.   Labs, studies and imaging reviewed by myself and considered in medical decision making if ordered. Imaging interpreted by radiology.  Labs Reviewed - No data to display  No orders to display    No follow-ups on file.    Laurie Klein, Corene Cornea, MD 04/13/20 [phone]

## 2020-04-12 NOTE — ED Provider Notes (Addendum)
[facility] DEPT Provider Note   CSN: [id number] Arrival date & time: 04/12/20  1549     History Chief Complaint  Patient presents with  . Fall    Laurina Fischl Dotzler is a 86 y.o. female  History of dementia  [facility]  Witnessed mechanical fall ambulating in bathroom and tripped over feet per staff  No syncope  No LOC, anticoagulation  Ambulatory after fall  [place] at baseline  [place] up to date  Patient without meaningful conversation at baseline, will repeat questions   Level 5 caveat-- dementia  Attempted to contact living facility for collateral however unable to reach facility.  Collateral obtained from [facility] at [phone] heard from facility the patient had a mechanical fall earlier today.  States patient is at her baseline mentation here today from what I have described  HPI     Past Medical History:  Diagnosis Date  . Anxiety   . Chronic headaches    history of  . DDD (degenerative disc disease) 05/24/2013  . Degenerative disc disease   . Dyslipidemia   . Headache(784.0): chronic 05/24/2013  . Heart palpitations   . Hypothyroidism   . Mitral valve prolapse   . MVP (mitral valve prolapse) 05/24/2013  . PAC (premature atrial contraction)   . Presence of permanent cardiac pacemaker 03/27/2018  . Spinal stenosis   . TMJ syndrome     Patient Active Problem List   Diagnosis Date Noted  . Closed left ankle fracture 03/28/2018  . Leukocytosis 03/27/2018  . Symptomatic bradycardia 03/25/2018  . Dementia ([place]) 03/25/2018  . Contusion of right periocular region   . Great toe pain, left   . Syncope 04/19/2017  . Syncope and collapse 05/24/2013  . MVP (mitral valve prolapse) 05/24/2013  . Anxiety 05/24/2013  . Dyslipidemia 05/24/2013  . Hypothyroidism 05/24/2013  . Headache(784.0): chronic 05/24/2013  . Spinal stenosis 05/24/2013  . PAC (premature atrial contraction) 05/24/2013  . DDD  (degenerative disc disease) 05/24/2013    Past Surgical History:  Procedure Laterality Date  . APPENDECTOMY    . BACK SURGERY    . BREAST BIOPSY  3 times  . BREAST EXCISIONAL BIOPSY Right 1970   benign  . BREAST EXCISIONAL BIOPSY Left 1950   2 benign  . BREAST EXCISIONAL BIOPSY Right 1950   benign  . CHOLECYSTECTOMY    . LOOP RECORDER INSERTION N/A 01/17/2018   Procedure: LOOP RECORDER INSERTION;  Surgeon: Evans Lance, MD;  Location: [facility];  Service: Cardiovascular;  Laterality: N/A;  . LOOP RECORDER REMOVAL N/A 03/27/2018   Procedure: LOOP RECORDER REMOVAL;  Surgeon: Evans Lance, MD;  Location: [facility];  Service: Cardiovascular;  Laterality: N/A;  . LUMBAR MICRODISCECTOMY    . PACEMAKER IMPLANT N/A 03/27/2018   Procedure: PACEMAKER IMPLANT;  Surgeon: Evans Lance, MD;  Location: [facility];  Service: Cardiovascular;  Laterality: N/A;  . TONSILLECTOMY       OB History   No obstetric history on file.     Family History  Problem Relation Age of Onset  . Heart failure Mother 69  . Stroke Mother   . Heart disease Father   . Melanoma Child     Social History   Tobacco Use  . Smoking status: Former Smoker    Packs/day: 0.50    Types: Cigarettes    Quit date: 10/03/1973    Years since quitting: 46.5  . Smokeless tobacco: Never Used  Vaping Use  . Vaping Use: Never used  Substance Use Topics  . Alcohol use: Yes    Alcohol/week: 3.0 standard drinks    Types: 3 Standard drinks or equivalent per week    Comment: 4 glasses wine per week  . Drug use: No    Home Medications Prior to Admission medications   Medication Sig Start Date End Date Taking? Authorizing Provider  acetaminophen (TYLENOL) 325 MG tablet Take 2 tablets (650 mg total) by mouth every 4 (four) hours as needed for mild pain or fever. 03/29/18   Roxan Hockey, MD  Calcium Citrate (CITRACAL PO) Take 1 tablet by mouth daily.     [provider]    cholecalciferol (VITAMIN D) 1000 UNITS tablet Take 1,000 Units by mouth daily.     [provider]  co-enzyme Q-10 30 MG capsule Take 100 mg by mouth daily.     [provider]  donepezil (ARICEPT) 10 MG tablet Take 10 mg by mouth at bedtime.    [provider]  Multiple Vitamin (MULTIVITAMIN) capsule Take 1 capsule by mouth daily.    [provider]  neomycin-polymyxin-hydrocortisone (CORTISPORIN) OTIC solution Apply 1-2 drops to right great toe covered with bandaid every other day 11/28/18   Landis Martins, DPM  polyethylene glycol (MIRALAX / GLYCOLAX) packet Take 17 g by mouth daily as needed for mild constipation. 03/29/18   Roxan Hockey, MD  simvastatin (ZOCOR) 20 MG tablet Take 20 mg by mouth every evening.    [provider]  SYNTHROID 75 MCG tablet Take 75 mcg by mouth daily. 02/04/17   [provider]  tetrahydrozoline (VISINE) 0.05 % ophthalmic solution Place 3 drops into both eyes daily as needed (For dry eyes).     [provider]    Allergies    Acrylic polymer, Lipitor [atorvastatin calcium], Orudis kt, Sulfa antibiotics, and Amoxicillin  Review of Systems   Review of Systems  Unable to perform ROS: Dementia  Constitutional: Negative.   HENT: Negative.   Respiratory: Negative.   Cardiovascular: Negative.   Gastrointestinal: Negative.   Genitourinary: Negative.   Musculoskeletal: Negative.   Skin: Positive for wound.  Neurological: Negative.   All other systems reviewed and are negative.   Physical Exam Updated Vital Signs BP 133/60   Pulse 65   Temp (!) 97.5 F (36.4 C)   Resp 16   SpO2 96%   Physical Exam Vitals and nursing note reviewed.  Constitutional:      General: She is not in acute distress.    Appearance: She is well-developed. She is not ill-appearing, toxic-appearing or diaphoretic.  HENT:     Head: Normocephalic and atraumatic.     Nose: Nose normal.     Mouth/Throat:     Mouth:  Mucous membranes are moist.  Eyes:     Pupils: Pupils are equal, round, and reactive to light.  Cardiovascular:     Rate and Rhythm: Normal rate.     Pulses: Normal pulses.     Heart sounds: Normal heart sounds.  Pulmonary:     Effort: Pulmonary effort is normal. No respiratory distress.     Breath sounds: Normal breath sounds.  Chest:     Comments: No crepitus, step-offs.  Pacemaker incision without overlying erythema or warmth Abdominal:     General: Bowel sounds are normal. There is no distension.     Tenderness: There is no abdominal tenderness. There is no right CVA tenderness, left CVA tenderness or guarding.  Musculoskeletal:        General: Normal range of motion.     Cervical back: Normal range of motion.     Comments: No midline spinal tenderness, crepitus or step-offs.  Pelvis stable, nontender palpation.  No shortening or rotation of legs.  Able to flex and extend at bilateral knees as well as shoulders and elbows without difficulty.  Wiggles toes  Skin:    General: Skin is warm and dry.     Capillary Refill: Capillary refill takes less than 2 seconds.     Comments: 1 cm laceration to posterior right scalp.  No active bleeding or drainage  Neurological:     Mental Status: She is alert. Mental status is at baseline.     Comments: Mentation at baseline per facility Cranial nerves II through XII grossly intact Equal handgrip bilaterally Repeats questions when asked answers    ED Results / Procedures / Treatments   Labs (all labs ordered are listed, but only abnormal results are displayed) Labs Reviewed - No data to display  EKG None  Radiology DG Chest 2 View  Result Date: 04/12/2020 CLINICAL DATA:  Fall from standing position. Posterior head laceration. EXAM: CHEST - 2 VIEW COMPARISON:  Radiographs 03/28/2018.  Chest CT 09/20/2013. FINDINGS: Left subclavian pacemaker leads extend into the right atrium and right ventricle. The heart size and mediastinal contours are  stable. There is mild vascular congestion, but no overt pulmonary edema, confluent airspace opacity, pleural effusion or pneumothorax. No acute fractures are seen. IMPRESSION: No acute cardiopulmonary process or posttraumatic findings. Mild vascular congestion. Electronically Signed   By: Richardean Sale M.D.   On: 04/12/2020 17:16   DG Pelvis 1-2 Views  Result Date: 04/12/2020 CLINICAL DATA:  Fall from standing position. EXAM: PELVIS - 1-2 VIEW COMPARISON:  Pelvic CT 09/24/2017. FINDINGS: The bones appear adequately mineralized. No evidence of acute fracture or dislocation. There are mild degenerative changes of the hips, sacroiliac joints and lower lumbar spine. No evidence of femoral head avascular necrosis. Right pelvic calcification is unchanged from previous CT, consistent with a calcified exophytic fibroid or calcified adnexal lesion. The soft tissues appear unremarkable. IMPRESSION: No acute osseous findings. Mild degenerative changes. Electronically Signed   By: Richardean Sale M.D.   On: 04/12/2020 17:14   CT Head Wo Contrast  Result Date: 04/12/2020 CLINICAL DATA:  Fall in bathroom, nonverbal at baseline EXAM: CT HEAD WITHOUT CONTRAST CT CERVICAL SPINE WITHOUT CONTRAST TECHNIQUE: Multidetector CT imaging of the head and cervical spine was performed following the standard protocol without intravenous contrast. Multiplanar CT image reconstructions of the cervical spine were also generated. COMPARISON:  CT head and cervical spine 03/20/2018 FINDINGS: CT HEAD FINDINGS Brain: No evidence of acute infarction, hemorrhage, hydrocephalus, extra-axial collection, visible mass lesion or mass effect. Patchy areas of white matter hypoattenuation are most compatible with chronic microvascular angiopathy. Diffuse prominence of the ventricles and sulci, similar to prior. Some slight asymmetric ex vacuo dilatation of the ventricles is unchanged from comparison. Vascular: Atherosclerotic calcification of the  carotid siphons and intradural vertebral arteries. No hyperdense vessel. Skull: Some minimal right parietal scalp thickening without large crescentic hematoma or subjacent calvarial fracture. No acute osseous abnormality or suspicious osseous lesions. Sinuses/Orbits: Prior bilateral lens extractions with senescent scleral plaques. Included orbital contents are otherwise unremarkable. Paranasal sinuses and mastoid air cells are predominantly clear. Debris seen in the bilateral external auditory canals, right greater than left. Other: Mild bilateral TMJ arthrosis. CT CERVICAL SPINE FINDINGS Alignment: Stabilization collar  absent at the time of examination. Preservation of the normal cervical lordosis. Unchanged retrolisthesis C3 on 4, C5 on 6 and anterolisthesis C4 on 5. Findings favored to be on a spondylitic basis given disc desiccations and spondylitic endplate changes at these levels. No abnormally widened, jumped or perched facets. Craniocervical and atlantoaxial articulations are maintained accounting for mild rightward cranial rotation. Skull base and vertebrae: No acute skull base fracture. No vertebral body fracture or height loss. Normal bone mineralization. No worrisome osseous lesions. Soft tissues and spinal canal: No pre or paravertebral fluid or swelling. No visible canal hematoma. Disc levels: Multilevel disc osteophyte complexes, largest C3-4 result in partial effacement of ventral thecal sac without significant spinal canal impingement. Multilevel uncinate spurring and facet hypertrophic changes are present as well resulting in mild-to-moderate multilevel neural foraminal narrowing maximal on the left C5-6 and bilaterally C3-4. Upper chest: Biapical pleuroparenchymal scarring. Some interlobular septal thickening may be present suggestive of early interstitial edema. No other acute abnormality in the lung apices. Other: Diminutive appearance of the thyroid without concerning nodule or mass.  IMPRESSION: 1. No acute intracranial abnormality. 2. Stable atrophy and chronic microvascular ischemic white matter disease. 3. Minimal right parietal scalp thickening without large crescentic hematoma or subjacent calvarial fracture. 4. No acute cervical spine fracture or traumatic listhesis. 5. Multilevel degenerative changes of the cervical spine as described above. 6. Debris in the bilateral external auditory canals, right greater than left, correlate with visual inspection. 7. Some interlobular septal thickening may be present in the lung apices suggestive of early interstitial edema. Electronically Signed   By: Lovena Le M.D.   On: 04/12/2020 17:09   CT Cervical Spine Wo Contrast  Result Date: 04/12/2020 CLINICAL DATA:  Fall in bathroom, nonverbal at baseline EXAM: CT HEAD WITHOUT CONTRAST CT CERVICAL SPINE WITHOUT CONTRAST TECHNIQUE: Multidetector CT imaging of the head and cervical spine was performed following the standard protocol without intravenous contrast. Multiplanar CT image reconstructions of the cervical spine were also generated. COMPARISON:  CT head and cervical spine 03/20/2018 FINDINGS: CT HEAD FINDINGS Brain: No evidence of acute infarction, hemorrhage, hydrocephalus, extra-axial collection, visible mass lesion or mass effect. Patchy areas of white matter hypoattenuation are most compatible with chronic microvascular angiopathy. Diffuse prominence of the ventricles and sulci, similar to prior. Some slight asymmetric ex vacuo dilatation of the ventricles is unchanged from comparison. Vascular: Atherosclerotic calcification of the carotid siphons and intradural vertebral arteries. No hyperdense vessel. Skull: Some minimal right parietal scalp thickening without large crescentic hematoma or subjacent calvarial fracture. No acute osseous abnormality or suspicious osseous lesions. Sinuses/Orbits: Prior bilateral lens extractions with senescent scleral plaques. Included orbital contents are  otherwise unremarkable. Paranasal sinuses and mastoid air cells are predominantly clear. Debris seen in the bilateral external auditory canals, right greater than left. Other: Mild bilateral TMJ arthrosis. CT CERVICAL SPINE FINDINGS Alignment: Stabilization collar absent at the time of examination. Preservation of the normal cervical lordosis. Unchanged retrolisthesis C3 on 4, C5 on 6 and anterolisthesis C4 on 5. Findings favored to be on a spondylitic basis given disc desiccations and spondylitic endplate changes at these levels. No abnormally widened, jumped or perched facets. Craniocervical and atlantoaxial articulations are maintained accounting for mild rightward cranial rotation. Skull base and vertebrae: No acute skull base fracture. No vertebral body fracture or height loss. Normal bone mineralization. No worrisome osseous lesions. Soft tissues and spinal canal: No pre or paravertebral fluid or swelling. No visible canal hematoma. Disc levels: Multilevel disc osteophyte complexes, largest  C3-4 result in partial effacement of ventral thecal sac without significant spinal canal impingement. Multilevel uncinate spurring and facet hypertrophic changes are present as well resulting in mild-to-moderate multilevel neural foraminal narrowing maximal on the left C5-6 and bilaterally C3-4. Upper chest: Biapical pleuroparenchymal scarring. Some interlobular septal thickening may be present suggestive of early interstitial edema. No other acute abnormality in the lung apices. Other: Diminutive appearance of the thyroid without concerning nodule or mass. IMPRESSION: 1. No acute intracranial abnormality. 2. Stable atrophy and chronic microvascular ischemic white matter disease. 3. Minimal right parietal scalp thickening without large crescentic hematoma or subjacent calvarial fracture. 4. No acute cervical spine fracture or traumatic listhesis. 5. Multilevel degenerative changes of the cervical spine as described above.  6. Debris in the bilateral external auditory canals, right greater than left, correlate with visual inspection. 7. Some interlobular septal thickening may be present in the lung apices suggestive of early interstitial edema. Electronically Signed   By: Lovena Le M.D.   On: 04/12/2020 17:09    Procedures .Marland KitchenLaceration Repair  Date/Time: 04/12/2020 5:39 PM Performed by: Nettie Elm, PA-C Authorized by: Nettie Elm, PA-C   Consent:    Consent obtained:  Verbal   Consent given by:  Patient   Risks discussed:  Infection, need for additional repair, pain, poor cosmetic result and poor wound healing   Alternatives discussed:  No treatment and delayed treatment Universal protocol:    Procedure explained and questions answered to patient or proxy's satisfaction: yes     Relevant documents present and verified: yes     Test results available and properly labeled: yes     Imaging studies available: yes     Required blood products, implants, devices, and special equipment available: yes     Site/side marked: yes     Immediately prior to procedure, a time out was called: yes     Patient identity confirmed:  Verbally with patient Anesthesia (see MAR for exact dosages):    Anesthesia method:  None Laceration details:    Location:  Scalp   Scalp location:  R parietal   Length (cm):  1   Depth (mm):  3 Repair type:    Repair type:  Intermediate Pre-procedure details:    Preparation:  Patient was prepped and draped in usual sterile fashion and imaging obtained to evaluate for foreign bodies Exploration:    Hemostasis achieved with:  Direct pressure   Wound exploration: wound explored through full range of motion and entire depth of wound probed and visualized     Contaminated: no   Treatment:    Area cleansed with:  Betadine   Amount of cleaning:  Extensive   Irrigation solution:  Sterile saline Skin repair:    Repair method:  Tissue adhesive Approximation:     Approximation:  Close Post-procedure details:    Dressing:  Open (no dressing)   Patient tolerance of procedure:  Tolerated well, no immediate complications   (including critical care time)  Medications Ordered in ED Medications - No data to display  ED Course  I have reviewed the triage vital signs and the nursing notes.  Pertinent labs & imaging results that were available during my care of the patient were reviewed by me and considered in my medical decision making (see chart for details).  86 year old history of dementia without purposeful verbal communication presents for evaluation of witnessed mechanical fall at living facility.  1 similar laceration to posterior scalp.  She appears neurovascularly intact.  Per EMS patient at baseline mentation.  Neuromusculoskeletal exam with full range of motion.  Plan on imaging and reassess.  Imaging personally reviewed and interpreted  CT head without acute changes CT cervical without acute changes DG chest with mild cardiomegaly DG pelvis without acute fracture, dislocation  Patient reassessed.  Hair Apposition with Dermabond to close.  Patient ambulation without difficulty.  No evidence of acute fracture, dislocation acute intracranial abnormality.  NV intact, normal MSK exam.  Will dc back to facility  Ambulatory here, Tolerating PO   Discussed with patient's so, Robert Tallent.  Updated plan.  States patient baseline mentation  The patient has been appropriately medically screened and/or stabilized in the ED. I have low suspicion for any other emergent medical condition which would require further screening, evaluation or treatment in the ED or require inpatient management.  Patient is hemodynamically stable and in no acute distress.  Patient able to ambulate in department prior to ED.  Evaluation does not show acute pathology that would require ongoing or additional emergent interventions while in the emergency department or  further inpatient treatment.  I have discussed the diagnosis with the patient and answered all questions.  Pain is been managed while in the emergency department and patient has no further complaints prior to discharge.  Patient is comfortable with plan discussed in room and is stable for discharge at this time.  I have discussed strict return precautions for returning to the emergency department.  Patient was encouraged to follow-up with PCP/specialist refer to at discharge.  Patient seen and evaluated by attending Dr. Eulis Foster who agrees with above treatment, plan and disposition.      MDM Rules/Calculators/A&P                           Final Clinical Impression(s) / ED Diagnoses Final diagnoses:  Fall, initial encounter  Laceration of scalp, initial encounter    Rx / DC Orders ED Discharge Orders    None       Magnolia Mattila A, PA-C 04/12/20 1743    Janziel Hockett A, PA-C 04/12/20 1746    Daleen Bo, MD 04/12/20 2320

## 2020-04-12 NOTE — ED Provider Notes (Signed)
  Face-to-face evaluation   History: She is here for evaluation of witnessed fall, with head injury.  Apparently no reported prodrome or change in behavior afterwards.  Physical exam: Elderly alert and interactive.  She is confused.  Patient speaks with repetitive statements in response to questions.  No respiratory distress.  Medical screening examination/treatment/procedure(s) were conducted as a shared visit with non-physician practitioner(s) and myself.  I personally evaluated the patient during the encounter    Daleen Bo, MD 04/12/20 2320

## 2020-04-12 NOTE — Discharge Instructions (Addendum)
Patient has Dermabond to her scalp.  This will flake off.  There was an underlying laceration which was closed with this.  Follow up with primary care as needed

## 2020-04-12 NOTE — ED Provider Notes (Signed)
[facility] DEPT Provider Note   CSN: [id number] Arrival date & time: 04/12/20  2010     History Housing issue  Laurie Klein is a 86 y.o. female.  HPI   Patient has a history of advanced dementia.  Patient was in the emergency room earlier today after tripping and falling while walking to the bathroom.  Patient did not appear to have any specific injuries but she has very severe dementia and is not really [place] to have a meaningful conversation.  Patient had a small scalp laceration and that was repaired in the ED.  Patient had x-rays that did not show any significant injuries.  Patient's son was contacted during her visit.  Patient was discharged back to her nursing facility.  Past Medical History:  Diagnosis Date   Anxiety    Chronic headaches    history of   DDD (degenerative disc disease) 05/24/2013   Degenerative disc disease    Dyslipidemia    Headache(784.0): chronic 05/24/2013   Heart palpitations    Hypothyroidism    Mitral valve prolapse    MVP (mitral valve prolapse) 05/24/2013   PAC (premature atrial contraction)    Presence of permanent cardiac pacemaker 03/27/2018   Spinal stenosis    TMJ syndrome     Patient Active Problem List   Diagnosis Date Noted   Closed left ankle fracture 03/28/2018   Leukocytosis 03/27/2018   Symptomatic bradycardia 03/25/2018   Dementia ([place]) 03/25/2018   Contusion of right periocular region    Great toe pain, left    Syncope 04/19/2017   Syncope and collapse 05/24/2013   MVP (mitral valve prolapse) 05/24/2013   Anxiety 05/24/2013   Dyslipidemia 05/24/2013   Hypothyroidism 05/24/2013   Headache(784.0): chronic 05/24/2013   Spinal stenosis 05/24/2013   PAC (premature atrial contraction) 05/24/2013   DDD (degenerative disc disease) 05/24/2013    Past Surgical History:  Procedure Laterality Date   APPENDECTOMY     BACK SURGERY     BREAST BIOPSY  3 times    BREAST EXCISIONAL BIOPSY Right 1970   benign   BREAST EXCISIONAL BIOPSY Left 1950   2 benign   BREAST EXCISIONAL BIOPSY Right 1950   benign   CHOLECYSTECTOMY     LOOP RECORDER INSERTION N/A 01/17/2018   Procedure: LOOP RECORDER INSERTION;  Surgeon: Evans Lance, MD;  Location: [facility];  Service: Cardiovascular;  Laterality: N/A;   LOOP RECORDER REMOVAL N/A 03/27/2018   Procedure: LOOP RECORDER REMOVAL;  Surgeon: Evans Lance, MD;  Location: [facility];  Service: Cardiovascular;  Laterality: N/A;   LUMBAR MICRODISCECTOMY     PACEMAKER IMPLANT N/A 03/27/2018   Procedure: PACEMAKER IMPLANT;  Surgeon: Evans Lance, MD;  Location: [facility];  Service: Cardiovascular;  Laterality: N/A;   TONSILLECTOMY       OB History   No obstetric history on file.     Family History  Problem Relation Age of Onset   Heart failure Mother 26   Stroke Mother    Heart disease Father    Melanoma Child     Social History   Tobacco Use   Smoking status: Former Smoker    Packs/day: 0.50    Types: Cigarettes    Quit date: 10/03/1973    Years since quitting: 46.5   Smokeless tobacco: Never Used  Vaping Use   Vaping Use: Never used  Substance Use Topics   Alcohol use: Yes  Alcohol/week: 3.0 standard drinks    Types: 3 Standard drinks or equivalent per week    Comment: 4 glasses wine per week   Drug use: No    Home Medications Prior to Admission medications   Medication Sig Start Date End Date Taking? Authorizing Provider  acetaminophen (TYLENOL) 325 MG tablet Take 2 tablets (650 mg total) by mouth every 4 (four) hours as needed for mild pain or fever. 03/29/18   Roxan Hockey, MD  Calcium Citrate (CITRACAL PO) Take 1 tablet by mouth daily.     [provider]  cholecalciferol (VITAMIN D) 1000 UNITS tablet Take 1,000 Units by mouth daily.     [provider]  co-enzyme Q-10 30 MG capsule Take 100 mg by mouth daily.      [provider]  donepezil (ARICEPT) 10 MG tablet Take 10 mg by mouth at bedtime.    [provider]  Multiple Vitamin (MULTIVITAMIN) capsule Take 1 capsule by mouth daily.    [provider]  neomycin-polymyxin-hydrocortisone (CORTISPORIN) OTIC solution Apply 1-2 drops to right great toe covered with bandaid every other day 11/28/18   Landis Martins, DPM  polyethylene glycol (MIRALAX / GLYCOLAX) packet Take 17 g by mouth daily as needed for mild constipation. 03/29/18   Roxan Hockey, MD  simvastatin (ZOCOR) 20 MG tablet Take 20 mg by mouth every evening.    [provider]  SYNTHROID 75 MCG tablet Take 75 mcg by mouth daily. 02/04/17   [provider]  tetrahydrozoline (VISINE) 0.05 % ophthalmic solution Place 3 drops into both eyes daily as needed (For dry eyes).     [provider]    Allergies    Acrylic polymer, Lipitor [atorvastatin calcium], Orudis kt, Sulfa antibiotics, and Amoxicillin  Review of Systems   Review of Systems  Unable to perform ROS: Dementia    Physical Exam Updated Vital Signs BP 136/71    Pulse 73    Temp 98.7 F (37.1 C) (Oral)    Resp 16    SpO2 99%   Physical Exam Vitals and nursing note reviewed.  Constitutional:      General: She is not in acute distress.    Appearance: She is well-developed.  HENT:     Head: Normocephalic.     Right Ear: External ear normal.     Left Ear: External ear normal.  Eyes:     General: No scleral icterus.       Right eye: No discharge.        Left eye: No discharge.     Conjunctiva/sclera: Conjunctivae normal.  Neck:     Trachea: No tracheal deviation.  Cardiovascular:     Rate and Rhythm: Normal rate and regular rhythm.  Pulmonary:     Effort: Pulmonary effort is normal. No respiratory distress.     Breath sounds: Normal breath sounds. No stridor. No wheezing or rales.  Abdominal:     General: Bowel sounds are normal. There is no distension.     Palpations:  Abdomen is soft.     Tenderness: There is no abdominal tenderness. There is no guarding or rebound.  Musculoskeletal:        General: No tenderness.     Cervical back: Neck supple.  Skin:    General: Skin is warm and dry.     Findings: No rash.  Neurological:     Mental Status: She is alert. Mental status is at baseline.     Cranial Nerves: No cranial  nerve deficit (no facial droop, extraocular movements intact, no slurred speech ).     Sensory: No sensory deficit.     Motor: No abnormal muscle tone or seizure activity.     Coordination: Coordination normal.     Comments: Confused verbal response, nonsensical, moves all extremities     ED Results / Procedures / Treatments   Labs (all labs ordered are listed, but only abnormal results are displayed) Labs Reviewed - No data to display  EKG None  Radiology DG Chest 2 View  Result Date: 04/12/2020 CLINICAL DATA:  Fall from standing position. Posterior head laceration. EXAM: CHEST - 2 VIEW COMPARISON:  Radiographs 03/28/2018.  Chest CT 09/20/2013. FINDINGS: Left subclavian pacemaker leads extend into the right atrium and right ventricle. The heart size and mediastinal contours are stable. There is mild vascular congestion, but no overt pulmonary edema, confluent airspace opacity, pleural effusion or pneumothorax. No acute fractures are seen. IMPRESSION: No acute cardiopulmonary process or posttraumatic findings. Mild vascular congestion. Electronically Signed   By: Richardean Sale M.D.   On: 04/12/2020 17:16   DG Pelvis 1-2 Views  Result Date: 04/12/2020 CLINICAL DATA:  Fall from standing position. EXAM: PELVIS - 1-2 VIEW COMPARISON:  Pelvic CT 09/24/2017. FINDINGS: The bones appear adequately mineralized. No evidence of acute fracture or dislocation. There are mild degenerative changes of the hips, sacroiliac joints and lower lumbar spine. No evidence of femoral head avascular necrosis. Right pelvic calcification is unchanged from  previous CT, consistent with a calcified exophytic fibroid or calcified adnexal lesion. The soft tissues appear unremarkable. IMPRESSION: No acute osseous findings. Mild degenerative changes. Electronically Signed   By: Richardean Sale M.D.   On: 04/12/2020 17:14   CT Head Wo Contrast  Result Date: 04/12/2020 CLINICAL DATA:  Fall in bathroom, nonverbal at baseline EXAM: CT HEAD WITHOUT CONTRAST CT CERVICAL SPINE WITHOUT CONTRAST TECHNIQUE: Multidetector CT imaging of the head and cervical spine was performed following the standard protocol without intravenous contrast. Multiplanar CT image reconstructions of the cervical spine were also generated. COMPARISON:  CT head and cervical spine 03/20/2018 FINDINGS: CT HEAD FINDINGS Brain: No evidence of acute infarction, hemorrhage, hydrocephalus, extra-axial collection, visible mass lesion or mass effect. Patchy areas of white matter hypoattenuation are most compatible with chronic microvascular angiopathy. Diffuse prominence of the ventricles and sulci, similar to prior. Some slight asymmetric ex vacuo dilatation of the ventricles is unchanged from comparison. Vascular: Atherosclerotic calcification of the carotid siphons and intradural vertebral arteries. No hyperdense vessel. Skull: Some minimal right parietal scalp thickening without large crescentic hematoma or subjacent calvarial fracture. No acute osseous abnormality or suspicious osseous lesions. Sinuses/Orbits: Prior bilateral lens extractions with senescent scleral plaques. Included orbital contents are otherwise unremarkable. Paranasal sinuses and mastoid air cells are predominantly clear. Debris seen in the bilateral external auditory canals, right greater than left. Other: Mild bilateral TMJ arthrosis. CT CERVICAL SPINE FINDINGS Alignment: Stabilization collar absent at the time of examination. Preservation of the normal cervical lordosis. Unchanged retrolisthesis C3 on 4, C5 on 6 and anterolisthesis C4 on  5. Findings favored to be on a spondylitic basis given disc desiccations and spondylitic endplate changes at these levels. No abnormally widened, jumped or perched facets. Craniocervical and atlantoaxial articulations are maintained accounting for mild rightward cranial rotation. Skull base and vertebrae: No acute skull base fracture. No vertebral body fracture or height loss. Normal bone mineralization. No worrisome osseous lesions. Soft tissues and spinal canal: No pre or paravertebral fluid or swelling. No  visible canal hematoma. Disc levels: Multilevel disc osteophyte complexes, largest C3-4 result in partial effacement of ventral thecal sac without significant spinal canal impingement. Multilevel uncinate spurring and facet hypertrophic changes are present as well resulting in mild-to-moderate multilevel neural foraminal narrowing maximal on the left C5-6 and bilaterally C3-4. Upper chest: Biapical pleuroparenchymal scarring. Some interlobular septal thickening may be present suggestive of early interstitial edema. No other acute abnormality in the lung apices. Other: Diminutive appearance of the thyroid without concerning nodule or mass. IMPRESSION: 1. No acute intracranial abnormality. 2. Stable atrophy and chronic microvascular ischemic white matter disease. 3. Minimal right parietal scalp thickening without large crescentic hematoma or subjacent calvarial fracture. 4. No acute cervical spine fracture or traumatic listhesis. 5. Multilevel degenerative changes of the cervical spine as described above. 6. Debris in the bilateral external auditory canals, right greater than left, correlate with visual inspection. 7. Some interlobular septal thickening may be present in the lung apices suggestive of early interstitial edema. Electronically Signed   By: Lovena Le M.D.   On: 04/12/2020 17:09   CT Cervical Spine Wo Contrast  Result Date: 04/12/2020 CLINICAL DATA:  Fall in bathroom, nonverbal at baseline  EXAM: CT HEAD WITHOUT CONTRAST CT CERVICAL SPINE WITHOUT CONTRAST TECHNIQUE: Multidetector CT imaging of the head and cervical spine was performed following the standard protocol without intravenous contrast. Multiplanar CT image reconstructions of the cervical spine were also generated. COMPARISON:  CT head and cervical spine 03/20/2018 FINDINGS: CT HEAD FINDINGS Brain: No evidence of acute infarction, hemorrhage, hydrocephalus, extra-axial collection, visible mass lesion or mass effect. Patchy areas of white matter hypoattenuation are most compatible with chronic microvascular angiopathy. Diffuse prominence of the ventricles and sulci, similar to prior. Some slight asymmetric ex vacuo dilatation of the ventricles is unchanged from comparison. Vascular: Atherosclerotic calcification of the carotid siphons and intradural vertebral arteries. No hyperdense vessel. Skull: Some minimal right parietal scalp thickening without large crescentic hematoma or subjacent calvarial fracture. No acute osseous abnormality or suspicious osseous lesions. Sinuses/Orbits: Prior bilateral lens extractions with senescent scleral plaques. Included orbital contents are otherwise unremarkable. Paranasal sinuses and mastoid air cells are predominantly clear. Debris seen in the bilateral external auditory canals, right greater than left. Other: Mild bilateral TMJ arthrosis. CT CERVICAL SPINE FINDINGS Alignment: Stabilization collar absent at the time of examination. Preservation of the normal cervical lordosis. Unchanged retrolisthesis C3 on 4, C5 on 6 and anterolisthesis C4 on 5. Findings favored to be on a spondylitic basis given disc desiccations and spondylitic endplate changes at these levels. No abnormally widened, jumped or perched facets. Craniocervical and atlantoaxial articulations are maintained accounting for mild rightward cranial rotation. Skull base and vertebrae: No acute skull base fracture. No vertebral body fracture or  height loss. Normal bone mineralization. No worrisome osseous lesions. Soft tissues and spinal canal: No pre or paravertebral fluid or swelling. No visible canal hematoma. Disc levels: Multilevel disc osteophyte complexes, largest C3-4 result in partial effacement of ventral thecal sac without significant spinal canal impingement. Multilevel uncinate spurring and facet hypertrophic changes are present as well resulting in mild-to-moderate multilevel neural foraminal narrowing maximal on the left C5-6 and bilaterally C3-4. Upper chest: Biapical pleuroparenchymal scarring. Some interlobular septal thickening may be present suggestive of early interstitial edema. No other acute abnormality in the lung apices. Other: Diminutive appearance of the thyroid without concerning nodule or mass. IMPRESSION: 1. No acute intracranial abnormality. 2. Stable atrophy and chronic microvascular ischemic white matter disease. 3. Minimal right parietal scalp thickening  without large crescentic hematoma or subjacent calvarial fracture. 4. No acute cervical spine fracture or traumatic listhesis. 5. Multilevel degenerative changes of the cervical spine as described above. 6. Debris in the bilateral external auditory canals, right greater than left, correlate with visual inspection. 7. Some interlobular septal thickening may be present in the lung apices suggestive of early interstitial edema. Electronically Signed   By: Lovena Le M.D.   On: 04/12/2020 17:09    Procedures Procedures (including critical care time)  Medications Ordered in ED Medications  donepezil (ARICEPT) tablet 10 mg (10 mg Oral Given 04/12/20 2147)  simvastatin (ZOCOR) tablet 20 mg (20 mg Oral Given 04/12/20 2146)  levothyroxine (SYNTHROID) tablet 75 mcg (0 mcg Oral Hold 04/12/20 2126)    ED Course  I have reviewed the triage vital signs and the nursing notes.  Pertinent labs & imaging results that were available during my care of the patient were reviewed  by me and considered in my medical decision making (see chart for details).    MDM Rules/Calculators/A&P                          Patient was seen in the emergency room earlier today after a fall.  Patient's ED work-up did not show any signs of serious injury.  Patient does have advanced dementia and appears to be at her baseline.  Patient is back in the ED because her nursing facility cannot locate her one-to-one caregiver.  Patient does not have any acute medical issues but we will monitor here in the ED until she can be safely transferred back to her living facility.  Care turned over to oncoming team Final Clinical Impression(s) / ED Diagnoses Final diagnoses:  Dementia without behavioral disturbance, unspecified dementia type [facility])    Rx / DC Orders ED Discharge Orders    None       Dorie Rank, MD 04/12/20 2233

## 2020-04-12 NOTE — ED Triage Notes (Signed)
EMS reports from [facility] independent living, witnessed slip and fall from standing in bathroom struck back of head.. No LOC, no blood thinners, small Lac to back of head, no other obvious injuries. Pt non verbal baseline.  BP 133/60 HR 65 RR 18 Sp02 96 RA CBG 175 Temp 97.5

## 2020-04-12 NOTE — ED Triage Notes (Signed)
Pt sent back from [facility] where she lives. They state that she is not appropriate for independent living. States that she is supposed to have a 24/7 nurse that no one can find. Hx of dementia.

## 2020-04-12 NOTE — ED Notes (Signed)
Attempted to call report but was sent to voicemail.

## 2020-04-13 NOTE — Progress Notes (Signed)
CSW attempted to contact facility. Will continue to attempt to contact.

## 2020-04-13 NOTE — Progress Notes (Signed)
CSW spoke with Angela Nevin, Metallurgist at [facility] [phone] and caregiver is in place at independent living apartment. CSW called son @ [phone] in an effort to assess further home health/dme needs, awaiting callback.

## 2020-04-13 NOTE — ED Notes (Signed)
Pt returned to [facility] with son

## 2020-04-26 ENCOUNTER — Emergency Department (HOSPITAL_COMMUNITY): Payer: Medicare Other

## 2020-04-26 ENCOUNTER — Emergency Department (HOSPITAL_COMMUNITY)
Admission: EM | Admit: 2020-04-26 | Discharge: 2020-04-26 | Disposition: A | Discharge status: Home / Self Care | Payer: Medicare Other | Attending: Emergency Medicine | Admitting: Emergency Medicine

## 2020-04-26 ENCOUNTER — Encounter (HOSPITAL_COMMUNITY): Payer: Self-pay | Admitting: Emergency Medicine

## 2020-04-26 ENCOUNTER — Other Ambulatory Visit: Payer: Self-pay

## 2020-04-26 ENCOUNTER — Emergency Department (HOSPITAL_COMMUNITY)
Admission: EM | Admit: 2020-04-26 | Discharge: 2020-04-27 | Disposition: A | Discharge status: Home / Self Care | Payer: Medicare Other | Source: Home / Self Care

## 2020-04-26 DIAGNOSIS — E039 Hypothyroidism, unspecified: Secondary | ICD-10-CM | POA: Insufficient documentation

## 2020-04-26 DIAGNOSIS — M542 Cervicalgia: Secondary | ICD-10-CM | POA: Diagnosis not present

## 2020-04-26 DIAGNOSIS — I959 Hypotension, unspecified: Secondary | ICD-10-CM | POA: Insufficient documentation

## 2020-04-26 DIAGNOSIS — R230 Cyanosis: Secondary | ICD-10-CM | POA: Insufficient documentation

## 2020-04-26 DIAGNOSIS — W19XXXA Unspecified fall, initial encounter: Secondary | ICD-10-CM

## 2020-04-26 DIAGNOSIS — R0602 Shortness of breath: Secondary | ICD-10-CM | POA: Diagnosis not present

## 2020-04-26 DIAGNOSIS — R55 Syncope and collapse: Secondary | ICD-10-CM | POA: Diagnosis not present

## 2020-04-26 DIAGNOSIS — F0390 Unspecified dementia without behavioral disturbance: Secondary | ICD-10-CM | POA: Diagnosis not present

## 2020-04-26 DIAGNOSIS — R404 Transient alteration of awareness: Secondary | ICD-10-CM | POA: Diagnosis not present

## 2020-04-26 DIAGNOSIS — Z79899 Other long term (current) drug therapy: Secondary | ICD-10-CM | POA: Diagnosis not present

## 2020-04-26 DIAGNOSIS — Z87891 Personal history of nicotine dependence: Secondary | ICD-10-CM | POA: Diagnosis not present

## 2020-04-26 DIAGNOSIS — R519 Headache, unspecified: Secondary | ICD-10-CM | POA: Diagnosis not present

## 2020-04-26 DIAGNOSIS — Z20822 Contact with and (suspected) exposure to covid-19: Secondary | ICD-10-CM | POA: Insufficient documentation

## 2020-04-26 DIAGNOSIS — Z950 Presence of cardiac pacemaker: Secondary | ICD-10-CM | POA: Diagnosis not present

## 2020-04-26 DIAGNOSIS — D329 Benign neoplasm of meninges, unspecified: Secondary | ICD-10-CM | POA: Diagnosis not present

## 2020-04-26 DIAGNOSIS — R0902 Hypoxemia: Secondary | ICD-10-CM | POA: Diagnosis not present

## 2020-04-26 DIAGNOSIS — D320 Benign neoplasm of cerebral meninges: Secondary | ICD-10-CM

## 2020-04-26 LAB — URINALYSIS, ROUTINE W REFLEX MICROSCOPIC
Bilirubin Urine: NEGATIVE
Glucose, UA: NEGATIVE mg/dL
Hgb urine dipstick: NEGATIVE
Ketones, ur: 5 mg/dL — AB
Leukocytes,Ua: NEGATIVE
Nitrite: NEGATIVE
Protein, ur: NEGATIVE mg/dL
Specific Gravity, Urine: 1.01 (ref 1.005–1.030)
pH: 7 (ref 5.0–8.0)

## 2020-04-26 LAB — RESPIRATORY PANEL BY RT PCR (FLU A&B, COVID)
Influenza A by PCR: NEGATIVE
Influenza B by PCR: NEGATIVE
SARS Coronavirus 2 by RT PCR: NEGATIVE

## 2020-04-26 LAB — COMPREHENSIVE METABOLIC PANEL
ALT: 18 U/L (ref 0–44)
AST: 22 U/L (ref 15–41)
Albumin: 3.6 g/dL (ref 3.5–5.0)
Alkaline Phosphatase: 57 U/L (ref 38–126)
Anion gap: 12 (ref 5–15)
BUN: 21 mg/dL (ref 8–23)
CO2: 23 mmol/L (ref 22–32)
Calcium: 9.2 mg/dL (ref 8.9–10.3)
Chloride: 105 mmol/L (ref 98–111)
Creatinine, Ser: 0.76 mg/dL (ref 0.44–1.00)
GFR, Estimated: >60 mL/min (ref >60)
Glucose, Bld: 98 mg/dL (ref 70–99)
Potassium: 4.2 mmol/L (ref 3.5–5.1)
Sodium: 140 mmol/L (ref 135–145)
Total Bilirubin: 0.9 mg/dL (ref 0.3–1.2)
Total Protein: 6.8 g/dL (ref 6.5–8.1)

## 2020-04-26 LAB — CBC WITH DIFFERENTIAL/PLATELET
Abs Immature Granulocytes: 0.06 10*3/uL (ref 0.00–0.07)
Basophils Absolute: 0.1 10*3/uL (ref 0.0–0.1)
Basophils Relative: 1 %
Eosinophils Absolute: 0 10*3/uL (ref 0.0–0.5)
Eosinophils Relative: 0 %
HCT: 43.3 % (ref 36.0–46.0)
Hemoglobin: 14.1 g/dL (ref 12.0–15.0)
Immature Granulocytes: 1 %
Lymphocytes Relative: 22 %
Lymphs Abs: 2.9 10*3/uL (ref 0.7–4.0)
MCH: 30.7 pg (ref 26.0–34.0)
MCHC: 32.6 g/dL (ref 30.0–36.0)
MCV: 94.1 fL (ref 80.0–100.0)
Monocytes Absolute: 0.7 10*3/uL (ref 0.1–1.0)
Monocytes Relative: 5 %
Neutro Abs: 9.4 10*3/uL — ABNORMAL HIGH (ref 1.7–7.7)
Neutrophils Relative %: 71 %
Platelets: 332 10*3/uL (ref 150–400)
RBC: 4.6 MIL/uL (ref 3.87–5.11)
RDW: 14.6 % (ref 11.5–15.5)
WBC: 13.2 10*3/uL — ABNORMAL HIGH (ref 4.0–10.5)
nRBC: 0 % (ref 0.0–0.2)

## 2020-04-26 NOTE — ED Triage Notes (Signed)
Pt brought to ED by [facility] from [facility]. Initially call for a fall, on EMS arrival pt became SOB, cyanotic and low BP. c collar placed by ems pta. BP 64/30, HR 23, HR 70 SPO2 70%RA placed on 4L up to 95%. 750 mL NS bolus given by EMS PTA, Hx of dementia pt on her baseline on arrival.

## 2020-04-26 NOTE — ED Notes (Signed)
Attempted multiple times to call report to [facility] with no answer. Left call back number.

## 2020-04-26 NOTE — Discharge Instructions (Signed)
Continue taking home medications as prescribed. Make sure she is staying well-hydrated with water. She will need to follow-up with her primary care doctor for further evaluation management of the new finding of a brain mass on head CT. Return to the emergency room with any new, worsening, concerning symptoms.

## 2020-04-26 NOTE — ED Notes (Signed)
[facility] CALLED BY Rosine Door

## 2020-04-26 NOTE — ED Triage Notes (Signed)
Pt brought back to facility by [facility] due to inability to drop off at [facility] after previous DC- no family/person able to care for pt. Family to come pick pt up in AM for return to facility.

## 2020-04-26 NOTE — ED Provider Notes (Signed)
Care received from [place] PA-C at shift change.  Please see her note for full HPI.  In short, 86 year old female presented after an unwitnessed fall.  She did not have any noted injuries, and EMS had plans to leave, however when the patient stood up she became hypotensive, hypoxic with some cyanosis.  Work-up in the ED was overall reassuring-CT of the head and neck without acute findings, however a 5 mm meningioma was noted.  Previous provider spoke with the patient's family to have this followed up on with her PCP.  Labs were overall reassuring, orthostatics negative for findings.  Plan was to follow-up on a UA and discharge if this is normal.  I personally reviewed the patient's UA which did not show evidence of UTI. Patient stable for discharge with PCP follow-up.  Discussed this with the patient's son who voices understanding is agreeable.  At the stage in the ED course, the patient sporadically screened and stable for discharge. Physical Exam  BP 133/64   Pulse 67   Temp (!) 97.5 F (36.4 C) (Oral)   Resp 15   Ht 5' (1.524 m)   Wt 57.2 kg   SpO2 95%   BMI 24.63 kg/m   Physical Exam Vitals and nursing note reviewed.  Constitutional:      General: She is not in acute distress.    Appearance: She is well-developed.  HENT:     Head: Normocephalic and atraumatic.     Comments: Old injury noted to the right occiput Eyes:     Conjunctiva/sclera: Conjunctivae normal.  Cardiovascular:     Rate and Rhythm: Normal rate and regular rhythm.     Heart sounds: No murmur heard.   Pulmonary:     Effort: Pulmonary effort is normal. No respiratory distress.     Breath sounds: Normal breath sounds. No decreased breath sounds.  Chest:     Chest wall: No tenderness.  Abdominal:     Palpations: Abdomen is soft.     Tenderness: There is no abdominal tenderness.  Musculoskeletal:     Cervical back: Neck supple.     Right lower leg: No tenderness. No edema.     Left lower leg: No tenderness.  No edema.  Skin:    General: Skin is warm and dry.  Neurological:     General: No focal deficit present.     Mental Status: She is alert.  Psychiatric:        Mood and Affect: Mood normal.     ED Course/Procedures     Procedures  MDM         Garald Balding, PA-C 04/26/20 1731    Hayden Rasmussen, MD 04/27/20 1120

## 2020-04-26 NOTE — ED Notes (Signed)
One set of blood cultures obtained and sent to lab

## 2020-04-26 NOTE — ED Provider Notes (Signed)
[place] EMERGENCY DEPARTMENT Provider Note   CSN: [id number] Arrival date & time: 04/26/20  1400     History Chief Complaint  Patient presents with  . Fall  . Shortness of Breath    Laurie Klein is a 86 y.o. female presented for evaluation after fall.  Level 5 caveat due to dementia.  Per EMS, patient at an assisted living facility, had an unwitnessed fall.  Was not found to have any injuries, and plan was for EMS to leave, however patient stood up and became hypotensive, hypoxic with noted cyanosis.  Patient was given fluids with EMS, this improved her symptoms. No reported fevers, chills, cough, shortness of breath.  Additional history obtained from chart review.  Patient with a history of anxiety, degenerative disc disease, hypothyroidism, mitral valve prolapse, PACs, symptomatic anemia, dementia.  Patient is not on blood thinners.  HPI     Past Medical History:  Diagnosis Date  . Anxiety   . Chronic headaches    history of  . DDD (degenerative disc disease) 05/24/2013  . Degenerative disc disease   . Dyslipidemia   . Headache(784.0): chronic 05/24/2013  . Heart palpitations   . Hypothyroidism   . Mitral valve prolapse   . MVP (mitral valve prolapse) 05/24/2013  . PAC (premature atrial contraction)   . Presence of permanent cardiac pacemaker 03/27/2018  . Spinal stenosis   . TMJ syndrome     Patient Active Problem List   Diagnosis Date Noted  . Closed left ankle fracture 03/28/2018  . Leukocytosis 03/27/2018  . Symptomatic bradycardia 03/25/2018  . Dementia ([place]) 03/25/2018  . Contusion of right periocular region   . Great toe pain, left   . Syncope 04/19/2017  . Syncope and collapse 05/24/2013  . MVP (mitral valve prolapse) 05/24/2013  . Anxiety 05/24/2013  . Dyslipidemia 05/24/2013  . Hypothyroidism 05/24/2013  . Headache(784.0): chronic 05/24/2013  . Spinal stenosis 05/24/2013  . PAC (premature atrial contraction) 05/24/2013    . DDD (degenerative disc disease) 05/24/2013    Past Surgical History:  Procedure Laterality Date  . APPENDECTOMY    . BACK SURGERY    . BREAST BIOPSY  3 times  . BREAST EXCISIONAL BIOPSY Right 1970   benign  . BREAST EXCISIONAL BIOPSY Left 1950   2 benign  . BREAST EXCISIONAL BIOPSY Right 1950   benign  . CHOLECYSTECTOMY    . LOOP RECORDER INSERTION N/A 01/17/2018   Procedure: LOOP RECORDER INSERTION;  Surgeon: Evans Lance, MD;  Location: [facility];  Service: Cardiovascular;  Laterality: N/A;  . LOOP RECORDER REMOVAL N/A 03/27/2018   Procedure: LOOP RECORDER REMOVAL;  Surgeon: Evans Lance, MD;  Location: [facility];  Service: Cardiovascular;  Laterality: N/A;  . LUMBAR MICRODISCECTOMY    . PACEMAKER IMPLANT N/A 03/27/2018   Procedure: PACEMAKER IMPLANT;  Surgeon: Evans Lance, MD;  Location: [facility];  Service: Cardiovascular;  Laterality: N/A;  . TONSILLECTOMY       OB History   No obstetric history on file.     Family History  Problem Relation Age of Onset  . Heart failure Mother 87  . Stroke Mother   . Heart disease Father   . Melanoma Child     Social History   Tobacco Use  . Smoking status: Former Smoker    Packs/day: 0.50    Types: Cigarettes    Quit date: 10/03/1973    Years since quitting: 46.5  .  Smokeless tobacco: Never Used  Vaping Use  . Vaping Use: Never used  Substance Use Topics  . Alcohol use: Yes    Alcohol/week: 3.0 standard drinks    Types: 3 Standard drinks or equivalent per week    Comment: 4 glasses wine per week  . Drug use: No    Home Medications Prior to Admission medications   Medication Sig Start Date End Date Taking? Authorizing Provider  acetaminophen (TYLENOL) 325 MG tablet Take 2 tablets (650 mg total) by mouth every 4 (four) hours as needed for mild pain or fever. 03/29/18   Roxan Hockey, MD  Calcium Citrate (CITRACAL PO) Take 1 tablet by mouth daily.     [provider]   cholecalciferol (VITAMIN D) 1000 UNITS tablet Take 1,000 Units by mouth daily.     [provider]  co-enzyme Q-10 30 MG capsule Take 100 mg by mouth daily.     [provider]  donepezil (ARICEPT) 10 MG tablet Take 10 mg by mouth at bedtime.    [provider]  donepezil (ARICEPT) 5 MG tablet Take 5 mg by mouth daily. 04/01/20   [provider]  levothyroxine (SYNTHROID) 75 MCG tablet Take 75 mcg by mouth daily before breakfast.    [provider]  Multiple Vitamin (MULTIVITAMIN) capsule Take 1 capsule by mouth daily.    [provider]  neomycin-polymyxin-hydrocortisone (CORTISPORIN) OTIC solution Apply 1-2 drops to right great toe covered with bandaid every other day 11/28/18   Landis Martins, DPM  polyethylene glycol (MIRALAX / GLYCOLAX) packet Take 17 g by mouth daily as needed for mild constipation. 03/29/18   Roxan Hockey, MD  simvastatin (ZOCOR) 20 MG tablet Take 20 mg by mouth every evening.    [provider]  SYNTHROID 75 MCG tablet Take 75 mcg by mouth daily. 02/04/17   [provider]  tetrahydrozoline (VISINE) 0.05 % ophthalmic solution Place 3 drops into both eyes daily as needed (For dry eyes).     [provider]    Allergies    Acrylic polymer, Chlorzoxazone, Lipitor [atorvastatin calcium], Orudis kt, Sulfa antibiotics, and Amoxicillin  Review of Systems   Review of Systems  Unable to perform ROS: Dementia  Skin: Positive for wound (scalp lac from fall 2 weeks ago).    Physical Exam Updated Vital Signs BP 133/64   Pulse 67   Temp (!) 97.5 F (36.4 C) (Oral)   Resp 15   Ht 5' (1.524 m)   Wt 57.2 kg   SpO2 95%   BMI 24.63 kg/m   Physical Exam Vitals and nursing note reviewed.  Constitutional:      General: She is not in acute distress.    Appearance: She is well-developed and underweight.     Comments: Elderly female with signs of dementia, no acute distress  HENT:     Head:  Normocephalic and atraumatic.      Comments: No new signs of head trauma.  Old injury noted on the occiput. Eyes:     Conjunctiva/sclera: Conjunctivae normal.     Pupils: Pupils are equal, round, and reactive to light.  Neck:     Comments: In c-collar Cardiovascular:     Rate and Rhythm: Normal rate and regular rhythm.     Pulses: Normal pulses.  Pulmonary:     Effort: Pulmonary effort is normal. No respiratory distress.     Breath sounds: Normal breath sounds. No wheezing.     Comments: Clear lung sounds Abdominal:  General: There is no distension.     Palpations: Abdomen is soft. There is no mass.     Tenderness: There is no abdominal tenderness. There is no rebound.  Musculoskeletal:        General: Normal range of motion.  Skin:    General: Skin is warm and dry.     Capillary Refill: Capillary refill takes less than 2 seconds.     ED Results / Procedures / Treatments   Labs (all labs ordered are listed, but only abnormal results are displayed) Labs Reviewed  CBC WITH DIFFERENTIAL/PLATELET - Abnormal; Notable for the following components:      Result Value   WBC 13.2 (*)    Neutro Abs 9.4 (*)    All other components within normal limits  RESPIRATORY PANEL BY RT PCR (FLU A&B, COVID)  COMPREHENSIVE METABOLIC PANEL  URINALYSIS, ROUTINE W REFLEX MICROSCOPIC    EKG EKG Interpretation  Date/Time:  Saturday April 26 2020 14:59:09 EDT Ventricular Rate:  61 PR Interval:    QRS Duration: 88 QT Interval:  451 QTC Calculation: 455 R Axis:   26 Text Interpretation: Sinus rhythm sinus replacing paced rhythm on prior 9/19 Confirmed by Aletta Edouard [phone]) on 04/26/2020 4:18:59 PM   Radiology DG Chest 2 View  Result Date: 04/26/2020 CLINICAL DATA:  Shortness of breath and hypotension.  Fall. EXAM: CHEST - 2 VIEW COMPARISON:  04/12/2020 chest radiograph and prior studies FINDINGS: The cardiomediastinal silhouette is unremarkable. A LEFT-sided pacemaker again noted.  There is no evidence of focal airspace disease, pulmonary edema, suspicious pulmonary nodule/mass, pleural effusion, or pneumothorax. No acute bony abnormalities are identified. IMPRESSION: No active cardiopulmonary disease. Electronically Signed   By: Margarette Canada M.D.   On: 04/26/2020 15:05   CT Head Wo Contrast  Result Date: 04/26/2020 CLINICAL DATA:  86 year old female with headache and neck pain following fall. EXAM: CT HEAD WITHOUT CONTRAST CT CERVICAL SPINE WITHOUT CONTRAST TECHNIQUE: Multidetector CT imaging of the head and cervical spine was performed following the standard protocol without intravenous contrast. Multiplanar CT image reconstructions of the cervical spine were also generated. COMPARISON:  04/12/2020 CTs FINDINGS: CT HEAD FINDINGS Brain: A 5 mm anterior LEFT parafalcine extra-axial mass is now better visualized and compatible with a small meningioma. No evidence of acute infarction, hemorrhage, hydrocephalus, extra-axial collection or mass effect. Atrophy and chronic small-vessel white matter ischemic changes are again noted. Vascular: Carotid atherosclerotic calcifications are noted. Skull: Normal. Negative for fracture or focal lesion. Sinuses/Orbits: No acute finding. Other: None. CT CERVICAL SPINE FINDINGS Alignment: 2.5 mm retrolisthesis of C3 on C4 is unchanged. No acute subluxation is identified. Skull base and vertebrae: No acute fracture. No primary bone lesion or focal pathologic process. Soft tissues and spinal canal: No prevertebral fluid or swelling. No visible canal hematoma. Disc levels: Mild to moderate multilevel degenerative disc disease/spondylosis identified. Upper chest: No acute abnormality Other: None IMPRESSION: 1. No evidence of acute intracranial abnormality. 2. Cerebral atrophy, chronic small-vessel white matter ischemic changes and 5 mm anterior LEFT parafalcine meningioma without mass effect. 3. No static evidence of acute injury to the cervical spine. Mild to  moderate multilevel degenerative disc disease/spondylosis. Electronically Signed   By: Margarette Canada M.D.   On: 04/26/2020 15:14   CT Cervical Spine Wo Contrast  Result Date: 04/26/2020 CLINICAL DATA:  86 year old female with headache and neck pain following fall. EXAM: CT HEAD WITHOUT CONTRAST CT CERVICAL SPINE WITHOUT CONTRAST TECHNIQUE: Multidetector CT imaging of the head and cervical spine  was performed following the standard protocol without intravenous contrast. Multiplanar CT image reconstructions of the cervical spine were also generated. COMPARISON:  04/12/2020 CTs FINDINGS: CT HEAD FINDINGS Brain: A 5 mm anterior LEFT parafalcine extra-axial mass is now better visualized and compatible with a small meningioma. No evidence of acute infarction, hemorrhage, hydrocephalus, extra-axial collection or mass effect. Atrophy and chronic small-vessel white matter ischemic changes are again noted. Vascular: Carotid atherosclerotic calcifications are noted. Skull: Normal. Negative for fracture or focal lesion. Sinuses/Orbits: No acute finding. Other: None. CT CERVICAL SPINE FINDINGS Alignment: 2.5 mm retrolisthesis of C3 on C4 is unchanged. No acute subluxation is identified. Skull base and vertebrae: No acute fracture. No primary bone lesion or focal pathologic process. Soft tissues and spinal canal: No prevertebral fluid or swelling. No visible canal hematoma. Disc levels: Mild to moderate multilevel degenerative disc disease/spondylosis identified. Upper chest: No acute abnormality Other: None IMPRESSION: 1. No evidence of acute intracranial abnormality. 2. Cerebral atrophy, chronic small-vessel white matter ischemic changes and 5 mm anterior LEFT parafalcine meningioma without mass effect. 3. No static evidence of acute injury to the cervical spine. Mild to moderate multilevel degenerative disc disease/spondylosis. Electronically Signed   By: Margarette Canada M.D.   On: 04/26/2020 15:14    Procedures Procedures  (including critical care time)  Medications Ordered in ED Medications - No data to display  ED Course  I have reviewed the triage vital signs and the nursing notes.  Pertinent labs & imaging results that were available during my care of the patient were reviewed by me and considered in my medical decision making (see chart for details).    MDM Rules/Calculators/A&P                          Patient presenting for evaluation after witnessed fall.  With EMS, patient stood up and became hypotensive and hypoxic.  On exam at the ED, patient appears nontoxic.  She is at her baseline mental status.  She has no new signs of injury.  Pulmonary exam is overall reassuring.  She has received fluids with EMS, consider vagal syndrome that may have improved her symptoms.  Will obtain labs, chest x-ray, CT head and neck.  Will repeat orthostatics and reassess. Case discussed with attending, Dr. Roslynn Amble evaluated the pt.   CT head and neck negative for acute findings.  However patient was found to have a 5 mm meningioma.  We will have her follow-up with PCP regarding these findings.  Chest x-ray viewed interpreted by me, no pneumonia.  Also effusion, cardiomegaly.  Labs interpreted by me, overall reassuring.  Urine pending.  Orthostatics negative for acute findings or changes.  No hypoxia or hypotension.  Discussed with patient son.  Discussed plan for discharge if urine is normal.  Discussed findings of meningioma and follow-up with PCP.    Patient signed out to Jamey Ripa, PA-C.  Likely discharge, pending urine.  Final Clinical Impression(s) / ED Diagnoses Final diagnoses:  None    Rx / DC Orders ED Discharge Orders    None       Franchot Heidelberg, PA-C 04/26/20 1629    Lucrezia Starch, MD 04/27/20 3009    Lucrezia Starch, MD 04/27/20 [phone]

## 2020-04-27 DIAGNOSIS — R5381 Other malaise: Secondary | ICD-10-CM | POA: Diagnosis not present

## 2020-04-27 DIAGNOSIS — Z7401 Bed confinement status: Secondary | ICD-10-CM | POA: Diagnosis not present

## 2020-04-27 DIAGNOSIS — M2550 Pain in unspecified joint: Secondary | ICD-10-CM | POA: Diagnosis not present

## 2020-04-27 DIAGNOSIS — R4182 Altered mental status, unspecified: Secondary | ICD-10-CM | POA: Diagnosis not present

## 2020-04-27 DIAGNOSIS — F29 Unspecified psychosis not due to a substance or known physiological condition: Secondary | ICD-10-CM | POA: Diagnosis not present

## 2020-04-27 NOTE — ED Provider Notes (Signed)
0805: Patient has been in green room for over 8 hours through the night.  No report received by another provider. Chart reviewed to obtain more history.   Per RN patient was discharged last night but [facility] could not drop off patient at [facility] yesterday, returned to ER.  Family to come pick up patient in the AM to return to facility.   Patient evaluated, asleep in no distress. Equal chest rise/fall on cardiac and pulse ox monitoring spo2 >94% last BP 140/62.    Will order regular diet.   Unclear at this this if any efforts have been made to call family or [facility] since patient returned to ER. I will attempt to call family and/or facility  0825: Called son x 1 no answer, left voicemail. Called [facility] x [facility] no answer, left voicemail.  I have consulted case management for their assistance.   0902: Spoke to son Daphene Calamity only has a Marine scientist at 11 am, asks to send patient back at that time. Spoke to [place] with CM who states CM will facilitate transport to [place] around 11 am. RN [address]    Arlean Hopping 04/27/20 5974    Margette Fast, MD 04/28/20 1349

## 2020-04-27 NOTE — Progress Notes (Signed)
CSW spoke with Levada Dy with [employer] administration. Per Levada Dy patient was taken to their skilled nursing facility instead of the independent living apartments and they were not prepared to take her. CSW reviewed and noted it was a miscommunication and discussed with patient. Per patient she has keys to her apartment and wanted to go back to her apartment. CSW informed patient's son has contacted her home care to be there at [place].

## 2020-04-27 NOTE — ED Notes (Signed)
Called [facility] for transport.  

## 2020-05-02 DIAGNOSIS — K5900 Constipation, unspecified: Secondary | ICD-10-CM | POA: Diagnosis not present

## 2020-05-02 DIAGNOSIS — H04123 Dry eye syndrome of bilateral lacrimal glands: Secondary | ICD-10-CM | POA: Diagnosis not present

## 2020-05-02 DIAGNOSIS — F0151 Vascular dementia with behavioral disturbance: Secondary | ICD-10-CM | POA: Diagnosis not present

## 2020-05-02 DIAGNOSIS — Z950 Presence of cardiac pacemaker: Secondary | ICD-10-CM | POA: Diagnosis not present

## 2020-05-02 DIAGNOSIS — E039 Hypothyroidism, unspecified: Secondary | ICD-10-CM | POA: Diagnosis not present

## 2020-05-02 DIAGNOSIS — E785 Hyperlipidemia, unspecified: Secondary | ICD-10-CM | POA: Diagnosis not present

## 2020-05-02 DIAGNOSIS — M6281 Muscle weakness (generalized): Secondary | ICD-10-CM | POA: Diagnosis not present

## 2020-05-02 DIAGNOSIS — S0191XA Laceration without foreign body of unspecified part of head, initial encounter: Secondary | ICD-10-CM | POA: Diagnosis not present

## 2020-05-02 DIAGNOSIS — R296 Repeated falls: Secondary | ICD-10-CM | POA: Diagnosis not present

## 2020-05-05 DIAGNOSIS — M6281 Muscle weakness (generalized): Secondary | ICD-10-CM | POA: Diagnosis not present

## 2020-05-05 DIAGNOSIS — Z950 Presence of cardiac pacemaker: Secondary | ICD-10-CM | POA: Diagnosis not present

## 2020-05-05 DIAGNOSIS — S0191XA Laceration without foreign body of unspecified part of head, initial encounter: Secondary | ICD-10-CM | POA: Diagnosis not present

## 2020-05-05 DIAGNOSIS — H04123 Dry eye syndrome of bilateral lacrimal glands: Secondary | ICD-10-CM | POA: Diagnosis not present

## 2020-05-05 DIAGNOSIS — E039 Hypothyroidism, unspecified: Secondary | ICD-10-CM | POA: Diagnosis not present

## 2020-05-05 DIAGNOSIS — R296 Repeated falls: Secondary | ICD-10-CM | POA: Diagnosis not present

## 2020-05-05 DIAGNOSIS — E785 Hyperlipidemia, unspecified: Secondary | ICD-10-CM | POA: Diagnosis not present

## 2020-05-05 DIAGNOSIS — K5900 Constipation, unspecified: Secondary | ICD-10-CM | POA: Diagnosis not present

## 2020-05-07 DIAGNOSIS — K5900 Constipation, unspecified: Secondary | ICD-10-CM | POA: Diagnosis not present

## 2020-05-07 DIAGNOSIS — E785 Hyperlipidemia, unspecified: Secondary | ICD-10-CM | POA: Diagnosis not present

## 2020-05-07 DIAGNOSIS — F0151 Vascular dementia with behavioral disturbance: Secondary | ICD-10-CM | POA: Diagnosis not present

## 2020-05-07 DIAGNOSIS — S0191XA Laceration without foreign body of unspecified part of head, initial encounter: Secondary | ICD-10-CM | POA: Diagnosis not present

## 2020-05-07 DIAGNOSIS — H04123 Dry eye syndrome of bilateral lacrimal glands: Secondary | ICD-10-CM | POA: Diagnosis not present

## 2020-05-07 DIAGNOSIS — R296 Repeated falls: Secondary | ICD-10-CM | POA: Diagnosis not present

## 2020-05-07 DIAGNOSIS — M6281 Muscle weakness (generalized): Secondary | ICD-10-CM | POA: Diagnosis not present

## 2020-05-07 DIAGNOSIS — E039 Hypothyroidism, unspecified: Secondary | ICD-10-CM | POA: Diagnosis not present

## 2020-05-07 DIAGNOSIS — Z950 Presence of cardiac pacemaker: Secondary | ICD-10-CM | POA: Diagnosis not present

## 2020-05-08 DIAGNOSIS — H04123 Dry eye syndrome of bilateral lacrimal glands: Secondary | ICD-10-CM | POA: Diagnosis not present

## 2020-05-08 DIAGNOSIS — M6281 Muscle weakness (generalized): Secondary | ICD-10-CM | POA: Diagnosis not present

## 2020-05-08 DIAGNOSIS — F0151 Vascular dementia with behavioral disturbance: Secondary | ICD-10-CM | POA: Diagnosis not present

## 2020-05-08 DIAGNOSIS — S0191XA Laceration without foreign body of unspecified part of head, initial encounter: Secondary | ICD-10-CM | POA: Diagnosis not present

## 2020-05-08 DIAGNOSIS — R54 Age-related physical debility: Secondary | ICD-10-CM | POA: Diagnosis not present

## 2020-05-08 DIAGNOSIS — E785 Hyperlipidemia, unspecified: Secondary | ICD-10-CM | POA: Diagnosis not present

## 2020-05-08 DIAGNOSIS — E039 Hypothyroidism, unspecified: Secondary | ICD-10-CM | POA: Diagnosis not present

## 2020-05-08 DIAGNOSIS — K5900 Constipation, unspecified: Secondary | ICD-10-CM | POA: Diagnosis not present

## 2020-05-08 DIAGNOSIS — R296 Repeated falls: Secondary | ICD-10-CM | POA: Diagnosis not present

## 2020-05-08 DIAGNOSIS — Z950 Presence of cardiac pacemaker: Secondary | ICD-10-CM | POA: Diagnosis not present

## 2020-05-14 DIAGNOSIS — M6281 Muscle weakness (generalized): Secondary | ICD-10-CM | POA: Diagnosis not present

## 2020-05-14 DIAGNOSIS — R54 Age-related physical debility: Secondary | ICD-10-CM | POA: Diagnosis not present

## 2020-05-14 DIAGNOSIS — R296 Repeated falls: Secondary | ICD-10-CM | POA: Diagnosis not present

## 2020-05-14 DIAGNOSIS — F0151 Vascular dementia with behavioral disturbance: Secondary | ICD-10-CM | POA: Diagnosis not present

## 2020-05-14 DIAGNOSIS — K5900 Constipation, unspecified: Secondary | ICD-10-CM | POA: Diagnosis not present

## 2020-05-14 DIAGNOSIS — E785 Hyperlipidemia, unspecified: Secondary | ICD-10-CM | POA: Diagnosis not present

## 2020-05-14 DIAGNOSIS — Z950 Presence of cardiac pacemaker: Secondary | ICD-10-CM | POA: Diagnosis not present

## 2020-05-14 DIAGNOSIS — S0191XA Laceration without foreign body of unspecified part of head, initial encounter: Secondary | ICD-10-CM | POA: Diagnosis not present

## 2020-05-14 DIAGNOSIS — E039 Hypothyroidism, unspecified: Secondary | ICD-10-CM | POA: Diagnosis not present

## 2020-05-14 DIAGNOSIS — H04123 Dry eye syndrome of bilateral lacrimal glands: Secondary | ICD-10-CM | POA: Diagnosis not present

## 2020-05-23 ENCOUNTER — Telehealth: Payer: Self-pay | Admitting: Emergency Medicine

## 2020-05-23 ENCOUNTER — Telehealth: Payer: Self-pay

## 2020-05-23 NOTE — Telephone Encounter (Signed)
[employer] alert received- Pt device has not communicated in more than 21 days.    Pt contact #'s on record are disconnected #.  Spoke with pt ED, son Herbie [place] Grinage.  He reports pt has moved to a memory care unit.  He is unsure of the location of the remote monitor.  He is visiting patient tomorrow and will check for device.

## 2020-06-12 DIAGNOSIS — E039 Hypothyroidism, unspecified: Secondary | ICD-10-CM | POA: Diagnosis not present

## 2020-06-19 NOTE — Telephone Encounter (Signed)
Following up in regard to [employer] monitor. Son states he has contacted the facility the patients is at about the monitor.   [facility] [phone].  Called facility, voicemail answered, LMOVM to return call to device clinic. Direct phone number provided.

## 2020-06-20 ENCOUNTER — Telehealth: Payer: Self-pay | Admitting: Internal Medicine

## 2020-06-20 ENCOUNTER — Encounter: Payer: Self-pay | Admitting: Internal Medicine

## 2020-06-20 ENCOUNTER — Other Ambulatory Visit: Payer: Self-pay

## 2020-06-20 ENCOUNTER — Ambulatory Visit (INDEPENDENT_AMBULATORY_CARE_PROVIDER_SITE_OTHER): Payer: Medicare Other | Admitting: Internal Medicine

## 2020-06-20 VITALS — BP 112/70 | HR 84 | Ht 60.0 in | Wt 145.2 lb

## 2020-06-20 DIAGNOSIS — R001 Bradycardia, unspecified: Secondary | ICD-10-CM

## 2020-06-20 DIAGNOSIS — Z950 Presence of cardiac pacemaker: Secondary | ICD-10-CM

## 2020-06-20 LAB — CUP PACEART INCLINIC DEVICE CHECK
Date Time Interrogation Session: 20211217165352
Implantable Lead Implant Date: 20190923
Implantable Lead Implant Date: 20190923
Implantable Lead Location: 753859
Implantable Lead Location: 753860
Implantable Lead Model: 377
Implantable Lead Model: 377
Implantable Lead Serial Number: 80773242
Implantable Lead Serial Number: 80830530
Implantable Pulse Generator Implant Date: 20190923
Lead Channel Impedance Value: 487 Ohm
Lead Channel Impedance Value: 546 Ohm
Lead Channel Pacing Threshold Amplitude: 0.6 V
Lead Channel Pacing Threshold Amplitude: 0.6 V
Lead Channel Pacing Threshold Amplitude: 0.7 V
Lead Channel Pacing Threshold Amplitude: 1.2 V
Lead Channel Pacing Threshold Amplitude: 1.2 V
Lead Channel Pacing Threshold Amplitude: 1.2 V
Lead Channel Pacing Threshold Pulse Width: 0.4 ms
Lead Channel Pacing Threshold Pulse Width: 0.4 ms
Lead Channel Pacing Threshold Pulse Width: 0.4 ms
Lead Channel Pacing Threshold Pulse Width: 0.4 ms
Lead Channel Pacing Threshold Pulse Width: 0.4 ms
Lead Channel Pacing Threshold Pulse Width: 0.4 ms
Lead Channel Sensing Intrinsic Amplitude: 3.9 mV
Lead Channel Sensing Intrinsic Amplitude: 4 mV
Lead Channel Sensing Intrinsic Amplitude: 5.9 mV
Lead Channel Sensing Intrinsic Amplitude: 6.9 mV
Lead Channel Setting Pacing Amplitude: 2 V
Lead Channel Setting Pacing Amplitude: 2 V
Lead Channel Setting Pacing Pulse Width: 0.4 ms
Pulse Gen Model: 407145
Pulse Gen Serial Number: 69439851

## 2020-06-20 NOTE — Telephone Encounter (Signed)
Son notified new Bi[employer]ik monitor provided and sent back to facility with patient in care of staff with patient at appointment.

## 2020-06-20 NOTE — Progress Notes (Signed)
HPI Mrs. Grunert returns today for followup of her ILR and syncope. She is a pleasant slightly demented 86 yo woman with syncope s/p ILR who had a recurrent syncopal episode and was found on her ILR to have an episode of bradycardia with HR"s in the 30 range. She underwent PPM insertion. In the interim she has done well with no syncope. I suspect that her dementia has progressed. Allergies  Allergen Reactions   Acrylic Polymer Other (See Comments)    Blisters inside mouth    Chlorzoxazone Other (See Comments)    Reaction not noted, but the patient's paperwork states that she is "allergic to Parafon Forte/Chlorzoxazone"   Lipitor [Atorvastatin Calcium] Other (See Comments)    Myalgias and muscle aches    Orudis Kt Other (See Comments)        Sulfa Antibiotics Itching   Amoxicillin Itching and Rash    Has patient had a PCN reaction causing immediate rash, facial/tongue/throat swelling, SOB or lightheadedness with hypotension: Yes Has patient had a PCN reaction causing severe rash involving mucus membranes or skin necrosis: No Has patient had a PCN reaction that required hospitalization: No Has patient had a PCN reaction occurring within the last 10 years: No If all of the above answers are "NO", then may proceed with Cephalosporin use.      Current Outpatient Medications  Medication Sig Dispense Refill   acetaminophen (TYLENOL) 325 MG tablet Take 2 tablets (650 mg total) by mouth every 4 (four) hours as needed for mild pain or fever. 30 tablet 2   Calcium Citrate (CITRACAL PO) Take 1 tablet by mouth daily.      cholecalciferol (VITAMIN D) 1000 UNITS tablet Take 1,000 Units by mouth daily.      co-enzyme Q-10 30 MG capsule Take 100 mg by mouth daily.      donepezil (ARICEPT) 5 MG tablet Take 5 mg by mouth daily.     levothyroxine (SYNTHROID) 75 MCG tablet Take 75 mcg by mouth daily before breakfast.     Multiple Vitamin (MULTIVITAMIN) capsule Take 1 capsule by mouth  daily.     neomycin-polymyxin-hydrocortisone (CORTISPORIN) OTIC solution Apply 1-2 drops to right great toe covered with bandaid every other day 10 mL 0   polyethylene glycol (MIRALAX / GLYCOLAX) packet Take 17 g by mouth daily as needed for mild constipation. 14 each 0   simvastatin (ZOCOR) 20 MG tablet Take 20 mg by mouth every evening.     tetrahydrozoline 0.05 % ophthalmic solution Place 3 drops into both eyes daily as needed (For dry eyes).      No current facility-administered medications for this visit.     Past Medical History:  Diagnosis Date   Anxiety    Chronic headaches    history of   DDD (degenerative disc disease) 05/24/2013   Degenerative disc disease    Dyslipidemia    Headache(784.0): chronic 05/24/2013   Heart palpitations    Hypothyroidism    Mitral valve prolapse    MVP (mitral valve prolapse) 05/24/2013   PAC (premature atrial contraction)    Presence of permanent cardiac pacemaker 03/27/2018   Spinal stenosis    TMJ syndrome     ROS:   All systems reviewed and negative except as noted in the HPI.   Past Surgical History:  Procedure Laterality Date   APPENDECTOMY     BACK SURGERY     BREAST BIOPSY  3 times   BREAST EXCISIONAL BIOPSY Right 1970  benign   BREAST EXCISIONAL BIOPSY Left 1950   2 benign   BREAST EXCISIONAL BIOPSY Right 1950   benign   CHOLECYSTECTOMY     LOOP RECORDER INSERTION N/A 01/17/2018   Procedure: LOOP RECORDER INSERTION;  Surgeon: Evans Lance, MD;  Location: [facility];  Service: Cardiovascular;  Laterality: N/A;   LOOP RECORDER REMOVAL N/A 03/27/2018   Procedure: LOOP RECORDER REMOVAL;  Surgeon: Evans Lance, MD;  Location: [facility] [facility];  Service: Cardiovascular;  Laterality: N/A;   LUMBAR MICRODISCECTOMY     PACEMAKER IMPLANT N/A 03/27/2018   Procedure: PACEMAKER IMPLANT;  Surgeon: Evans Lance, MD;  Location: [place] [facility];  Service: Cardiovascular;  Laterality:  N/A;   TONSILLECTOMY       Family History  Problem Relation Age of Onset   Heart failure Mother 47   Stroke Mother    Heart disease Father    Melanoma Child      Social History   Socioeconomic History   Marital status: Widowed    Spouse name: Not on file   Number of children: Not on file   Years of education: Not on file   Highest education level: Not on file  Occupational History   Occupation: retired Network engineer  Tobacco Use   Smoking status: Former Smoker    Packs/day: 0.50    Types: Cigarettes    Quit date: 10/03/1973    Years since quitting: 46.7   Smokeless tobacco: Never Used  Vaping Use   Vaping Use: Never used  Substance and Sexual Activity   Alcohol use: Yes    Alcohol/week: 3.0 standard drinks    Types: 3 Standard drinks or equivalent per week    Comment: 4 glasses wine per week   Drug use: No   Sexual activity: Not on file  Other Topics Concern   Not on file  Social History Narrative   Not on file   Social Determinants of Health   Financial Resource Strain: Not on file  Food Insecurity: Not on file  Transportation Needs: Not on file  Physical Activity: Not on file  Stress: Not on file  Social Connections: Not on file  Intimate Partner Violence: Not on file     BP 112/70    Pulse 84    Ht 5' (1.524 m)    Wt 145 lb 3.2 oz (65.9 kg)    SpO2 99%    BMI 28.36 kg/m   Physical Exam:  Well appearing NAD HEENT: Unremarkable Neck:  No JVD, no thyromegally Lymphatics:  No adenopathy Back:  No CVA tenderness Lungs:  Clear with no wheezes HEART:  Regular rate rhythm, no murmurs, no rubs, no clicks Abd:  soft, positive bowel sounds, no organomegally, no rebound, no guarding Ext:  2 plus pulses, no edema, no cyanosis, no clubbing Skin:  No rashes no nodules Neuro:  CN II through XII intact, motor grossly intact  DEVICE  Normal device function.  See PaceArt for details.   Assess/Plan: 1. Sinus node dysfunction - she is doing  well,s /p PPM insertion. 2. PPM - her Biotronik DDD PM is working normally. 3.syncope - she has not had any spells since her PPM was placed. 4. Dementia - she does not appear anxious. She will continue aricept.  Carleene Overlie Syeda Prickett,MD

## 2020-06-20 NOTE — Telephone Encounter (Signed)
Patient has an appt today and the son would like for Dr. Lovena Le to send a [employer] back with her. Please call back to discuss

## 2020-06-20 NOTE — Patient Instructions (Signed)
Medication Instructions:  Your physician recommends that you continue on your current medications as directed. Please refer to the Current Medication list given to you today.  Labwork: None ordered.  Testing/Procedures: None ordered.  Follow-Up: Your physician wants you to follow-up in: one year with Dr. Lovena Le.   You will receive a reminder letter in the mail two months in advance. If you don't receive a letter, please call our office to schedule the follow-up appointment.  Remote monitoring is used to monitor your Pacemaker from home. This monitoring reduces the number of office visits required to check your device to one time per year. It allows [place] to keep an eye on the functioning of your device to ensure it is working properly. You are scheduled for a device check from home on 07/08/2020. You may send your transmission at any time that day. If you have a wireless device, the transmission will be sent automatically. After your physician reviews your transmission, you will receive a postcard with your next transmission date.  Any Other Special Instructions Will Be Listed Below (If Applicable).  If you need a refill on your cardiac medications before your next appointment, please call your pharmacy.

## 2020-06-20 NOTE — Telephone Encounter (Signed)
LMOM  For son. Informed concerns could be addressed at appointment today if he does not call back before. Bi[employer] rep contacted about need for monitor for patient.

## 2020-06-20 NOTE — Telephone Encounter (Signed)
On made aware that Bi[employer]ronik notified patient needs new monitor and arrangements will bemade to get one to the facility for patient.

## 2020-07-08 ENCOUNTER — Ambulatory Visit (INDEPENDENT_AMBULATORY_CARE_PROVIDER_SITE_OTHER): Payer: Medicare Other

## 2020-07-08 DIAGNOSIS — R001 Bradycardia, unspecified: Secondary | ICD-10-CM

## 2020-07-08 LAB — CUP PACEART REMOTE DEVICE CHECK
Date Time Interrogation Session: 20220104082933
Implantable Lead Implant Date: 20190923
Implantable Lead Implant Date: 20190923
Implantable Lead Location: 753859
Implantable Lead Location: 753860
Implantable Lead Model: 377
Implantable Lead Model: 377
Implantable Lead Serial Number: 80773242
Implantable Lead Serial Number: 80830530
Implantable Pulse Generator Implant Date: 20190923
Pulse Gen Model: 407145
Pulse Gen Serial Number: 69439851

## 2020-07-22 NOTE — Progress Notes (Signed)
Remote pacemaker transmission.   

## 2020-08-29 DIAGNOSIS — E559 Vitamin D deficiency, unspecified: Secondary | ICD-10-CM | POA: Diagnosis not present

## 2020-08-29 DIAGNOSIS — F0390 Unspecified dementia without behavioral disturbance: Secondary | ICD-10-CM | POA: Diagnosis not present

## 2020-08-29 DIAGNOSIS — M6281 Muscle weakness (generalized): Secondary | ICD-10-CM | POA: Diagnosis not present

## 2020-08-29 DIAGNOSIS — Z950 Presence of cardiac pacemaker: Secondary | ICD-10-CM | POA: Diagnosis not present

## 2020-08-29 DIAGNOSIS — E039 Hypothyroidism, unspecified: Secondary | ICD-10-CM | POA: Diagnosis not present

## 2020-08-29 DIAGNOSIS — E785 Hyperlipidemia, unspecified: Secondary | ICD-10-CM | POA: Diagnosis not present

## 2020-09-01 DIAGNOSIS — Z79899 Other long term (current) drug therapy: Secondary | ICD-10-CM | POA: Diagnosis not present

## 2020-09-02 DIAGNOSIS — R946 Abnormal results of thyroid function studies: Secondary | ICD-10-CM | POA: Diagnosis not present

## 2020-09-02 DIAGNOSIS — R5383 Other fatigue: Secondary | ICD-10-CM | POA: Diagnosis not present

## 2020-09-02 DIAGNOSIS — E039 Hypothyroidism, unspecified: Secondary | ICD-10-CM | POA: Diagnosis not present

## 2020-09-30 DIAGNOSIS — B351 Tinea unguium: Secondary | ICD-10-CM | POA: Diagnosis not present

## 2020-09-30 DIAGNOSIS — M2041 Other hammer toe(s) (acquired), right foot: Secondary | ICD-10-CM | POA: Diagnosis not present

## 2020-09-30 DIAGNOSIS — M79675 Pain in left toe(s): Secondary | ICD-10-CM | POA: Diagnosis not present

## 2020-09-30 DIAGNOSIS — R262 Difficulty in walking, not elsewhere classified: Secondary | ICD-10-CM | POA: Diagnosis not present

## 2020-09-30 DIAGNOSIS — M79674 Pain in right toe(s): Secondary | ICD-10-CM | POA: Diagnosis not present

## 2020-09-30 DIAGNOSIS — I739 Peripheral vascular disease, unspecified: Secondary | ICD-10-CM | POA: Diagnosis not present

## 2020-09-30 DIAGNOSIS — M2042 Other hammer toe(s) (acquired), left foot: Secondary | ICD-10-CM | POA: Diagnosis not present

## 2020-09-30 DIAGNOSIS — L603 Nail dystrophy: Secondary | ICD-10-CM | POA: Diagnosis not present

## 2020-10-07 ENCOUNTER — Ambulatory Visit (INDEPENDENT_AMBULATORY_CARE_PROVIDER_SITE_OTHER): Payer: Medicare Other

## 2020-10-07 DIAGNOSIS — R55 Syncope and collapse: Secondary | ICD-10-CM

## 2020-10-07 LAB — CUP PACEART REMOTE DEVICE CHECK
Date Time Interrogation Session: 20220405071049
Implantable Lead Implant Date: 20190923
Implantable Lead Implant Date: 20190923
Implantable Lead Location: 753859
Implantable Lead Location: 753860
Implantable Lead Model: 377
Implantable Lead Model: 377
Implantable Lead Serial Number: 80773242
Implantable Lead Serial Number: 80830530
Implantable Pulse Generator Implant Date: 20190923
Pulse Gen Model: 407145
Pulse Gen Serial Number: 69439851

## 2020-10-16 DIAGNOSIS — Z23 Encounter for immunization: Secondary | ICD-10-CM | POA: Diagnosis not present

## 2020-10-17 NOTE — Progress Notes (Signed)
Remote pacemaker transmission.   

## 2020-12-03 ENCOUNTER — Telehealth: Payer: Self-pay

## 2020-12-03 NOTE — Telephone Encounter (Signed)
[employer] alert received today for failure to send transmission today. Unsuccessful phone call to patient however was able to reach son Shahrzad Koble (on [place]). Son states patient now resides at [facility] in the dementia care unit. Spoke with Quincy Carnes who acknowledges remote transmitter was unplugged last night secondary to dementia patients being moved today to new building. As soon as move is complete remote transmitter will be plugged into patients new room. Will continue to monitor for received transmission.

## 2020-12-08 ENCOUNTER — Telehealth: Payer: Self-pay | Admitting: Emergency Medicine

## 2020-12-08 NOTE — Telephone Encounter (Signed)
[employer] transmission received on 12/07/20.

## 2020-12-16 DIAGNOSIS — F0390 Unspecified dementia without behavioral disturbance: Secondary | ICD-10-CM | POA: Diagnosis not present

## 2020-12-16 DIAGNOSIS — Z6827 Body mass index (BMI) 27.0-27.9, adult: Secondary | ICD-10-CM | POA: Diagnosis not present

## 2020-12-16 DIAGNOSIS — Z950 Presence of cardiac pacemaker: Secondary | ICD-10-CM | POA: Diagnosis not present

## 2020-12-16 DIAGNOSIS — E559 Vitamin D deficiency, unspecified: Secondary | ICD-10-CM | POA: Diagnosis not present

## 2020-12-16 DIAGNOSIS — E039 Hypothyroidism, unspecified: Secondary | ICD-10-CM | POA: Diagnosis not present

## 2020-12-22 DIAGNOSIS — M25561 Pain in right knee: Secondary | ICD-10-CM | POA: Diagnosis not present

## 2020-12-22 DIAGNOSIS — M1711 Unilateral primary osteoarthritis, right knee: Secondary | ICD-10-CM | POA: Diagnosis not present

## 2020-12-31 DIAGNOSIS — E039 Hypothyroidism, unspecified: Secondary | ICD-10-CM | POA: Diagnosis not present

## 2020-12-31 DIAGNOSIS — E559 Vitamin D deficiency, unspecified: Secondary | ICD-10-CM | POA: Diagnosis not present

## 2020-12-31 DIAGNOSIS — Z79899 Other long term (current) drug therapy: Secondary | ICD-10-CM | POA: Diagnosis not present

## 2020-12-31 DIAGNOSIS — M6281 Muscle weakness (generalized): Secondary | ICD-10-CM | POA: Diagnosis not present

## 2021-01-06 ENCOUNTER — Ambulatory Visit (INDEPENDENT_AMBULATORY_CARE_PROVIDER_SITE_OTHER): Payer: Medicare Other

## 2021-01-06 DIAGNOSIS — R55 Syncope and collapse: Secondary | ICD-10-CM

## 2021-01-06 LAB — CUP PACEART REMOTE DEVICE CHECK
Battery Remaining Percentage: 80 %
Brady Statistic AP VP Percent: 0 %
Brady Statistic AP VS Percent: 59 %
Brady Statistic AS VP Percent: 0 %
Brady Statistic AS VS Percent: 41 %
Brady Statistic RA Percent Paced: 59 %
Brady Statistic RV Percent Paced: 0 %
Date Time Interrogation Session: 20220705013800
Implantable Lead Implant Date: 20190923
Implantable Lead Implant Date: 20190923
Implantable Lead Location: 753859
Implantable Lead Location: 753860
Implantable Lead Model: 377
Implantable Lead Model: 377
Implantable Lead Serial Number: 80773242
Implantable Lead Serial Number: 80830530
Implantable Pulse Generator Implant Date: 20190923
Lead Channel Impedance Value: 479 Ohm
Lead Channel Impedance Value: 515 Ohm
Lead Channel Pacing Threshold Amplitude: 0.7 V
Lead Channel Pacing Threshold Amplitude: 1.1 V
Lead Channel Pacing Threshold Pulse Width: 0.4 ms
Lead Channel Pacing Threshold Pulse Width: 0.4 ms
Lead Channel Setting Pacing Amplitude: 2 V
Lead Channel Setting Pacing Amplitude: 2 V
Lead Channel Setting Pacing Pulse Width: 0.4 ms
Pulse Gen Model: 407145
Pulse Gen Serial Number: 69439851

## 2021-01-08 DIAGNOSIS — F0150 Vascular dementia without behavioral disturbance: Secondary | ICD-10-CM | POA: Diagnosis not present

## 2021-01-08 DIAGNOSIS — E039 Hypothyroidism, unspecified: Secondary | ICD-10-CM | POA: Diagnosis not present

## 2021-01-08 DIAGNOSIS — R7989 Other specified abnormal findings of blood chemistry: Secondary | ICD-10-CM | POA: Diagnosis not present

## 2021-01-23 NOTE — Progress Notes (Signed)
Remote pacemaker transmission.   

## 2021-02-19 DIAGNOSIS — E039 Hypothyroidism, unspecified: Secondary | ICD-10-CM | POA: Diagnosis not present

## 2021-03-19 DIAGNOSIS — Z23 Encounter for immunization: Secondary | ICD-10-CM | POA: Diagnosis not present

## 2021-04-07 ENCOUNTER — Ambulatory Visit (INDEPENDENT_AMBULATORY_CARE_PROVIDER_SITE_OTHER): Payer: Medicare Other

## 2021-04-07 DIAGNOSIS — I341 Nonrheumatic mitral (valve) prolapse: Secondary | ICD-10-CM

## 2021-04-07 LAB — CUP PACEART REMOTE DEVICE CHECK
Date Time Interrogation Session: 20221004105154
Implantable Lead Implant Date: 20190923
Implantable Lead Implant Date: 20190923
Implantable Lead Location: 753859
Implantable Lead Location: 753860
Implantable Lead Model: 377
Implantable Lead Model: 377
Implantable Lead Serial Number: 80773242
Implantable Lead Serial Number: 80830530
Implantable Pulse Generator Implant Date: 20190923
Pulse Gen Model: 407145
Pulse Gen Serial Number: 69439851

## 2021-04-15 NOTE — Progress Notes (Signed)
Remote pacemaker transmission.   

## 2021-05-01 DIAGNOSIS — L0100 Impetigo, unspecified: Secondary | ICD-10-CM | POA: Diagnosis not present

## 2021-05-01 DIAGNOSIS — F0390 Unspecified dementia without behavioral disturbance: Secondary | ICD-10-CM | POA: Diagnosis not present

## 2021-05-01 DIAGNOSIS — S0031XA Abrasion of nose, initial encounter: Secondary | ICD-10-CM | POA: Diagnosis not present

## 2021-05-14 DIAGNOSIS — R600 Localized edema: Secondary | ICD-10-CM | POA: Diagnosis not present

## 2021-05-14 DIAGNOSIS — S0031XD Abrasion of nose, subsequent encounter: Secondary | ICD-10-CM | POA: Diagnosis not present

## 2021-05-14 DIAGNOSIS — R2243 Localized swelling, mass and lump, lower limb, bilateral: Secondary | ICD-10-CM | POA: Diagnosis not present

## 2021-05-14 DIAGNOSIS — F0390 Unspecified dementia without behavioral disturbance: Secondary | ICD-10-CM | POA: Diagnosis not present

## 2021-05-19 DIAGNOSIS — R600 Localized edema: Secondary | ICD-10-CM | POA: Diagnosis not present

## 2021-05-19 DIAGNOSIS — F0150 Vascular dementia without behavioral disturbance: Secondary | ICD-10-CM | POA: Diagnosis not present

## 2021-05-19 DIAGNOSIS — R531 Weakness: Secondary | ICD-10-CM | POA: Diagnosis not present

## 2021-05-19 DIAGNOSIS — W19XXXA Unspecified fall, initial encounter: Secondary | ICD-10-CM | POA: Diagnosis not present

## 2021-05-20 DIAGNOSIS — M6281 Muscle weakness (generalized): Secondary | ICD-10-CM | POA: Diagnosis not present

## 2021-05-20 DIAGNOSIS — E559 Vitamin D deficiency, unspecified: Secondary | ICD-10-CM | POA: Diagnosis not present

## 2021-05-20 DIAGNOSIS — F0390 Unspecified dementia without behavioral disturbance: Secondary | ICD-10-CM | POA: Diagnosis not present

## 2021-05-20 DIAGNOSIS — Z79899 Other long term (current) drug therapy: Secondary | ICD-10-CM | POA: Diagnosis not present

## 2021-05-21 DIAGNOSIS — M6281 Muscle weakness (generalized): Secondary | ICD-10-CM | POA: Diagnosis not present

## 2021-05-21 DIAGNOSIS — F0390 Unspecified dementia without behavioral disturbance: Secondary | ICD-10-CM | POA: Diagnosis not present

## 2021-05-22 DIAGNOSIS — F0390 Unspecified dementia without behavioral disturbance: Secondary | ICD-10-CM | POA: Diagnosis not present

## 2021-05-22 DIAGNOSIS — M6281 Muscle weakness (generalized): Secondary | ICD-10-CM | POA: Diagnosis not present

## 2021-05-25 DIAGNOSIS — M6281 Muscle weakness (generalized): Secondary | ICD-10-CM | POA: Diagnosis not present

## 2021-05-25 DIAGNOSIS — F0390 Unspecified dementia without behavioral disturbance: Secondary | ICD-10-CM | POA: Diagnosis not present

## 2021-05-26 DIAGNOSIS — F0390 Unspecified dementia without behavioral disturbance: Secondary | ICD-10-CM | POA: Diagnosis not present

## 2021-05-26 DIAGNOSIS — M6281 Muscle weakness (generalized): Secondary | ICD-10-CM | POA: Diagnosis not present

## 2021-05-27 DIAGNOSIS — F0390 Unspecified dementia without behavioral disturbance: Secondary | ICD-10-CM | POA: Diagnosis not present

## 2021-05-27 DIAGNOSIS — M6281 Muscle weakness (generalized): Secondary | ICD-10-CM | POA: Diagnosis not present

## 2021-05-29 DIAGNOSIS — F0390 Unspecified dementia without behavioral disturbance: Secondary | ICD-10-CM | POA: Diagnosis not present

## 2021-05-29 DIAGNOSIS — M6281 Muscle weakness (generalized): Secondary | ICD-10-CM | POA: Diagnosis not present

## 2021-05-30 DIAGNOSIS — M6281 Muscle weakness (generalized): Secondary | ICD-10-CM | POA: Diagnosis not present

## 2021-05-30 DIAGNOSIS — F0390 Unspecified dementia without behavioral disturbance: Secondary | ICD-10-CM | POA: Diagnosis not present

## 2021-06-01 DIAGNOSIS — F0390 Unspecified dementia without behavioral disturbance: Secondary | ICD-10-CM | POA: Diagnosis not present

## 2021-06-01 DIAGNOSIS — M6281 Muscle weakness (generalized): Secondary | ICD-10-CM | POA: Diagnosis not present

## 2021-06-02 DIAGNOSIS — F0390 Unspecified dementia without behavioral disturbance: Secondary | ICD-10-CM | POA: Diagnosis not present

## 2021-06-02 DIAGNOSIS — M6281 Muscle weakness (generalized): Secondary | ICD-10-CM | POA: Diagnosis not present

## 2021-06-03 DIAGNOSIS — F0390 Unspecified dementia without behavioral disturbance: Secondary | ICD-10-CM | POA: Diagnosis not present

## 2021-06-03 DIAGNOSIS — M6281 Muscle weakness (generalized): Secondary | ICD-10-CM | POA: Diagnosis not present

## 2021-06-04 DIAGNOSIS — R1312 Dysphagia, oropharyngeal phase: Secondary | ICD-10-CM | POA: Diagnosis not present

## 2021-06-04 DIAGNOSIS — F0390 Unspecified dementia without behavioral disturbance: Secondary | ICD-10-CM | POA: Diagnosis not present

## 2021-06-04 DIAGNOSIS — R4789 Other speech disturbances: Secondary | ICD-10-CM | POA: Diagnosis not present

## 2021-06-04 DIAGNOSIS — M6281 Muscle weakness (generalized): Secondary | ICD-10-CM | POA: Diagnosis not present

## 2021-06-05 DIAGNOSIS — F0390 Unspecified dementia without behavioral disturbance: Secondary | ICD-10-CM | POA: Diagnosis not present

## 2021-06-05 DIAGNOSIS — R4789 Other speech disturbances: Secondary | ICD-10-CM | POA: Diagnosis not present

## 2021-06-05 DIAGNOSIS — R1312 Dysphagia, oropharyngeal phase: Secondary | ICD-10-CM | POA: Diagnosis not present

## 2021-06-05 DIAGNOSIS — M6281 Muscle weakness (generalized): Secondary | ICD-10-CM | POA: Diagnosis not present

## 2021-06-08 DIAGNOSIS — F0390 Unspecified dementia without behavioral disturbance: Secondary | ICD-10-CM | POA: Diagnosis not present

## 2021-06-08 DIAGNOSIS — M6281 Muscle weakness (generalized): Secondary | ICD-10-CM | POA: Diagnosis not present

## 2021-06-08 DIAGNOSIS — R4789 Other speech disturbances: Secondary | ICD-10-CM | POA: Diagnosis not present

## 2021-06-08 DIAGNOSIS — R1312 Dysphagia, oropharyngeal phase: Secondary | ICD-10-CM | POA: Diagnosis not present

## 2021-06-09 DIAGNOSIS — R4789 Other speech disturbances: Secondary | ICD-10-CM | POA: Diagnosis not present

## 2021-06-09 DIAGNOSIS — F0390 Unspecified dementia without behavioral disturbance: Secondary | ICD-10-CM | POA: Diagnosis not present

## 2021-06-09 DIAGNOSIS — M6281 Muscle weakness (generalized): Secondary | ICD-10-CM | POA: Diagnosis not present

## 2021-06-09 DIAGNOSIS — R1312 Dysphagia, oropharyngeal phase: Secondary | ICD-10-CM | POA: Diagnosis not present

## 2021-06-10 DIAGNOSIS — R1312 Dysphagia, oropharyngeal phase: Secondary | ICD-10-CM | POA: Diagnosis not present

## 2021-06-10 DIAGNOSIS — R4789 Other speech disturbances: Secondary | ICD-10-CM | POA: Diagnosis not present

## 2021-06-10 DIAGNOSIS — M6281 Muscle weakness (generalized): Secondary | ICD-10-CM | POA: Diagnosis not present

## 2021-06-10 DIAGNOSIS — F0390 Unspecified dementia without behavioral disturbance: Secondary | ICD-10-CM | POA: Diagnosis not present

## 2021-06-11 DIAGNOSIS — F0390 Unspecified dementia without behavioral disturbance: Secondary | ICD-10-CM | POA: Diagnosis not present

## 2021-06-11 DIAGNOSIS — R4789 Other speech disturbances: Secondary | ICD-10-CM | POA: Diagnosis not present

## 2021-06-11 DIAGNOSIS — M6281 Muscle weakness (generalized): Secondary | ICD-10-CM | POA: Diagnosis not present

## 2021-06-11 DIAGNOSIS — R1312 Dysphagia, oropharyngeal phase: Secondary | ICD-10-CM | POA: Diagnosis not present

## 2021-06-12 DIAGNOSIS — F0150 Vascular dementia without behavioral disturbance: Secondary | ICD-10-CM | POA: Diagnosis not present

## 2021-06-12 DIAGNOSIS — R0989 Other specified symptoms and signs involving the circulatory and respiratory systems: Secondary | ICD-10-CM | POA: Diagnosis not present

## 2021-06-12 DIAGNOSIS — R112 Nausea with vomiting, unspecified: Secondary | ICD-10-CM | POA: Diagnosis not present

## 2021-06-12 DIAGNOSIS — J09X3 Influenza due to identified novel influenza A virus with gastrointestinal manifestations: Secondary | ICD-10-CM | POA: Diagnosis not present

## 2021-06-12 DIAGNOSIS — R051 Acute cough: Secondary | ICD-10-CM | POA: Diagnosis not present

## 2021-06-15 DIAGNOSIS — F0150 Vascular dementia without behavioral disturbance: Secondary | ICD-10-CM | POA: Diagnosis not present

## 2021-06-15 DIAGNOSIS — R051 Acute cough: Secondary | ICD-10-CM | POA: Diagnosis not present

## 2021-06-15 DIAGNOSIS — D72829 Elevated white blood cell count, unspecified: Secondary | ICD-10-CM | POA: Diagnosis not present

## 2021-06-15 DIAGNOSIS — J690 Pneumonitis due to inhalation of food and vomit: Secondary | ICD-10-CM | POA: Diagnosis not present

## 2021-06-16 DIAGNOSIS — R1312 Dysphagia, oropharyngeal phase: Secondary | ICD-10-CM | POA: Diagnosis not present

## 2021-06-16 DIAGNOSIS — M6281 Muscle weakness (generalized): Secondary | ICD-10-CM | POA: Diagnosis not present

## 2021-06-16 DIAGNOSIS — F0390 Unspecified dementia without behavioral disturbance: Secondary | ICD-10-CM | POA: Diagnosis not present

## 2021-06-16 DIAGNOSIS — R4789 Other speech disturbances: Secondary | ICD-10-CM | POA: Diagnosis not present

## 2021-06-17 DIAGNOSIS — R4789 Other speech disturbances: Secondary | ICD-10-CM | POA: Diagnosis not present

## 2021-06-17 DIAGNOSIS — F0390 Unspecified dementia without behavioral disturbance: Secondary | ICD-10-CM | POA: Diagnosis not present

## 2021-06-17 DIAGNOSIS — R1312 Dysphagia, oropharyngeal phase: Secondary | ICD-10-CM | POA: Diagnosis not present

## 2021-06-17 DIAGNOSIS — M6281 Muscle weakness (generalized): Secondary | ICD-10-CM | POA: Diagnosis not present

## 2021-06-18 DIAGNOSIS — J690 Pneumonitis due to inhalation of food and vomit: Secondary | ICD-10-CM | POA: Diagnosis not present

## 2021-06-18 DIAGNOSIS — Z79899 Other long term (current) drug therapy: Secondary | ICD-10-CM | POA: Diagnosis not present

## 2021-06-18 DIAGNOSIS — F0150 Vascular dementia without behavioral disturbance: Secondary | ICD-10-CM | POA: Diagnosis not present

## 2021-06-18 DIAGNOSIS — M6281 Muscle weakness (generalized): Secondary | ICD-10-CM | POA: Diagnosis not present

## 2021-06-18 DIAGNOSIS — D72829 Elevated white blood cell count, unspecified: Secondary | ICD-10-CM | POA: Diagnosis not present

## 2021-06-18 DIAGNOSIS — R051 Acute cough: Secondary | ICD-10-CM | POA: Diagnosis not present

## 2021-06-22 DIAGNOSIS — R1312 Dysphagia, oropharyngeal phase: Secondary | ICD-10-CM | POA: Diagnosis not present

## 2021-06-22 DIAGNOSIS — M6281 Muscle weakness (generalized): Secondary | ICD-10-CM | POA: Diagnosis not present

## 2021-06-22 DIAGNOSIS — F0390 Unspecified dementia without behavioral disturbance: Secondary | ICD-10-CM | POA: Diagnosis not present

## 2021-06-22 DIAGNOSIS — R4789 Other speech disturbances: Secondary | ICD-10-CM | POA: Diagnosis not present

## 2021-06-24 DIAGNOSIS — F0390 Unspecified dementia without behavioral disturbance: Secondary | ICD-10-CM | POA: Diagnosis not present

## 2021-06-24 DIAGNOSIS — M6281 Muscle weakness (generalized): Secondary | ICD-10-CM | POA: Diagnosis not present

## 2021-06-24 DIAGNOSIS — R4789 Other speech disturbances: Secondary | ICD-10-CM | POA: Diagnosis not present

## 2021-06-24 DIAGNOSIS — R1312 Dysphagia, oropharyngeal phase: Secondary | ICD-10-CM | POA: Diagnosis not present

## 2021-06-25 DIAGNOSIS — F0390 Unspecified dementia without behavioral disturbance: Secondary | ICD-10-CM | POA: Diagnosis not present

## 2021-06-25 DIAGNOSIS — R1312 Dysphagia, oropharyngeal phase: Secondary | ICD-10-CM | POA: Diagnosis not present

## 2021-06-25 DIAGNOSIS — R4789 Other speech disturbances: Secondary | ICD-10-CM | POA: Diagnosis not present

## 2021-06-25 DIAGNOSIS — M6281 Muscle weakness (generalized): Secondary | ICD-10-CM | POA: Diagnosis not present

## 2021-06-29 DIAGNOSIS — F0390 Unspecified dementia without behavioral disturbance: Secondary | ICD-10-CM | POA: Diagnosis not present

## 2021-06-29 DIAGNOSIS — M6281 Muscle weakness (generalized): Secondary | ICD-10-CM | POA: Diagnosis not present

## 2021-06-29 DIAGNOSIS — R4789 Other speech disturbances: Secondary | ICD-10-CM | POA: Diagnosis not present

## 2021-06-29 DIAGNOSIS — R1312 Dysphagia, oropharyngeal phase: Secondary | ICD-10-CM | POA: Diagnosis not present

## 2021-07-01 DIAGNOSIS — F0390 Unspecified dementia without behavioral disturbance: Secondary | ICD-10-CM | POA: Diagnosis not present

## 2021-07-01 DIAGNOSIS — R4789 Other speech disturbances: Secondary | ICD-10-CM | POA: Diagnosis not present

## 2021-07-01 DIAGNOSIS — R1312 Dysphagia, oropharyngeal phase: Secondary | ICD-10-CM | POA: Diagnosis not present

## 2021-07-01 DIAGNOSIS — M6281 Muscle weakness (generalized): Secondary | ICD-10-CM | POA: Diagnosis not present

## 2021-07-03 DIAGNOSIS — R1312 Dysphagia, oropharyngeal phase: Secondary | ICD-10-CM | POA: Diagnosis not present

## 2021-07-03 DIAGNOSIS — R4789 Other speech disturbances: Secondary | ICD-10-CM | POA: Diagnosis not present

## 2021-07-03 DIAGNOSIS — M6281 Muscle weakness (generalized): Secondary | ICD-10-CM | POA: Diagnosis not present

## 2021-07-03 DIAGNOSIS — F0390 Unspecified dementia without behavioral disturbance: Secondary | ICD-10-CM | POA: Diagnosis not present

## 2021-07-05 DIAGNOSIS — U071 COVID-19: Secondary | ICD-10-CM | POA: Diagnosis not present

## 2021-07-05 DIAGNOSIS — B342 Coronavirus infection, unspecified: Secondary | ICD-10-CM | POA: Diagnosis not present

## 2021-07-05 DIAGNOSIS — R918 Other nonspecific abnormal finding of lung field: Secondary | ICD-10-CM | POA: Diagnosis not present

## 2021-07-05 DIAGNOSIS — D649 Anemia, unspecified: Secondary | ICD-10-CM | POA: Diagnosis not present

## 2021-07-07 ENCOUNTER — Ambulatory Visit (INDEPENDENT_AMBULATORY_CARE_PROVIDER_SITE_OTHER): Payer: Medicare Other

## 2021-07-07 DIAGNOSIS — I495 Sick sinus syndrome: Secondary | ICD-10-CM | POA: Diagnosis not present

## 2021-07-07 LAB — CUP PACEART REMOTE DEVICE CHECK
Date Time Interrogation Session: 20230103101305
Implantable Lead Implant Date: 20190923
Implantable Lead Implant Date: 20190923
Implantable Lead Location: 753859
Implantable Lead Location: 753860
Implantable Lead Model: 377
Implantable Lead Model: 377
Implantable Lead Serial Number: 80773242
Implantable Lead Serial Number: 80830530
Implantable Pulse Generator Implant Date: 20190923
Pulse Gen Model: 407145
Pulse Gen Serial Number: 69439851

## 2021-07-08 DIAGNOSIS — M6281 Muscle weakness (generalized): Secondary | ICD-10-CM | POA: Diagnosis not present

## 2021-07-08 DIAGNOSIS — E7800 Pure hypercholesterolemia, unspecified: Secondary | ICD-10-CM | POA: Diagnosis not present

## 2021-07-08 DIAGNOSIS — E785 Hyperlipidemia, unspecified: Secondary | ICD-10-CM | POA: Diagnosis not present

## 2021-07-08 DIAGNOSIS — U071 COVID-19: Secondary | ICD-10-CM | POA: Diagnosis not present

## 2021-07-09 DIAGNOSIS — R4789 Other speech disturbances: Secondary | ICD-10-CM | POA: Diagnosis not present

## 2021-07-09 DIAGNOSIS — F0390 Unspecified dementia without behavioral disturbance: Secondary | ICD-10-CM | POA: Diagnosis not present

## 2021-07-09 DIAGNOSIS — M6281 Muscle weakness (generalized): Secondary | ICD-10-CM | POA: Diagnosis not present

## 2021-07-09 DIAGNOSIS — R1312 Dysphagia, oropharyngeal phase: Secondary | ICD-10-CM | POA: Diagnosis not present

## 2021-07-10 DIAGNOSIS — M6281 Muscle weakness (generalized): Secondary | ICD-10-CM | POA: Diagnosis not present

## 2021-07-10 DIAGNOSIS — F0390 Unspecified dementia without behavioral disturbance: Secondary | ICD-10-CM | POA: Diagnosis not present

## 2021-07-10 DIAGNOSIS — R4789 Other speech disturbances: Secondary | ICD-10-CM | POA: Diagnosis not present

## 2021-07-10 DIAGNOSIS — R1312 Dysphagia, oropharyngeal phase: Secondary | ICD-10-CM | POA: Diagnosis not present

## 2021-07-13 DIAGNOSIS — F0390 Unspecified dementia without behavioral disturbance: Secondary | ICD-10-CM | POA: Diagnosis not present

## 2021-07-13 DIAGNOSIS — R4789 Other speech disturbances: Secondary | ICD-10-CM | POA: Diagnosis not present

## 2021-07-13 DIAGNOSIS — R1312 Dysphagia, oropharyngeal phase: Secondary | ICD-10-CM | POA: Diagnosis not present

## 2021-07-13 DIAGNOSIS — M6281 Muscle weakness (generalized): Secondary | ICD-10-CM | POA: Diagnosis not present

## 2021-07-15 DIAGNOSIS — M6281 Muscle weakness (generalized): Secondary | ICD-10-CM | POA: Diagnosis not present

## 2021-07-15 DIAGNOSIS — F0390 Unspecified dementia without behavioral disturbance: Secondary | ICD-10-CM | POA: Diagnosis not present

## 2021-07-15 DIAGNOSIS — R4789 Other speech disturbances: Secondary | ICD-10-CM | POA: Diagnosis not present

## 2021-07-15 DIAGNOSIS — R1312 Dysphagia, oropharyngeal phase: Secondary | ICD-10-CM | POA: Diagnosis not present

## 2021-07-16 DIAGNOSIS — R4789 Other speech disturbances: Secondary | ICD-10-CM | POA: Diagnosis not present

## 2021-07-16 DIAGNOSIS — M6281 Muscle weakness (generalized): Secondary | ICD-10-CM | POA: Diagnosis not present

## 2021-07-16 DIAGNOSIS — F0390 Unspecified dementia without behavioral disturbance: Secondary | ICD-10-CM | POA: Diagnosis not present

## 2021-07-16 DIAGNOSIS — R1312 Dysphagia, oropharyngeal phase: Secondary | ICD-10-CM | POA: Diagnosis not present

## 2021-07-16 NOTE — Progress Notes (Signed)
Remote pacemaker transmission.   

## 2021-07-20 DIAGNOSIS — R2689 Other abnormalities of gait and mobility: Secondary | ICD-10-CM | POA: Diagnosis not present

## 2021-07-20 DIAGNOSIS — S8012XA Contusion of left lower leg, initial encounter: Secondary | ICD-10-CM | POA: Diagnosis not present

## 2021-07-20 DIAGNOSIS — M6281 Muscle weakness (generalized): Secondary | ICD-10-CM | POA: Diagnosis not present

## 2021-07-20 DIAGNOSIS — R1312 Dysphagia, oropharyngeal phase: Secondary | ICD-10-CM | POA: Diagnosis not present

## 2021-07-20 DIAGNOSIS — R4789 Other speech disturbances: Secondary | ICD-10-CM | POA: Diagnosis not present

## 2021-07-20 DIAGNOSIS — F0390 Unspecified dementia without behavioral disturbance: Secondary | ICD-10-CM | POA: Diagnosis not present

## 2021-07-22 DIAGNOSIS — R4789 Other speech disturbances: Secondary | ICD-10-CM | POA: Diagnosis not present

## 2021-07-22 DIAGNOSIS — M6281 Muscle weakness (generalized): Secondary | ICD-10-CM | POA: Diagnosis not present

## 2021-07-22 DIAGNOSIS — R1312 Dysphagia, oropharyngeal phase: Secondary | ICD-10-CM | POA: Diagnosis not present

## 2021-07-22 DIAGNOSIS — F0390 Unspecified dementia without behavioral disturbance: Secondary | ICD-10-CM | POA: Diagnosis not present

## 2021-07-24 DIAGNOSIS — R1312 Dysphagia, oropharyngeal phase: Secondary | ICD-10-CM | POA: Diagnosis not present

## 2021-07-24 DIAGNOSIS — F0390 Unspecified dementia without behavioral disturbance: Secondary | ICD-10-CM | POA: Diagnosis not present

## 2021-07-24 DIAGNOSIS — R4789 Other speech disturbances: Secondary | ICD-10-CM | POA: Diagnosis not present

## 2021-07-24 DIAGNOSIS — M6281 Muscle weakness (generalized): Secondary | ICD-10-CM | POA: Diagnosis not present

## 2021-07-27 DIAGNOSIS — R4789 Other speech disturbances: Secondary | ICD-10-CM | POA: Diagnosis not present

## 2021-07-27 DIAGNOSIS — M6281 Muscle weakness (generalized): Secondary | ICD-10-CM | POA: Diagnosis not present

## 2021-07-27 DIAGNOSIS — F0390 Unspecified dementia without behavioral disturbance: Secondary | ICD-10-CM | POA: Diagnosis not present

## 2021-07-27 DIAGNOSIS — R1312 Dysphagia, oropharyngeal phase: Secondary | ICD-10-CM | POA: Diagnosis not present

## 2021-07-29 DIAGNOSIS — R1312 Dysphagia, oropharyngeal phase: Secondary | ICD-10-CM | POA: Diagnosis not present

## 2021-07-29 DIAGNOSIS — R4789 Other speech disturbances: Secondary | ICD-10-CM | POA: Diagnosis not present

## 2021-07-29 DIAGNOSIS — F0390 Unspecified dementia without behavioral disturbance: Secondary | ICD-10-CM | POA: Diagnosis not present

## 2021-07-29 DIAGNOSIS — M6281 Muscle weakness (generalized): Secondary | ICD-10-CM | POA: Diagnosis not present

## 2021-07-30 DIAGNOSIS — M6281 Muscle weakness (generalized): Secondary | ICD-10-CM | POA: Diagnosis not present

## 2021-07-30 DIAGNOSIS — R1312 Dysphagia, oropharyngeal phase: Secondary | ICD-10-CM | POA: Diagnosis not present

## 2021-07-30 DIAGNOSIS — R4789 Other speech disturbances: Secondary | ICD-10-CM | POA: Diagnosis not present

## 2021-07-30 DIAGNOSIS — F0390 Unspecified dementia without behavioral disturbance: Secondary | ICD-10-CM | POA: Diagnosis not present

## 2021-07-31 DIAGNOSIS — M6281 Muscle weakness (generalized): Secondary | ICD-10-CM | POA: Diagnosis not present

## 2021-07-31 DIAGNOSIS — R4789 Other speech disturbances: Secondary | ICD-10-CM | POA: Diagnosis not present

## 2021-07-31 DIAGNOSIS — R1312 Dysphagia, oropharyngeal phase: Secondary | ICD-10-CM | POA: Diagnosis not present

## 2021-07-31 DIAGNOSIS — F0390 Unspecified dementia without behavioral disturbance: Secondary | ICD-10-CM | POA: Diagnosis not present

## 2021-08-03 DIAGNOSIS — R1312 Dysphagia, oropharyngeal phase: Secondary | ICD-10-CM | POA: Diagnosis not present

## 2021-08-03 DIAGNOSIS — F0390 Unspecified dementia without behavioral disturbance: Secondary | ICD-10-CM | POA: Diagnosis not present

## 2021-08-03 DIAGNOSIS — R4789 Other speech disturbances: Secondary | ICD-10-CM | POA: Diagnosis not present

## 2021-08-03 DIAGNOSIS — M6281 Muscle weakness (generalized): Secondary | ICD-10-CM | POA: Diagnosis not present

## 2021-08-04 DIAGNOSIS — R1312 Dysphagia, oropharyngeal phase: Secondary | ICD-10-CM | POA: Diagnosis not present

## 2021-08-04 DIAGNOSIS — F0390 Unspecified dementia without behavioral disturbance: Secondary | ICD-10-CM | POA: Diagnosis not present

## 2021-08-04 DIAGNOSIS — M6281 Muscle weakness (generalized): Secondary | ICD-10-CM | POA: Diagnosis not present

## 2021-08-04 DIAGNOSIS — R4789 Other speech disturbances: Secondary | ICD-10-CM | POA: Diagnosis not present

## 2021-08-05 DIAGNOSIS — R4789 Other speech disturbances: Secondary | ICD-10-CM | POA: Diagnosis not present

## 2021-08-05 DIAGNOSIS — M6281 Muscle weakness (generalized): Secondary | ICD-10-CM | POA: Diagnosis not present

## 2021-08-05 DIAGNOSIS — F0390 Unspecified dementia without behavioral disturbance: Secondary | ICD-10-CM | POA: Diagnosis not present

## 2021-08-05 DIAGNOSIS — R1312 Dysphagia, oropharyngeal phase: Secondary | ICD-10-CM | POA: Diagnosis not present

## 2021-08-07 DIAGNOSIS — F0390 Unspecified dementia without behavioral disturbance: Secondary | ICD-10-CM | POA: Diagnosis not present

## 2021-08-07 DIAGNOSIS — M6281 Muscle weakness (generalized): Secondary | ICD-10-CM | POA: Diagnosis not present

## 2021-08-07 DIAGNOSIS — R4789 Other speech disturbances: Secondary | ICD-10-CM | POA: Diagnosis not present

## 2021-08-07 DIAGNOSIS — R1312 Dysphagia, oropharyngeal phase: Secondary | ICD-10-CM | POA: Diagnosis not present

## 2021-08-10 DIAGNOSIS — M6281 Muscle weakness (generalized): Secondary | ICD-10-CM | POA: Diagnosis not present

## 2021-08-10 DIAGNOSIS — F0390 Unspecified dementia without behavioral disturbance: Secondary | ICD-10-CM | POA: Diagnosis not present

## 2021-08-10 DIAGNOSIS — R1312 Dysphagia, oropharyngeal phase: Secondary | ICD-10-CM | POA: Diagnosis not present

## 2021-08-10 DIAGNOSIS — R4789 Other speech disturbances: Secondary | ICD-10-CM | POA: Diagnosis not present

## 2021-08-11 DIAGNOSIS — R1312 Dysphagia, oropharyngeal phase: Secondary | ICD-10-CM | POA: Diagnosis not present

## 2021-08-11 DIAGNOSIS — K5900 Constipation, unspecified: Secondary | ICD-10-CM | POA: Diagnosis not present

## 2021-08-11 DIAGNOSIS — M6281 Muscle weakness (generalized): Secondary | ICD-10-CM | POA: Diagnosis not present

## 2021-08-11 DIAGNOSIS — R4789 Other speech disturbances: Secondary | ICD-10-CM | POA: Diagnosis not present

## 2021-08-11 DIAGNOSIS — F0390 Unspecified dementia without behavioral disturbance: Secondary | ICD-10-CM | POA: Diagnosis not present

## 2021-08-11 DIAGNOSIS — R140 Abdominal distension (gaseous): Secondary | ICD-10-CM | POA: Diagnosis not present

## 2021-08-11 DIAGNOSIS — R112 Nausea with vomiting, unspecified: Secondary | ICD-10-CM | POA: Diagnosis not present

## 2021-08-12 DIAGNOSIS — R1312 Dysphagia, oropharyngeal phase: Secondary | ICD-10-CM | POA: Diagnosis not present

## 2021-08-12 DIAGNOSIS — M6281 Muscle weakness (generalized): Secondary | ICD-10-CM | POA: Diagnosis not present

## 2021-08-12 DIAGNOSIS — F0390 Unspecified dementia without behavioral disturbance: Secondary | ICD-10-CM | POA: Diagnosis not present

## 2021-08-12 DIAGNOSIS — R4789 Other speech disturbances: Secondary | ICD-10-CM | POA: Diagnosis not present

## 2021-08-14 DIAGNOSIS — R1312 Dysphagia, oropharyngeal phase: Secondary | ICD-10-CM | POA: Diagnosis not present

## 2021-08-14 DIAGNOSIS — F0390 Unspecified dementia without behavioral disturbance: Secondary | ICD-10-CM | POA: Diagnosis not present

## 2021-08-14 DIAGNOSIS — R4789 Other speech disturbances: Secondary | ICD-10-CM | POA: Diagnosis not present

## 2021-08-14 DIAGNOSIS — M6281 Muscle weakness (generalized): Secondary | ICD-10-CM | POA: Diagnosis not present

## 2021-08-17 DIAGNOSIS — R1312 Dysphagia, oropharyngeal phase: Secondary | ICD-10-CM | POA: Diagnosis not present

## 2021-08-17 DIAGNOSIS — F0390 Unspecified dementia without behavioral disturbance: Secondary | ICD-10-CM | POA: Diagnosis not present

## 2021-08-17 DIAGNOSIS — M6281 Muscle weakness (generalized): Secondary | ICD-10-CM | POA: Diagnosis not present

## 2021-08-17 DIAGNOSIS — R4789 Other speech disturbances: Secondary | ICD-10-CM | POA: Diagnosis not present

## 2021-08-19 DIAGNOSIS — R1312 Dysphagia, oropharyngeal phase: Secondary | ICD-10-CM | POA: Diagnosis not present

## 2021-08-19 DIAGNOSIS — M6281 Muscle weakness (generalized): Secondary | ICD-10-CM | POA: Diagnosis not present

## 2021-08-19 DIAGNOSIS — F0390 Unspecified dementia without behavioral disturbance: Secondary | ICD-10-CM | POA: Diagnosis not present

## 2021-08-19 DIAGNOSIS — R4789 Other speech disturbances: Secondary | ICD-10-CM | POA: Diagnosis not present

## 2021-08-21 DIAGNOSIS — R1312 Dysphagia, oropharyngeal phase: Secondary | ICD-10-CM | POA: Diagnosis not present

## 2021-08-21 DIAGNOSIS — M6281 Muscle weakness (generalized): Secondary | ICD-10-CM | POA: Diagnosis not present

## 2021-08-21 DIAGNOSIS — F0390 Unspecified dementia without behavioral disturbance: Secondary | ICD-10-CM | POA: Diagnosis not present

## 2021-08-21 DIAGNOSIS — R4789 Other speech disturbances: Secondary | ICD-10-CM | POA: Diagnosis not present

## 2021-08-24 DIAGNOSIS — R4789 Other speech disturbances: Secondary | ICD-10-CM | POA: Diagnosis not present

## 2021-08-24 DIAGNOSIS — M6281 Muscle weakness (generalized): Secondary | ICD-10-CM | POA: Diagnosis not present

## 2021-08-24 DIAGNOSIS — F0390 Unspecified dementia without behavioral disturbance: Secondary | ICD-10-CM | POA: Diagnosis not present

## 2021-08-24 DIAGNOSIS — R1312 Dysphagia, oropharyngeal phase: Secondary | ICD-10-CM | POA: Diagnosis not present

## 2021-08-25 DIAGNOSIS — M6281 Muscle weakness (generalized): Secondary | ICD-10-CM | POA: Diagnosis not present

## 2021-08-25 DIAGNOSIS — R1312 Dysphagia, oropharyngeal phase: Secondary | ICD-10-CM | POA: Diagnosis not present

## 2021-08-25 DIAGNOSIS — F0390 Unspecified dementia without behavioral disturbance: Secondary | ICD-10-CM | POA: Diagnosis not present

## 2021-08-25 DIAGNOSIS — R4789 Other speech disturbances: Secondary | ICD-10-CM | POA: Diagnosis not present

## 2021-08-27 DIAGNOSIS — F0390 Unspecified dementia without behavioral disturbance: Secondary | ICD-10-CM | POA: Diagnosis not present

## 2021-08-27 DIAGNOSIS — R1312 Dysphagia, oropharyngeal phase: Secondary | ICD-10-CM | POA: Diagnosis not present

## 2021-08-27 DIAGNOSIS — R4789 Other speech disturbances: Secondary | ICD-10-CM | POA: Diagnosis not present

## 2021-08-27 DIAGNOSIS — M6281 Muscle weakness (generalized): Secondary | ICD-10-CM | POA: Diagnosis not present

## 2021-08-31 DIAGNOSIS — R1312 Dysphagia, oropharyngeal phase: Secondary | ICD-10-CM | POA: Diagnosis not present

## 2021-08-31 DIAGNOSIS — F0390 Unspecified dementia without behavioral disturbance: Secondary | ICD-10-CM | POA: Diagnosis not present

## 2021-08-31 DIAGNOSIS — R4789 Other speech disturbances: Secondary | ICD-10-CM | POA: Diagnosis not present

## 2021-08-31 DIAGNOSIS — M6281 Muscle weakness (generalized): Secondary | ICD-10-CM | POA: Diagnosis not present

## 2021-09-01 DIAGNOSIS — M6281 Muscle weakness (generalized): Secondary | ICD-10-CM | POA: Diagnosis not present

## 2021-09-01 DIAGNOSIS — F0390 Unspecified dementia without behavioral disturbance: Secondary | ICD-10-CM | POA: Diagnosis not present

## 2021-09-01 DIAGNOSIS — R1312 Dysphagia, oropharyngeal phase: Secondary | ICD-10-CM | POA: Diagnosis not present

## 2021-09-01 DIAGNOSIS — R4789 Other speech disturbances: Secondary | ICD-10-CM | POA: Diagnosis not present

## 2021-09-03 DIAGNOSIS — F0390 Unspecified dementia without behavioral disturbance: Secondary | ICD-10-CM | POA: Diagnosis not present

## 2021-09-03 DIAGNOSIS — R1312 Dysphagia, oropharyngeal phase: Secondary | ICD-10-CM | POA: Diagnosis not present

## 2021-09-03 DIAGNOSIS — R4789 Other speech disturbances: Secondary | ICD-10-CM | POA: Diagnosis not present

## 2021-09-16 DIAGNOSIS — F0390 Unspecified dementia without behavioral disturbance: Secondary | ICD-10-CM | POA: Diagnosis not present

## 2021-09-16 DIAGNOSIS — E039 Hypothyroidism, unspecified: Secondary | ICD-10-CM | POA: Diagnosis not present

## 2021-09-16 DIAGNOSIS — Z6827 Body mass index (BMI) 27.0-27.9, adult: Secondary | ICD-10-CM | POA: Diagnosis not present

## 2021-09-28 DIAGNOSIS — F0390 Unspecified dementia without behavioral disturbance: Secondary | ICD-10-CM | POA: Diagnosis not present

## 2021-09-28 DIAGNOSIS — R55 Syncope and collapse: Secondary | ICD-10-CM | POA: Diagnosis not present

## 2021-09-28 DIAGNOSIS — R051 Acute cough: Secondary | ICD-10-CM | POA: Diagnosis not present

## 2021-09-28 DIAGNOSIS — R0989 Other specified symptoms and signs involving the circulatory and respiratory systems: Secondary | ICD-10-CM | POA: Diagnosis not present

## 2021-09-29 DIAGNOSIS — F0390 Unspecified dementia without behavioral disturbance: Secondary | ICD-10-CM | POA: Diagnosis not present

## 2021-09-29 DIAGNOSIS — Z7689 Persons encountering health services in other specified circumstances: Secondary | ICD-10-CM | POA: Diagnosis not present

## 2021-09-29 DIAGNOSIS — H04123 Dry eye syndrome of bilateral lacrimal glands: Secondary | ICD-10-CM | POA: Diagnosis not present

## 2021-10-01 DIAGNOSIS — R0689 Other abnormalities of breathing: Secondary | ICD-10-CM | POA: Diagnosis not present

## 2021-10-01 DIAGNOSIS — Z7689 Persons encountering health services in other specified circumstances: Secondary | ICD-10-CM | POA: Diagnosis not present

## 2021-10-01 DIAGNOSIS — R051 Acute cough: Secondary | ICD-10-CM | POA: Diagnosis not present

## 2021-10-01 DIAGNOSIS — N182 Chronic kidney disease, stage 2 (mild): Secondary | ICD-10-CM | POA: Diagnosis not present

## 2021-10-01 DIAGNOSIS — F0390 Unspecified dementia without behavioral disturbance: Secondary | ICD-10-CM | POA: Diagnosis not present

## 2021-10-02 DIAGNOSIS — R051 Acute cough: Secondary | ICD-10-CM | POA: Diagnosis not present

## 2021-10-02 DIAGNOSIS — R1110 Vomiting, unspecified: Secondary | ICD-10-CM | POA: Diagnosis not present

## 2021-10-02 DIAGNOSIS — R093 Abnormal sputum: Secondary | ICD-10-CM | POA: Diagnosis not present

## 2021-10-02 DIAGNOSIS — F0390 Unspecified dementia without behavioral disturbance: Secondary | ICD-10-CM | POA: Diagnosis not present

## 2021-10-05 DIAGNOSIS — F0390 Unspecified dementia without behavioral disturbance: Secondary | ICD-10-CM | POA: Diagnosis not present

## 2021-10-05 DIAGNOSIS — B3731 Acute candidiasis of vulva and vagina: Secondary | ICD-10-CM | POA: Diagnosis not present

## 2021-10-06 ENCOUNTER — Ambulatory Visit (INDEPENDENT_AMBULATORY_CARE_PROVIDER_SITE_OTHER): Payer: Medicare Other

## 2021-10-06 DIAGNOSIS — I495 Sick sinus syndrome: Secondary | ICD-10-CM | POA: Diagnosis not present

## 2021-10-06 LAB — CUP PACEART REMOTE DEVICE CHECK
Date Time Interrogation Session: 20230404085902
Implantable Lead Implant Date: 20190923
Implantable Lead Implant Date: 20190923
Implantable Lead Location: 753859
Implantable Lead Location: 753860
Implantable Lead Model: 377
Implantable Lead Model: 377
Implantable Lead Serial Number: 80773242
Implantable Lead Serial Number: 80830530
Implantable Pulse Generator Implant Date: 20190923
Pulse Gen Model: 407145
Pulse Gen Serial Number: 69439851

## 2021-10-20 DIAGNOSIS — R3981 Functional urinary incontinence: Secondary | ICD-10-CM | POA: Diagnosis not present

## 2021-10-20 DIAGNOSIS — F0390 Unspecified dementia without behavioral disturbance: Secondary | ICD-10-CM | POA: Diagnosis not present

## 2021-10-20 DIAGNOSIS — B3731 Acute candidiasis of vulva and vagina: Secondary | ICD-10-CM | POA: Diagnosis not present

## 2021-10-20 NOTE — Progress Notes (Signed)
Remote pacemaker transmission.   

## 2021-10-23 DIAGNOSIS — F0390 Unspecified dementia without behavioral disturbance: Secondary | ICD-10-CM | POA: Diagnosis not present

## 2021-10-23 DIAGNOSIS — Z7689 Persons encountering health services in other specified circumstances: Secondary | ICD-10-CM | POA: Diagnosis not present

## 2021-10-26 DIAGNOSIS — B3731 Acute candidiasis of vulva and vagina: Secondary | ICD-10-CM | POA: Diagnosis not present

## 2021-10-26 DIAGNOSIS — Z7689 Persons encountering health services in other specified circumstances: Secondary | ICD-10-CM | POA: Diagnosis not present

## 2021-10-26 DIAGNOSIS — F0390 Unspecified dementia without behavioral disturbance: Secondary | ICD-10-CM | POA: Diagnosis not present

## 2021-10-29 DIAGNOSIS — E039 Hypothyroidism, unspecified: Secondary | ICD-10-CM | POA: Diagnosis not present

## 2021-10-29 DIAGNOSIS — Z7689 Persons encountering health services in other specified circumstances: Secondary | ICD-10-CM | POA: Diagnosis not present

## 2021-10-29 DIAGNOSIS — F0390 Unspecified dementia without behavioral disturbance: Secondary | ICD-10-CM | POA: Diagnosis not present

## 2022-01-12 DIAGNOSIS — F0390 Unspecified dementia without behavioral disturbance: Secondary | ICD-10-CM | POA: Diagnosis not present

## 2022-01-12 DIAGNOSIS — E039 Hypothyroidism, unspecified: Secondary | ICD-10-CM | POA: Diagnosis not present

## 2022-01-14 DIAGNOSIS — N182 Chronic kidney disease, stage 2 (mild): Secondary | ICD-10-CM | POA: Diagnosis not present

## 2022-01-14 DIAGNOSIS — F0390 Unspecified dementia without behavioral disturbance: Secondary | ICD-10-CM | POA: Diagnosis not present

## 2022-01-14 DIAGNOSIS — E559 Vitamin D deficiency, unspecified: Secondary | ICD-10-CM | POA: Diagnosis not present

## 2022-01-26 DIAGNOSIS — F0390 Unspecified dementia without behavioral disturbance: Secondary | ICD-10-CM | POA: Diagnosis not present

## 2022-01-27 DIAGNOSIS — F0390 Unspecified dementia without behavioral disturbance: Secondary | ICD-10-CM | POA: Diagnosis not present

## 2022-01-27 DIAGNOSIS — R1312 Dysphagia, oropharyngeal phase: Secondary | ICD-10-CM | POA: Diagnosis not present

## 2022-01-28 DIAGNOSIS — R1312 Dysphagia, oropharyngeal phase: Secondary | ICD-10-CM | POA: Diagnosis not present

## 2022-01-28 DIAGNOSIS — F0390 Unspecified dementia without behavioral disturbance: Secondary | ICD-10-CM | POA: Diagnosis not present

## 2022-02-01 ENCOUNTER — Telehealth: Payer: Self-pay

## 2022-02-01 DIAGNOSIS — R112 Nausea with vomiting, unspecified: Secondary | ICD-10-CM | POA: Diagnosis not present

## 2022-02-01 DIAGNOSIS — R1312 Dysphagia, oropharyngeal phase: Secondary | ICD-10-CM | POA: Diagnosis not present

## 2022-02-01 DIAGNOSIS — F0390 Unspecified dementia without behavioral disturbance: Secondary | ICD-10-CM | POA: Diagnosis not present

## 2022-02-01 NOTE — Telephone Encounter (Signed)
Unsuccessful telephone encounter to patient at both given numbers to follow up on lack of remote monitoring. Will attempt to contact at later time.

## 2022-02-03 NOTE — Telephone Encounter (Signed)
Patient resides at [facility], contacted [facility] and left VM to call device clinic back, also LVM on patient's son's cell phone.

## 2022-02-04 DIAGNOSIS — R278 Other lack of coordination: Secondary | ICD-10-CM | POA: Diagnosis not present

## 2022-02-04 DIAGNOSIS — M6281 Muscle weakness (generalized): Secondary | ICD-10-CM | POA: Diagnosis not present

## 2022-02-04 DIAGNOSIS — R1312 Dysphagia, oropharyngeal phase: Secondary | ICD-10-CM | POA: Diagnosis not present

## 2022-02-05 DIAGNOSIS — M6281 Muscle weakness (generalized): Secondary | ICD-10-CM | POA: Diagnosis not present

## 2022-02-05 DIAGNOSIS — R278 Other lack of coordination: Secondary | ICD-10-CM | POA: Diagnosis not present

## 2022-02-05 DIAGNOSIS — R1312 Dysphagia, oropharyngeal phase: Secondary | ICD-10-CM | POA: Diagnosis not present

## 2022-02-08 DIAGNOSIS — M6281 Muscle weakness (generalized): Secondary | ICD-10-CM | POA: Diagnosis not present

## 2022-02-08 DIAGNOSIS — R1312 Dysphagia, oropharyngeal phase: Secondary | ICD-10-CM | POA: Diagnosis not present

## 2022-02-08 DIAGNOSIS — R278 Other lack of coordination: Secondary | ICD-10-CM | POA: Diagnosis not present

## 2022-02-09 DIAGNOSIS — M6281 Muscle weakness (generalized): Secondary | ICD-10-CM | POA: Diagnosis not present

## 2022-02-09 DIAGNOSIS — R278 Other lack of coordination: Secondary | ICD-10-CM | POA: Diagnosis not present

## 2022-02-09 DIAGNOSIS — R1312 Dysphagia, oropharyngeal phase: Secondary | ICD-10-CM | POA: Diagnosis not present

## 2022-02-10 DIAGNOSIS — R278 Other lack of coordination: Secondary | ICD-10-CM | POA: Diagnosis not present

## 2022-02-10 DIAGNOSIS — R1312 Dysphagia, oropharyngeal phase: Secondary | ICD-10-CM | POA: Diagnosis not present

## 2022-02-10 DIAGNOSIS — M6281 Muscle weakness (generalized): Secondary | ICD-10-CM | POA: Diagnosis not present

## 2022-02-10 NOTE — Telephone Encounter (Signed)
Will send certified letter. Unable to contact family by phone. Mychart is also inactive.

## 2022-02-11 DIAGNOSIS — M6281 Muscle weakness (generalized): Secondary | ICD-10-CM | POA: Diagnosis not present

## 2022-02-11 DIAGNOSIS — R278 Other lack of coordination: Secondary | ICD-10-CM | POA: Diagnosis not present

## 2022-02-11 DIAGNOSIS — R1312 Dysphagia, oropharyngeal phase: Secondary | ICD-10-CM | POA: Diagnosis not present

## 2022-02-15 DIAGNOSIS — M6281 Muscle weakness (generalized): Secondary | ICD-10-CM | POA: Diagnosis not present

## 2022-02-15 DIAGNOSIS — R1312 Dysphagia, oropharyngeal phase: Secondary | ICD-10-CM | POA: Diagnosis not present

## 2022-02-15 DIAGNOSIS — R278 Other lack of coordination: Secondary | ICD-10-CM | POA: Diagnosis not present

## 2022-02-17 DIAGNOSIS — I739 Peripheral vascular disease, unspecified: Secondary | ICD-10-CM | POA: Diagnosis not present

## 2022-02-17 DIAGNOSIS — R1312 Dysphagia, oropharyngeal phase: Secondary | ICD-10-CM | POA: Diagnosis not present

## 2022-02-17 DIAGNOSIS — R278 Other lack of coordination: Secondary | ICD-10-CM | POA: Diagnosis not present

## 2022-02-17 DIAGNOSIS — M6281 Muscle weakness (generalized): Secondary | ICD-10-CM | POA: Diagnosis not present

## 2022-02-17 DIAGNOSIS — B351 Tinea unguium: Secondary | ICD-10-CM | POA: Diagnosis not present

## 2022-02-19 DIAGNOSIS — M6281 Muscle weakness (generalized): Secondary | ICD-10-CM | POA: Diagnosis not present

## 2022-02-19 DIAGNOSIS — R278 Other lack of coordination: Secondary | ICD-10-CM | POA: Diagnosis not present

## 2022-02-19 DIAGNOSIS — R1312 Dysphagia, oropharyngeal phase: Secondary | ICD-10-CM | POA: Diagnosis not present

## 2022-02-21 DIAGNOSIS — M6281 Muscle weakness (generalized): Secondary | ICD-10-CM | POA: Diagnosis not present

## 2022-02-21 DIAGNOSIS — R278 Other lack of coordination: Secondary | ICD-10-CM | POA: Diagnosis not present

## 2022-02-21 DIAGNOSIS — R1312 Dysphagia, oropharyngeal phase: Secondary | ICD-10-CM | POA: Diagnosis not present

## 2022-02-25 DIAGNOSIS — R278 Other lack of coordination: Secondary | ICD-10-CM | POA: Diagnosis not present

## 2022-02-25 DIAGNOSIS — R1312 Dysphagia, oropharyngeal phase: Secondary | ICD-10-CM | POA: Diagnosis not present

## 2022-02-25 DIAGNOSIS — M6281 Muscle weakness (generalized): Secondary | ICD-10-CM | POA: Diagnosis not present

## 2022-02-26 DIAGNOSIS — R278 Other lack of coordination: Secondary | ICD-10-CM | POA: Diagnosis not present

## 2022-02-26 DIAGNOSIS — R1312 Dysphagia, oropharyngeal phase: Secondary | ICD-10-CM | POA: Diagnosis not present

## 2022-02-26 DIAGNOSIS — M6281 Muscle weakness (generalized): Secondary | ICD-10-CM | POA: Diagnosis not present

## 2022-02-26 DIAGNOSIS — F0390 Unspecified dementia without behavioral disturbance: Secondary | ICD-10-CM | POA: Diagnosis not present

## 2022-02-26 DIAGNOSIS — K5900 Constipation, unspecified: Secondary | ICD-10-CM | POA: Diagnosis not present

## 2022-02-26 DIAGNOSIS — R112 Nausea with vomiting, unspecified: Secondary | ICD-10-CM | POA: Diagnosis not present

## 2022-03-01 DIAGNOSIS — M6281 Muscle weakness (generalized): Secondary | ICD-10-CM | POA: Diagnosis not present

## 2022-03-01 DIAGNOSIS — R278 Other lack of coordination: Secondary | ICD-10-CM | POA: Diagnosis not present

## 2022-03-01 DIAGNOSIS — R1312 Dysphagia, oropharyngeal phase: Secondary | ICD-10-CM | POA: Diagnosis not present

## 2022-03-03 DIAGNOSIS — R278 Other lack of coordination: Secondary | ICD-10-CM | POA: Diagnosis not present

## 2022-03-03 DIAGNOSIS — M6281 Muscle weakness (generalized): Secondary | ICD-10-CM | POA: Diagnosis not present

## 2022-03-03 DIAGNOSIS — R1312 Dysphagia, oropharyngeal phase: Secondary | ICD-10-CM | POA: Diagnosis not present

## 2022-03-04 DIAGNOSIS — R278 Other lack of coordination: Secondary | ICD-10-CM | POA: Diagnosis not present

## 2022-03-04 DIAGNOSIS — M6281 Muscle weakness (generalized): Secondary | ICD-10-CM | POA: Diagnosis not present

## 2022-03-04 DIAGNOSIS — R1312 Dysphagia, oropharyngeal phase: Secondary | ICD-10-CM | POA: Diagnosis not present

## 2022-03-07 DIAGNOSIS — F0390 Unspecified dementia without behavioral disturbance: Secondary | ICD-10-CM | POA: Diagnosis not present

## 2022-03-07 DIAGNOSIS — R1312 Dysphagia, oropharyngeal phase: Secondary | ICD-10-CM | POA: Diagnosis not present

## 2022-03-09 ENCOUNTER — Telehealth: Payer: Self-pay | Admitting: *Deleted

## 2022-03-09 NOTE — Patient Outreach (Signed)
  Care Coordination   03/09/2022 Name: Laurie Klein MRN: [id number] DOB: Feb 12, 1931   Care Coordination Outreach Attempts:  An unsuccessful telephone outreach was attempted today to offer the patient information about available care coordination services as a benefit of their health plan.   Follow Up Plan:  No further outreach attempts will be made at this time. We have been unable to contact the patient to offer or enroll patient in care coordination services Pt resides at [facility].  Encounter Outcome:  No Answer  Care Coordination Interventions Activated:  No   Care Coordination Interventions:  No, not indicated    Eduard Clos MSW, LCSW Licensed Clinical Social Worker      [phone]

## 2022-03-10 DIAGNOSIS — F0390 Unspecified dementia without behavioral disturbance: Secondary | ICD-10-CM | POA: Diagnosis not present

## 2022-03-10 DIAGNOSIS — R1312 Dysphagia, oropharyngeal phase: Secondary | ICD-10-CM | POA: Diagnosis not present

## 2022-03-12 DIAGNOSIS — R1312 Dysphagia, oropharyngeal phase: Secondary | ICD-10-CM | POA: Diagnosis not present

## 2022-03-12 DIAGNOSIS — F0390 Unspecified dementia without behavioral disturbance: Secondary | ICD-10-CM | POA: Diagnosis not present

## 2022-03-15 DIAGNOSIS — F0390 Unspecified dementia without behavioral disturbance: Secondary | ICD-10-CM | POA: Diagnosis not present

## 2022-03-15 DIAGNOSIS — R1312 Dysphagia, oropharyngeal phase: Secondary | ICD-10-CM | POA: Diagnosis not present

## 2022-03-17 DIAGNOSIS — F0390 Unspecified dementia without behavioral disturbance: Secondary | ICD-10-CM | POA: Diagnosis not present

## 2022-03-17 DIAGNOSIS — R1312 Dysphagia, oropharyngeal phase: Secondary | ICD-10-CM | POA: Diagnosis not present

## 2022-03-18 DIAGNOSIS — R410 Disorientation, unspecified: Secondary | ICD-10-CM | POA: Diagnosis not present

## 2022-03-18 DIAGNOSIS — M6281 Muscle weakness (generalized): Secondary | ICD-10-CM | POA: Diagnosis not present

## 2022-03-18 DIAGNOSIS — S5012XA Contusion of left forearm, initial encounter: Secondary | ICD-10-CM | POA: Diagnosis not present

## 2022-03-18 DIAGNOSIS — R2689 Other abnormalities of gait and mobility: Secondary | ICD-10-CM | POA: Diagnosis not present

## 2022-03-19 DIAGNOSIS — F0390 Unspecified dementia without behavioral disturbance: Secondary | ICD-10-CM | POA: Diagnosis not present

## 2022-03-19 DIAGNOSIS — R1312 Dysphagia, oropharyngeal phase: Secondary | ICD-10-CM | POA: Diagnosis not present

## 2022-03-22 DIAGNOSIS — R1312 Dysphagia, oropharyngeal phase: Secondary | ICD-10-CM | POA: Diagnosis not present

## 2022-03-22 DIAGNOSIS — F0390 Unspecified dementia without behavioral disturbance: Secondary | ICD-10-CM | POA: Diagnosis not present

## 2022-03-24 DIAGNOSIS — F0390 Unspecified dementia without behavioral disturbance: Secondary | ICD-10-CM | POA: Diagnosis not present

## 2022-03-24 DIAGNOSIS — R1312 Dysphagia, oropharyngeal phase: Secondary | ICD-10-CM | POA: Diagnosis not present

## 2022-03-25 DIAGNOSIS — R52 Pain, unspecified: Secondary | ICD-10-CM | POA: Diagnosis not present

## 2022-03-25 DIAGNOSIS — K5900 Constipation, unspecified: Secondary | ICD-10-CM | POA: Diagnosis not present

## 2022-03-25 DIAGNOSIS — F0390 Unspecified dementia without behavioral disturbance: Secondary | ICD-10-CM | POA: Diagnosis not present

## 2022-03-25 DIAGNOSIS — Z6825 Body mass index (BMI) 25.0-25.9, adult: Secondary | ICD-10-CM | POA: Diagnosis not present

## 2022-03-25 DIAGNOSIS — E039 Hypothyroidism, unspecified: Secondary | ICD-10-CM | POA: Diagnosis not present

## 2022-03-25 DIAGNOSIS — K219 Gastro-esophageal reflux disease without esophagitis: Secondary | ICD-10-CM | POA: Diagnosis not present

## 2022-03-26 DIAGNOSIS — F0390 Unspecified dementia without behavioral disturbance: Secondary | ICD-10-CM | POA: Diagnosis not present

## 2022-03-26 DIAGNOSIS — R1312 Dysphagia, oropharyngeal phase: Secondary | ICD-10-CM | POA: Diagnosis not present

## 2022-04-09 DIAGNOSIS — Z23 Encounter for immunization: Secondary | ICD-10-CM | POA: Diagnosis not present

## 2022-04-20 DIAGNOSIS — B351 Tinea unguium: Secondary | ICD-10-CM | POA: Diagnosis not present

## 2022-04-20 DIAGNOSIS — I739 Peripheral vascular disease, unspecified: Secondary | ICD-10-CM | POA: Diagnosis not present

## 2022-04-28 DIAGNOSIS — Z6825 Body mass index (BMI) 25.0-25.9, adult: Secondary | ICD-10-CM | POA: Diagnosis not present

## 2022-04-28 DIAGNOSIS — K5900 Constipation, unspecified: Secondary | ICD-10-CM | POA: Diagnosis not present

## 2022-04-28 DIAGNOSIS — F0390 Unspecified dementia without behavioral disturbance: Secondary | ICD-10-CM | POA: Diagnosis not present

## 2022-04-28 DIAGNOSIS — R52 Pain, unspecified: Secondary | ICD-10-CM | POA: Diagnosis not present

## 2022-04-28 DIAGNOSIS — K219 Gastro-esophageal reflux disease without esophagitis: Secondary | ICD-10-CM | POA: Diagnosis not present

## 2022-04-28 DIAGNOSIS — E039 Hypothyroidism, unspecified: Secondary | ICD-10-CM | POA: Diagnosis not present

## 2022-05-14 DIAGNOSIS — K219 Gastro-esophageal reflux disease without esophagitis: Secondary | ICD-10-CM | POA: Diagnosis not present

## 2022-05-14 DIAGNOSIS — R112 Nausea with vomiting, unspecified: Secondary | ICD-10-CM | POA: Diagnosis not present

## 2022-05-14 DIAGNOSIS — R634 Abnormal weight loss: Secondary | ICD-10-CM | POA: Diagnosis not present

## 2022-05-14 DIAGNOSIS — F0390 Unspecified dementia without behavioral disturbance: Secondary | ICD-10-CM | POA: Diagnosis not present

## 2022-05-17 DIAGNOSIS — Z20822 Contact with and (suspected) exposure to covid-19: Secondary | ICD-10-CM | POA: Diagnosis not present

## 2022-05-17 DIAGNOSIS — F0390 Unspecified dementia without behavioral disturbance: Secondary | ICD-10-CM | POA: Diagnosis not present

## 2022-05-19 DIAGNOSIS — G311 Senile degeneration of brain, not elsewhere classified: Secondary | ICD-10-CM | POA: Diagnosis not present

## 2022-05-19 DIAGNOSIS — R159 Full incontinence of feces: Secondary | ICD-10-CM | POA: Diagnosis not present

## 2022-05-19 DIAGNOSIS — R32 Unspecified urinary incontinence: Secondary | ICD-10-CM | POA: Diagnosis not present

## 2022-05-19 DIAGNOSIS — R627 Adult failure to thrive: Secondary | ICD-10-CM | POA: Diagnosis not present

## 2022-05-19 DIAGNOSIS — Z741 Need for assistance with personal care: Secondary | ICD-10-CM | POA: Diagnosis not present

## 2022-05-19 DIAGNOSIS — Z7401 Bed confinement status: Secondary | ICD-10-CM | POA: Diagnosis not present

## 2022-05-19 DIAGNOSIS — Z515 Encounter for palliative care: Secondary | ICD-10-CM | POA: Diagnosis not present

## 2022-05-19 DIAGNOSIS — Z20828 Contact with and (suspected) exposure to other viral communicable diseases: Secondary | ICD-10-CM | POA: Diagnosis not present

## 2022-05-19 DIAGNOSIS — E43 Unspecified severe protein-calorie malnutrition: Secondary | ICD-10-CM | POA: Diagnosis not present

## 2022-05-19 DIAGNOSIS — R1312 Dysphagia, oropharyngeal phase: Secondary | ICD-10-CM | POA: Diagnosis not present

## 2022-05-19 DIAGNOSIS — Z66 Do not resuscitate: Secondary | ICD-10-CM | POA: Diagnosis not present

## 2022-05-19 DIAGNOSIS — F0280 Dementia in other diseases classified elsewhere without behavioral disturbance: Secondary | ICD-10-CM | POA: Diagnosis not present

## 2022-05-19 DIAGNOSIS — F0390 Unspecified dementia without behavioral disturbance: Secondary | ICD-10-CM | POA: Diagnosis not present

## 2022-05-19 DIAGNOSIS — E860 Dehydration: Secondary | ICD-10-CM | POA: Diagnosis not present

## 2022-05-20 DIAGNOSIS — J309 Allergic rhinitis, unspecified: Secondary | ICD-10-CM | POA: Diagnosis not present

## 2022-05-20 DIAGNOSIS — G311 Senile degeneration of brain, not elsewhere classified: Secondary | ICD-10-CM | POA: Diagnosis not present

## 2022-05-20 DIAGNOSIS — E43 Unspecified severe protein-calorie malnutrition: Secondary | ICD-10-CM | POA: Diagnosis not present

## 2022-05-20 DIAGNOSIS — D72829 Elevated white blood cell count, unspecified: Secondary | ICD-10-CM | POA: Diagnosis not present

## 2022-05-20 DIAGNOSIS — R627 Adult failure to thrive: Secondary | ICD-10-CM | POA: Diagnosis not present

## 2022-05-20 DIAGNOSIS — R159 Full incontinence of feces: Secondary | ICD-10-CM | POA: Diagnosis not present

## 2022-05-20 DIAGNOSIS — F0280 Dementia in other diseases classified elsewhere without behavioral disturbance: Secondary | ICD-10-CM | POA: Diagnosis not present

## 2022-05-20 DIAGNOSIS — R0989 Other specified symptoms and signs involving the circulatory and respiratory systems: Secondary | ICD-10-CM | POA: Diagnosis not present

## 2022-05-20 DIAGNOSIS — R001 Bradycardia, unspecified: Secondary | ICD-10-CM | POA: Diagnosis not present

## 2022-05-20 DIAGNOSIS — F0390 Unspecified dementia without behavioral disturbance: Secondary | ICD-10-CM | POA: Diagnosis not present

## 2022-05-20 DIAGNOSIS — R051 Acute cough: Secondary | ICD-10-CM | POA: Diagnosis not present

## 2022-05-20 DIAGNOSIS — R1312 Dysphagia, oropharyngeal phase: Secondary | ICD-10-CM | POA: Diagnosis not present

## 2022-05-21 DIAGNOSIS — F0390 Unspecified dementia without behavioral disturbance: Secondary | ICD-10-CM | POA: Diagnosis not present

## 2022-05-21 DIAGNOSIS — N182 Chronic kidney disease, stage 2 (mild): Secondary | ICD-10-CM | POA: Diagnosis not present

## 2022-05-21 DIAGNOSIS — Z7689 Persons encountering health services in other specified circumstances: Secondary | ICD-10-CM | POA: Diagnosis not present

## 2022-05-24 DIAGNOSIS — E43 Unspecified severe protein-calorie malnutrition: Secondary | ICD-10-CM | POA: Diagnosis not present

## 2022-05-24 DIAGNOSIS — R627 Adult failure to thrive: Secondary | ICD-10-CM | POA: Diagnosis not present

## 2022-05-24 DIAGNOSIS — F0280 Dementia in other diseases classified elsewhere without behavioral disturbance: Secondary | ICD-10-CM | POA: Diagnosis not present

## 2022-05-24 DIAGNOSIS — R1312 Dysphagia, oropharyngeal phase: Secondary | ICD-10-CM | POA: Diagnosis not present

## 2022-05-24 DIAGNOSIS — G311 Senile degeneration of brain, not elsewhere classified: Secondary | ICD-10-CM | POA: Diagnosis not present

## 2022-05-24 DIAGNOSIS — R159 Full incontinence of feces: Secondary | ICD-10-CM | POA: Diagnosis not present

## 2022-05-26 DIAGNOSIS — F0280 Dementia in other diseases classified elsewhere without behavioral disturbance: Secondary | ICD-10-CM | POA: Diagnosis not present

## 2022-05-26 DIAGNOSIS — R627 Adult failure to thrive: Secondary | ICD-10-CM | POA: Diagnosis not present

## 2022-05-26 DIAGNOSIS — R1312 Dysphagia, oropharyngeal phase: Secondary | ICD-10-CM | POA: Diagnosis not present

## 2022-05-26 DIAGNOSIS — R159 Full incontinence of feces: Secondary | ICD-10-CM | POA: Diagnosis not present

## 2022-05-26 DIAGNOSIS — G311 Senile degeneration of brain, not elsewhere classified: Secondary | ICD-10-CM | POA: Diagnosis not present

## 2022-05-26 DIAGNOSIS — E43 Unspecified severe protein-calorie malnutrition: Secondary | ICD-10-CM | POA: Diagnosis not present

## 2022-05-31 DIAGNOSIS — G311 Senile degeneration of brain, not elsewhere classified: Secondary | ICD-10-CM | POA: Diagnosis not present

## 2022-05-31 DIAGNOSIS — R1312 Dysphagia, oropharyngeal phase: Secondary | ICD-10-CM | POA: Diagnosis not present

## 2022-05-31 DIAGNOSIS — F0280 Dementia in other diseases classified elsewhere without behavioral disturbance: Secondary | ICD-10-CM | POA: Diagnosis not present

## 2022-05-31 DIAGNOSIS — R627 Adult failure to thrive: Secondary | ICD-10-CM | POA: Diagnosis not present

## 2022-05-31 DIAGNOSIS — E43 Unspecified severe protein-calorie malnutrition: Secondary | ICD-10-CM | POA: Diagnosis not present

## 2022-05-31 DIAGNOSIS — R159 Full incontinence of feces: Secondary | ICD-10-CM | POA: Diagnosis not present

## 2022-06-02 DIAGNOSIS — E43 Unspecified severe protein-calorie malnutrition: Secondary | ICD-10-CM | POA: Diagnosis not present

## 2022-06-02 DIAGNOSIS — R627 Adult failure to thrive: Secondary | ICD-10-CM | POA: Diagnosis not present

## 2022-06-02 DIAGNOSIS — F0280 Dementia in other diseases classified elsewhere without behavioral disturbance: Secondary | ICD-10-CM | POA: Diagnosis not present

## 2022-06-02 DIAGNOSIS — R1312 Dysphagia, oropharyngeal phase: Secondary | ICD-10-CM | POA: Diagnosis not present

## 2022-06-02 DIAGNOSIS — R159 Full incontinence of feces: Secondary | ICD-10-CM | POA: Diagnosis not present

## 2022-06-02 DIAGNOSIS — G311 Senile degeneration of brain, not elsewhere classified: Secondary | ICD-10-CM | POA: Diagnosis not present

## 2022-06-03 DIAGNOSIS — R112 Nausea with vomiting, unspecified: Secondary | ICD-10-CM | POA: Diagnosis not present

## 2022-06-03 DIAGNOSIS — K219 Gastro-esophageal reflux disease without esophagitis: Secondary | ICD-10-CM | POA: Diagnosis not present

## 2022-06-04 DIAGNOSIS — F0280 Dementia in other diseases classified elsewhere without behavioral disturbance: Secondary | ICD-10-CM | POA: Diagnosis not present

## 2022-06-04 DIAGNOSIS — Z741 Need for assistance with personal care: Secondary | ICD-10-CM | POA: Diagnosis not present

## 2022-06-04 DIAGNOSIS — Z66 Do not resuscitate: Secondary | ICD-10-CM | POA: Diagnosis not present

## 2022-06-04 DIAGNOSIS — Z7401 Bed confinement status: Secondary | ICD-10-CM | POA: Diagnosis not present

## 2022-06-04 DIAGNOSIS — Z20828 Contact with and (suspected) exposure to other viral communicable diseases: Secondary | ICD-10-CM | POA: Diagnosis not present

## 2022-06-04 DIAGNOSIS — Z515 Encounter for palliative care: Secondary | ICD-10-CM | POA: Diagnosis not present

## 2022-06-04 DIAGNOSIS — E43 Unspecified severe protein-calorie malnutrition: Secondary | ICD-10-CM | POA: Diagnosis not present

## 2022-06-04 DIAGNOSIS — R627 Adult failure to thrive: Secondary | ICD-10-CM | POA: Diagnosis not present

## 2022-06-04 DIAGNOSIS — R32 Unspecified urinary incontinence: Secondary | ICD-10-CM | POA: Diagnosis not present

## 2022-06-04 DIAGNOSIS — R1312 Dysphagia, oropharyngeal phase: Secondary | ICD-10-CM | POA: Diagnosis not present

## 2022-06-04 DIAGNOSIS — R159 Full incontinence of feces: Secondary | ICD-10-CM | POA: Diagnosis not present

## 2022-06-04 DIAGNOSIS — G311 Senile degeneration of brain, not elsewhere classified: Secondary | ICD-10-CM | POA: Diagnosis not present

## 2022-06-07 DIAGNOSIS — R1312 Dysphagia, oropharyngeal phase: Secondary | ICD-10-CM | POA: Diagnosis not present

## 2022-06-07 DIAGNOSIS — E43 Unspecified severe protein-calorie malnutrition: Secondary | ICD-10-CM | POA: Diagnosis not present

## 2022-06-07 DIAGNOSIS — R159 Full incontinence of feces: Secondary | ICD-10-CM | POA: Diagnosis not present

## 2022-06-07 DIAGNOSIS — G311 Senile degeneration of brain, not elsewhere classified: Secondary | ICD-10-CM | POA: Diagnosis not present

## 2022-06-07 DIAGNOSIS — F0280 Dementia in other diseases classified elsewhere without behavioral disturbance: Secondary | ICD-10-CM | POA: Diagnosis not present

## 2022-06-07 DIAGNOSIS — R627 Adult failure to thrive: Secondary | ICD-10-CM | POA: Diagnosis not present

## 2022-06-09 DIAGNOSIS — R159 Full incontinence of feces: Secondary | ICD-10-CM | POA: Diagnosis not present

## 2022-06-09 DIAGNOSIS — G311 Senile degeneration of brain, not elsewhere classified: Secondary | ICD-10-CM | POA: Diagnosis not present

## 2022-06-09 DIAGNOSIS — R627 Adult failure to thrive: Secondary | ICD-10-CM | POA: Diagnosis not present

## 2022-06-09 DIAGNOSIS — E43 Unspecified severe protein-calorie malnutrition: Secondary | ICD-10-CM | POA: Diagnosis not present

## 2022-06-09 DIAGNOSIS — R1312 Dysphagia, oropharyngeal phase: Secondary | ICD-10-CM | POA: Diagnosis not present

## 2022-06-09 DIAGNOSIS — F0280 Dementia in other diseases classified elsewhere without behavioral disturbance: Secondary | ICD-10-CM | POA: Diagnosis not present

## 2022-06-10 DIAGNOSIS — R627 Adult failure to thrive: Secondary | ICD-10-CM | POA: Diagnosis not present

## 2022-06-10 DIAGNOSIS — R159 Full incontinence of feces: Secondary | ICD-10-CM | POA: Diagnosis not present

## 2022-06-10 DIAGNOSIS — R1312 Dysphagia, oropharyngeal phase: Secondary | ICD-10-CM | POA: Diagnosis not present

## 2022-06-10 DIAGNOSIS — G311 Senile degeneration of brain, not elsewhere classified: Secondary | ICD-10-CM | POA: Diagnosis not present

## 2022-06-10 DIAGNOSIS — E43 Unspecified severe protein-calorie malnutrition: Secondary | ICD-10-CM | POA: Diagnosis not present

## 2022-06-10 DIAGNOSIS — F0280 Dementia in other diseases classified elsewhere without behavioral disturbance: Secondary | ICD-10-CM | POA: Diagnosis not present

## 2022-06-14 DIAGNOSIS — R159 Full incontinence of feces: Secondary | ICD-10-CM | POA: Diagnosis not present

## 2022-06-14 DIAGNOSIS — R1312 Dysphagia, oropharyngeal phase: Secondary | ICD-10-CM | POA: Diagnosis not present

## 2022-06-14 DIAGNOSIS — F0280 Dementia in other diseases classified elsewhere without behavioral disturbance: Secondary | ICD-10-CM | POA: Diagnosis not present

## 2022-06-14 DIAGNOSIS — G311 Senile degeneration of brain, not elsewhere classified: Secondary | ICD-10-CM | POA: Diagnosis not present

## 2022-06-14 DIAGNOSIS — R627 Adult failure to thrive: Secondary | ICD-10-CM | POA: Diagnosis not present

## 2022-06-14 DIAGNOSIS — E43 Unspecified severe protein-calorie malnutrition: Secondary | ICD-10-CM | POA: Diagnosis not present

## 2022-06-16 DIAGNOSIS — R1312 Dysphagia, oropharyngeal phase: Secondary | ICD-10-CM | POA: Diagnosis not present

## 2022-06-16 DIAGNOSIS — G311 Senile degeneration of brain, not elsewhere classified: Secondary | ICD-10-CM | POA: Diagnosis not present

## 2022-06-16 DIAGNOSIS — E43 Unspecified severe protein-calorie malnutrition: Secondary | ICD-10-CM | POA: Diagnosis not present

## 2022-06-16 DIAGNOSIS — R159 Full incontinence of feces: Secondary | ICD-10-CM | POA: Diagnosis not present

## 2022-06-16 DIAGNOSIS — F0280 Dementia in other diseases classified elsewhere without behavioral disturbance: Secondary | ICD-10-CM | POA: Diagnosis not present

## 2022-06-16 DIAGNOSIS — R627 Adult failure to thrive: Secondary | ICD-10-CM | POA: Diagnosis not present

## 2022-06-18 DIAGNOSIS — E43 Unspecified severe protein-calorie malnutrition: Secondary | ICD-10-CM | POA: Diagnosis not present

## 2022-06-18 DIAGNOSIS — R1312 Dysphagia, oropharyngeal phase: Secondary | ICD-10-CM | POA: Diagnosis not present

## 2022-06-18 DIAGNOSIS — R159 Full incontinence of feces: Secondary | ICD-10-CM | POA: Diagnosis not present

## 2022-06-18 DIAGNOSIS — F0280 Dementia in other diseases classified elsewhere without behavioral disturbance: Secondary | ICD-10-CM | POA: Diagnosis not present

## 2022-06-18 DIAGNOSIS — R627 Adult failure to thrive: Secondary | ICD-10-CM | POA: Diagnosis not present

## 2022-06-18 DIAGNOSIS — G311 Senile degeneration of brain, not elsewhere classified: Secondary | ICD-10-CM | POA: Diagnosis not present

## 2022-06-21 DIAGNOSIS — B351 Tinea unguium: Secondary | ICD-10-CM | POA: Diagnosis not present

## 2022-06-21 DIAGNOSIS — R159 Full incontinence of feces: Secondary | ICD-10-CM | POA: Diagnosis not present

## 2022-06-21 DIAGNOSIS — I739 Peripheral vascular disease, unspecified: Secondary | ICD-10-CM | POA: Diagnosis not present

## 2022-06-21 DIAGNOSIS — E43 Unspecified severe protein-calorie malnutrition: Secondary | ICD-10-CM | POA: Diagnosis not present

## 2022-06-21 DIAGNOSIS — R1312 Dysphagia, oropharyngeal phase: Secondary | ICD-10-CM | POA: Diagnosis not present

## 2022-06-21 DIAGNOSIS — F0280 Dementia in other diseases classified elsewhere without behavioral disturbance: Secondary | ICD-10-CM | POA: Diagnosis not present

## 2022-06-21 DIAGNOSIS — R627 Adult failure to thrive: Secondary | ICD-10-CM | POA: Diagnosis not present

## 2022-06-21 DIAGNOSIS — G311 Senile degeneration of brain, not elsewhere classified: Secondary | ICD-10-CM | POA: Diagnosis not present

## 2022-06-22 DIAGNOSIS — R1312 Dysphagia, oropharyngeal phase: Secondary | ICD-10-CM | POA: Diagnosis not present

## 2022-06-22 DIAGNOSIS — R627 Adult failure to thrive: Secondary | ICD-10-CM | POA: Diagnosis not present

## 2022-06-22 DIAGNOSIS — G311 Senile degeneration of brain, not elsewhere classified: Secondary | ICD-10-CM | POA: Diagnosis not present

## 2022-06-22 DIAGNOSIS — R159 Full incontinence of feces: Secondary | ICD-10-CM | POA: Diagnosis not present

## 2022-06-22 DIAGNOSIS — E43 Unspecified severe protein-calorie malnutrition: Secondary | ICD-10-CM | POA: Diagnosis not present

## 2022-06-22 DIAGNOSIS — F0280 Dementia in other diseases classified elsewhere without behavioral disturbance: Secondary | ICD-10-CM | POA: Diagnosis not present

## 2022-06-23 DIAGNOSIS — F0280 Dementia in other diseases classified elsewhere without behavioral disturbance: Secondary | ICD-10-CM | POA: Diagnosis not present

## 2022-06-23 DIAGNOSIS — R627 Adult failure to thrive: Secondary | ICD-10-CM | POA: Diagnosis not present

## 2022-06-23 DIAGNOSIS — G311 Senile degeneration of brain, not elsewhere classified: Secondary | ICD-10-CM | POA: Diagnosis not present

## 2022-06-23 DIAGNOSIS — R159 Full incontinence of feces: Secondary | ICD-10-CM | POA: Diagnosis not present

## 2022-06-23 DIAGNOSIS — R1312 Dysphagia, oropharyngeal phase: Secondary | ICD-10-CM | POA: Diagnosis not present

## 2022-06-23 DIAGNOSIS — E43 Unspecified severe protein-calorie malnutrition: Secondary | ICD-10-CM | POA: Diagnosis not present

## 2022-06-30 DIAGNOSIS — R159 Full incontinence of feces: Secondary | ICD-10-CM | POA: Diagnosis not present

## 2022-06-30 DIAGNOSIS — F0280 Dementia in other diseases classified elsewhere without behavioral disturbance: Secondary | ICD-10-CM | POA: Diagnosis not present

## 2022-06-30 DIAGNOSIS — R1312 Dysphagia, oropharyngeal phase: Secondary | ICD-10-CM | POA: Diagnosis not present

## 2022-06-30 DIAGNOSIS — G311 Senile degeneration of brain, not elsewhere classified: Secondary | ICD-10-CM | POA: Diagnosis not present

## 2022-06-30 DIAGNOSIS — R627 Adult failure to thrive: Secondary | ICD-10-CM | POA: Diagnosis not present

## 2022-06-30 DIAGNOSIS — E43 Unspecified severe protein-calorie malnutrition: Secondary | ICD-10-CM | POA: Diagnosis not present

## 2022-07-01 DIAGNOSIS — F0280 Dementia in other diseases classified elsewhere without behavioral disturbance: Secondary | ICD-10-CM | POA: Diagnosis not present

## 2022-07-01 DIAGNOSIS — R1312 Dysphagia, oropharyngeal phase: Secondary | ICD-10-CM | POA: Diagnosis not present

## 2022-07-01 DIAGNOSIS — E43 Unspecified severe protein-calorie malnutrition: Secondary | ICD-10-CM | POA: Diagnosis not present

## 2022-07-01 DIAGNOSIS — R159 Full incontinence of feces: Secondary | ICD-10-CM | POA: Diagnosis not present

## 2022-07-01 DIAGNOSIS — R627 Adult failure to thrive: Secondary | ICD-10-CM | POA: Diagnosis not present

## 2022-07-01 DIAGNOSIS — G311 Senile degeneration of brain, not elsewhere classified: Secondary | ICD-10-CM | POA: Diagnosis not present

## 2022-07-02 DIAGNOSIS — R1312 Dysphagia, oropharyngeal phase: Secondary | ICD-10-CM | POA: Diagnosis not present

## 2022-07-02 DIAGNOSIS — R627 Adult failure to thrive: Secondary | ICD-10-CM | POA: Diagnosis not present

## 2022-07-02 DIAGNOSIS — E43 Unspecified severe protein-calorie malnutrition: Secondary | ICD-10-CM | POA: Diagnosis not present

## 2022-07-02 DIAGNOSIS — G311 Senile degeneration of brain, not elsewhere classified: Secondary | ICD-10-CM | POA: Diagnosis not present

## 2022-07-02 DIAGNOSIS — F0280 Dementia in other diseases classified elsewhere without behavioral disturbance: Secondary | ICD-10-CM | POA: Diagnosis not present

## 2022-07-02 DIAGNOSIS — R159 Full incontinence of feces: Secondary | ICD-10-CM | POA: Diagnosis not present

## 2022-07-05 DIAGNOSIS — R1312 Dysphagia, oropharyngeal phase: Secondary | ICD-10-CM | POA: Diagnosis not present

## 2022-07-05 DIAGNOSIS — Z741 Need for assistance with personal care: Secondary | ICD-10-CM | POA: Diagnosis not present

## 2022-07-05 DIAGNOSIS — Z20828 Contact with and (suspected) exposure to other viral communicable diseases: Secondary | ICD-10-CM | POA: Diagnosis not present

## 2022-07-05 DIAGNOSIS — Z515 Encounter for palliative care: Secondary | ICD-10-CM | POA: Diagnosis not present

## 2022-07-05 DIAGNOSIS — E43 Unspecified severe protein-calorie malnutrition: Secondary | ICD-10-CM | POA: Diagnosis not present

## 2022-07-05 DIAGNOSIS — R159 Full incontinence of feces: Secondary | ICD-10-CM | POA: Diagnosis not present

## 2022-07-05 DIAGNOSIS — Z66 Do not resuscitate: Secondary | ICD-10-CM | POA: Diagnosis not present

## 2022-07-05 DIAGNOSIS — F0280 Dementia in other diseases classified elsewhere without behavioral disturbance: Secondary | ICD-10-CM | POA: Diagnosis not present

## 2022-07-05 DIAGNOSIS — Z7401 Bed confinement status: Secondary | ICD-10-CM | POA: Diagnosis not present

## 2022-07-05 DIAGNOSIS — G311 Senile degeneration of brain, not elsewhere classified: Secondary | ICD-10-CM | POA: Diagnosis not present

## 2022-07-05 DIAGNOSIS — R627 Adult failure to thrive: Secondary | ICD-10-CM | POA: Diagnosis not present

## 2022-07-05 DIAGNOSIS — R32 Unspecified urinary incontinence: Secondary | ICD-10-CM | POA: Diagnosis not present

## 2022-07-07 DIAGNOSIS — R1312 Dysphagia, oropharyngeal phase: Secondary | ICD-10-CM | POA: Diagnosis not present

## 2022-07-07 DIAGNOSIS — F0280 Dementia in other diseases classified elsewhere without behavioral disturbance: Secondary | ICD-10-CM | POA: Diagnosis not present

## 2022-07-07 DIAGNOSIS — G311 Senile degeneration of brain, not elsewhere classified: Secondary | ICD-10-CM | POA: Diagnosis not present

## 2022-07-07 DIAGNOSIS — R159 Full incontinence of feces: Secondary | ICD-10-CM | POA: Diagnosis not present

## 2022-07-07 DIAGNOSIS — E43 Unspecified severe protein-calorie malnutrition: Secondary | ICD-10-CM | POA: Diagnosis not present

## 2022-07-07 DIAGNOSIS — R627 Adult failure to thrive: Secondary | ICD-10-CM | POA: Diagnosis not present

## 2022-07-08 DIAGNOSIS — R1312 Dysphagia, oropharyngeal phase: Secondary | ICD-10-CM | POA: Diagnosis not present

## 2022-07-08 DIAGNOSIS — R159 Full incontinence of feces: Secondary | ICD-10-CM | POA: Diagnosis not present

## 2022-07-08 DIAGNOSIS — E43 Unspecified severe protein-calorie malnutrition: Secondary | ICD-10-CM | POA: Diagnosis not present

## 2022-07-08 DIAGNOSIS — R627 Adult failure to thrive: Secondary | ICD-10-CM | POA: Diagnosis not present

## 2022-07-08 DIAGNOSIS — G311 Senile degeneration of brain, not elsewhere classified: Secondary | ICD-10-CM | POA: Diagnosis not present

## 2022-07-08 DIAGNOSIS — F0280 Dementia in other diseases classified elsewhere without behavioral disturbance: Secondary | ICD-10-CM | POA: Diagnosis not present

## 2022-07-12 DIAGNOSIS — R627 Adult failure to thrive: Secondary | ICD-10-CM | POA: Diagnosis not present

## 2022-07-12 DIAGNOSIS — F0280 Dementia in other diseases classified elsewhere without behavioral disturbance: Secondary | ICD-10-CM | POA: Diagnosis not present

## 2022-07-12 DIAGNOSIS — E43 Unspecified severe protein-calorie malnutrition: Secondary | ICD-10-CM | POA: Diagnosis not present

## 2022-07-12 DIAGNOSIS — R159 Full incontinence of feces: Secondary | ICD-10-CM | POA: Diagnosis not present

## 2022-07-12 DIAGNOSIS — R1312 Dysphagia, oropharyngeal phase: Secondary | ICD-10-CM | POA: Diagnosis not present

## 2022-07-12 DIAGNOSIS — G311 Senile degeneration of brain, not elsewhere classified: Secondary | ICD-10-CM | POA: Diagnosis not present

## 2022-07-13 DIAGNOSIS — R627 Adult failure to thrive: Secondary | ICD-10-CM | POA: Diagnosis not present

## 2022-07-13 DIAGNOSIS — E43 Unspecified severe protein-calorie malnutrition: Secondary | ICD-10-CM | POA: Diagnosis not present

## 2022-07-13 DIAGNOSIS — K5900 Constipation, unspecified: Secondary | ICD-10-CM

## 2022-07-13 DIAGNOSIS — E039 Hypothyroidism, unspecified: Secondary | ICD-10-CM

## 2022-07-13 DIAGNOSIS — F0280 Dementia in other diseases classified elsewhere without behavioral disturbance: Secondary | ICD-10-CM | POA: Diagnosis not present

## 2022-07-13 DIAGNOSIS — R52 Pain, unspecified: Secondary | ICD-10-CM

## 2022-07-13 DIAGNOSIS — R1312 Dysphagia, oropharyngeal phase: Secondary | ICD-10-CM | POA: Diagnosis not present

## 2022-07-13 DIAGNOSIS — Z6825 Body mass index (BMI) 25.0-25.9, adult: Secondary | ICD-10-CM

## 2022-07-13 DIAGNOSIS — F0390 Unspecified dementia without behavioral disturbance: Secondary | ICD-10-CM

## 2022-07-13 DIAGNOSIS — G311 Senile degeneration of brain, not elsewhere classified: Secondary | ICD-10-CM | POA: Diagnosis not present

## 2022-07-13 DIAGNOSIS — R159 Full incontinence of feces: Secondary | ICD-10-CM | POA: Diagnosis not present

## 2022-07-13 DIAGNOSIS — K219 Gastro-esophageal reflux disease without esophagitis: Secondary | ICD-10-CM

## 2022-07-14 DIAGNOSIS — R627 Adult failure to thrive: Secondary | ICD-10-CM | POA: Diagnosis not present

## 2022-07-14 DIAGNOSIS — E43 Unspecified severe protein-calorie malnutrition: Secondary | ICD-10-CM | POA: Diagnosis not present

## 2022-07-14 DIAGNOSIS — R159 Full incontinence of feces: Secondary | ICD-10-CM | POA: Diagnosis not present

## 2022-07-14 DIAGNOSIS — R1312 Dysphagia, oropharyngeal phase: Secondary | ICD-10-CM | POA: Diagnosis not present

## 2022-07-14 DIAGNOSIS — F0280 Dementia in other diseases classified elsewhere without behavioral disturbance: Secondary | ICD-10-CM | POA: Diagnosis not present

## 2022-07-14 DIAGNOSIS — G311 Senile degeneration of brain, not elsewhere classified: Secondary | ICD-10-CM | POA: Diagnosis not present

## 2022-07-16 DIAGNOSIS — R1312 Dysphagia, oropharyngeal phase: Secondary | ICD-10-CM | POA: Diagnosis not present

## 2022-07-16 DIAGNOSIS — G311 Senile degeneration of brain, not elsewhere classified: Secondary | ICD-10-CM | POA: Diagnosis not present

## 2022-07-16 DIAGNOSIS — R627 Adult failure to thrive: Secondary | ICD-10-CM | POA: Diagnosis not present

## 2022-07-16 DIAGNOSIS — R159 Full incontinence of feces: Secondary | ICD-10-CM | POA: Diagnosis not present

## 2022-07-16 DIAGNOSIS — E43 Unspecified severe protein-calorie malnutrition: Secondary | ICD-10-CM | POA: Diagnosis not present

## 2022-07-16 DIAGNOSIS — F0280 Dementia in other diseases classified elsewhere without behavioral disturbance: Secondary | ICD-10-CM | POA: Diagnosis not present

## 2022-07-19 DIAGNOSIS — R627 Adult failure to thrive: Secondary | ICD-10-CM | POA: Diagnosis not present

## 2022-07-19 DIAGNOSIS — R159 Full incontinence of feces: Secondary | ICD-10-CM | POA: Diagnosis not present

## 2022-07-19 DIAGNOSIS — G311 Senile degeneration of brain, not elsewhere classified: Secondary | ICD-10-CM | POA: Diagnosis not present

## 2022-07-19 DIAGNOSIS — F0280 Dementia in other diseases classified elsewhere without behavioral disturbance: Secondary | ICD-10-CM | POA: Diagnosis not present

## 2022-07-19 DIAGNOSIS — R1312 Dysphagia, oropharyngeal phase: Secondary | ICD-10-CM | POA: Diagnosis not present

## 2022-07-19 DIAGNOSIS — E43 Unspecified severe protein-calorie malnutrition: Secondary | ICD-10-CM | POA: Diagnosis not present

## 2022-07-20 DIAGNOSIS — R627 Adult failure to thrive: Secondary | ICD-10-CM | POA: Diagnosis not present

## 2022-07-20 DIAGNOSIS — R1312 Dysphagia, oropharyngeal phase: Secondary | ICD-10-CM | POA: Diagnosis not present

## 2022-07-20 DIAGNOSIS — E43 Unspecified severe protein-calorie malnutrition: Secondary | ICD-10-CM | POA: Diagnosis not present

## 2022-07-20 DIAGNOSIS — R159 Full incontinence of feces: Secondary | ICD-10-CM | POA: Diagnosis not present

## 2022-07-20 DIAGNOSIS — G311 Senile degeneration of brain, not elsewhere classified: Secondary | ICD-10-CM | POA: Diagnosis not present

## 2022-07-20 DIAGNOSIS — F0280 Dementia in other diseases classified elsewhere without behavioral disturbance: Secondary | ICD-10-CM | POA: Diagnosis not present

## 2022-07-21 DIAGNOSIS — E43 Unspecified severe protein-calorie malnutrition: Secondary | ICD-10-CM | POA: Diagnosis not present

## 2022-07-21 DIAGNOSIS — R159 Full incontinence of feces: Secondary | ICD-10-CM | POA: Diagnosis not present

## 2022-07-21 DIAGNOSIS — F0280 Dementia in other diseases classified elsewhere without behavioral disturbance: Secondary | ICD-10-CM | POA: Diagnosis not present

## 2022-07-21 DIAGNOSIS — G311 Senile degeneration of brain, not elsewhere classified: Secondary | ICD-10-CM | POA: Diagnosis not present

## 2022-07-21 DIAGNOSIS — R627 Adult failure to thrive: Secondary | ICD-10-CM | POA: Diagnosis not present

## 2022-07-21 DIAGNOSIS — R1312 Dysphagia, oropharyngeal phase: Secondary | ICD-10-CM | POA: Diagnosis not present

## 2022-07-26 DIAGNOSIS — R1312 Dysphagia, oropharyngeal phase: Secondary | ICD-10-CM | POA: Diagnosis not present

## 2022-07-26 DIAGNOSIS — R159 Full incontinence of feces: Secondary | ICD-10-CM | POA: Diagnosis not present

## 2022-07-26 DIAGNOSIS — R627 Adult failure to thrive: Secondary | ICD-10-CM | POA: Diagnosis not present

## 2022-07-26 DIAGNOSIS — F0280 Dementia in other diseases classified elsewhere without behavioral disturbance: Secondary | ICD-10-CM | POA: Diagnosis not present

## 2022-07-26 DIAGNOSIS — E43 Unspecified severe protein-calorie malnutrition: Secondary | ICD-10-CM | POA: Diagnosis not present

## 2022-07-26 DIAGNOSIS — G311 Senile degeneration of brain, not elsewhere classified: Secondary | ICD-10-CM | POA: Diagnosis not present

## 2022-07-27 DIAGNOSIS — F0280 Dementia in other diseases classified elsewhere without behavioral disturbance: Secondary | ICD-10-CM | POA: Diagnosis not present

## 2022-07-27 DIAGNOSIS — E43 Unspecified severe protein-calorie malnutrition: Secondary | ICD-10-CM | POA: Diagnosis not present

## 2022-07-27 DIAGNOSIS — R159 Full incontinence of feces: Secondary | ICD-10-CM | POA: Diagnosis not present

## 2022-07-27 DIAGNOSIS — G311 Senile degeneration of brain, not elsewhere classified: Secondary | ICD-10-CM | POA: Diagnosis not present

## 2022-07-27 DIAGNOSIS — R627 Adult failure to thrive: Secondary | ICD-10-CM | POA: Diagnosis not present

## 2022-07-27 DIAGNOSIS — R1312 Dysphagia, oropharyngeal phase: Secondary | ICD-10-CM | POA: Diagnosis not present

## 2022-07-28 DIAGNOSIS — E569 Vitamin deficiency, unspecified: Secondary | ICD-10-CM | POA: Diagnosis not present

## 2022-07-28 DIAGNOSIS — R1312 Dysphagia, oropharyngeal phase: Secondary | ICD-10-CM | POA: Diagnosis not present

## 2022-07-28 DIAGNOSIS — E039 Hypothyroidism, unspecified: Secondary | ICD-10-CM | POA: Diagnosis not present

## 2022-07-28 DIAGNOSIS — R627 Adult failure to thrive: Secondary | ICD-10-CM | POA: Diagnosis not present

## 2022-07-28 DIAGNOSIS — E785 Hyperlipidemia, unspecified: Secondary | ICD-10-CM | POA: Diagnosis not present

## 2022-07-28 DIAGNOSIS — E43 Unspecified severe protein-calorie malnutrition: Secondary | ICD-10-CM | POA: Diagnosis not present

## 2022-07-28 DIAGNOSIS — G311 Senile degeneration of brain, not elsewhere classified: Secondary | ICD-10-CM | POA: Diagnosis not present

## 2022-07-28 DIAGNOSIS — R159 Full incontinence of feces: Secondary | ICD-10-CM | POA: Diagnosis not present

## 2022-07-28 DIAGNOSIS — F0390 Unspecified dementia without behavioral disturbance: Secondary | ICD-10-CM | POA: Diagnosis not present

## 2022-07-28 DIAGNOSIS — F0280 Dementia in other diseases classified elsewhere without behavioral disturbance: Secondary | ICD-10-CM | POA: Diagnosis not present

## 2022-08-02 DIAGNOSIS — R627 Adult failure to thrive: Secondary | ICD-10-CM | POA: Diagnosis not present

## 2022-08-02 DIAGNOSIS — G311 Senile degeneration of brain, not elsewhere classified: Secondary | ICD-10-CM | POA: Diagnosis not present

## 2022-08-02 DIAGNOSIS — F0280 Dementia in other diseases classified elsewhere without behavioral disturbance: Secondary | ICD-10-CM | POA: Diagnosis not present

## 2022-08-02 DIAGNOSIS — R1312 Dysphagia, oropharyngeal phase: Secondary | ICD-10-CM | POA: Diagnosis not present

## 2022-08-02 DIAGNOSIS — E43 Unspecified severe protein-calorie malnutrition: Secondary | ICD-10-CM | POA: Diagnosis not present

## 2022-08-02 DIAGNOSIS — R159 Full incontinence of feces: Secondary | ICD-10-CM | POA: Diagnosis not present

## 2022-08-03 DIAGNOSIS — E43 Unspecified severe protein-calorie malnutrition: Secondary | ICD-10-CM | POA: Diagnosis not present

## 2022-08-03 DIAGNOSIS — R1312 Dysphagia, oropharyngeal phase: Secondary | ICD-10-CM | POA: Diagnosis not present

## 2022-08-03 DIAGNOSIS — G311 Senile degeneration of brain, not elsewhere classified: Secondary | ICD-10-CM | POA: Diagnosis not present

## 2022-08-03 DIAGNOSIS — R627 Adult failure to thrive: Secondary | ICD-10-CM | POA: Diagnosis not present

## 2022-08-03 DIAGNOSIS — R159 Full incontinence of feces: Secondary | ICD-10-CM | POA: Diagnosis not present

## 2022-08-03 DIAGNOSIS — F0280 Dementia in other diseases classified elsewhere without behavioral disturbance: Secondary | ICD-10-CM | POA: Diagnosis not present

## 2022-08-04 DIAGNOSIS — R159 Full incontinence of feces: Secondary | ICD-10-CM | POA: Diagnosis not present

## 2022-08-04 DIAGNOSIS — G311 Senile degeneration of brain, not elsewhere classified: Secondary | ICD-10-CM | POA: Diagnosis not present

## 2022-08-04 DIAGNOSIS — E43 Unspecified severe protein-calorie malnutrition: Secondary | ICD-10-CM | POA: Diagnosis not present

## 2022-08-04 DIAGNOSIS — R627 Adult failure to thrive: Secondary | ICD-10-CM | POA: Diagnosis not present

## 2022-08-04 DIAGNOSIS — F0280 Dementia in other diseases classified elsewhere without behavioral disturbance: Secondary | ICD-10-CM | POA: Diagnosis not present

## 2022-08-04 DIAGNOSIS — R1312 Dysphagia, oropharyngeal phase: Secondary | ICD-10-CM | POA: Diagnosis not present

## 2022-08-05 DIAGNOSIS — R1312 Dysphagia, oropharyngeal phase: Secondary | ICD-10-CM | POA: Diagnosis not present

## 2022-08-05 DIAGNOSIS — R159 Full incontinence of feces: Secondary | ICD-10-CM | POA: Diagnosis not present

## 2022-08-05 DIAGNOSIS — Z741 Need for assistance with personal care: Secondary | ICD-10-CM | POA: Diagnosis not present

## 2022-08-05 DIAGNOSIS — Z20828 Contact with and (suspected) exposure to other viral communicable diseases: Secondary | ICD-10-CM | POA: Diagnosis not present

## 2022-08-05 DIAGNOSIS — E43 Unspecified severe protein-calorie malnutrition: Secondary | ICD-10-CM | POA: Diagnosis not present

## 2022-08-05 DIAGNOSIS — G311 Senile degeneration of brain, not elsewhere classified: Secondary | ICD-10-CM | POA: Diagnosis not present

## 2022-08-05 DIAGNOSIS — Z515 Encounter for palliative care: Secondary | ICD-10-CM | POA: Diagnosis not present

## 2022-08-05 DIAGNOSIS — F0280 Dementia in other diseases classified elsewhere without behavioral disturbance: Secondary | ICD-10-CM | POA: Diagnosis not present

## 2022-08-05 DIAGNOSIS — R627 Adult failure to thrive: Secondary | ICD-10-CM | POA: Diagnosis not present

## 2022-08-05 DIAGNOSIS — R32 Unspecified urinary incontinence: Secondary | ICD-10-CM | POA: Diagnosis not present

## 2022-08-05 DIAGNOSIS — Z7401 Bed confinement status: Secondary | ICD-10-CM | POA: Diagnosis not present

## 2022-08-05 DIAGNOSIS — Z66 Do not resuscitate: Secondary | ICD-10-CM | POA: Diagnosis not present

## 2022-08-09 DIAGNOSIS — G311 Senile degeneration of brain, not elsewhere classified: Secondary | ICD-10-CM | POA: Diagnosis not present

## 2022-08-09 DIAGNOSIS — R627 Adult failure to thrive: Secondary | ICD-10-CM | POA: Diagnosis not present

## 2022-08-09 DIAGNOSIS — F0280 Dementia in other diseases classified elsewhere without behavioral disturbance: Secondary | ICD-10-CM | POA: Diagnosis not present

## 2022-08-09 DIAGNOSIS — R159 Full incontinence of feces: Secondary | ICD-10-CM | POA: Diagnosis not present

## 2022-08-09 DIAGNOSIS — E43 Unspecified severe protein-calorie malnutrition: Secondary | ICD-10-CM | POA: Diagnosis not present

## 2022-08-09 DIAGNOSIS — R1312 Dysphagia, oropharyngeal phase: Secondary | ICD-10-CM | POA: Diagnosis not present

## 2022-08-11 DIAGNOSIS — R627 Adult failure to thrive: Secondary | ICD-10-CM | POA: Diagnosis not present

## 2022-08-11 DIAGNOSIS — G311 Senile degeneration of brain, not elsewhere classified: Secondary | ICD-10-CM | POA: Diagnosis not present

## 2022-08-11 DIAGNOSIS — F0280 Dementia in other diseases classified elsewhere without behavioral disturbance: Secondary | ICD-10-CM | POA: Diagnosis not present

## 2022-08-11 DIAGNOSIS — R159 Full incontinence of feces: Secondary | ICD-10-CM | POA: Diagnosis not present

## 2022-08-11 DIAGNOSIS — R1312 Dysphagia, oropharyngeal phase: Secondary | ICD-10-CM | POA: Diagnosis not present

## 2022-08-11 DIAGNOSIS — E43 Unspecified severe protein-calorie malnutrition: Secondary | ICD-10-CM | POA: Diagnosis not present

## 2022-08-16 DIAGNOSIS — R627 Adult failure to thrive: Secondary | ICD-10-CM | POA: Diagnosis not present

## 2022-08-16 DIAGNOSIS — G311 Senile degeneration of brain, not elsewhere classified: Secondary | ICD-10-CM | POA: Diagnosis not present

## 2022-08-16 DIAGNOSIS — R159 Full incontinence of feces: Secondary | ICD-10-CM | POA: Diagnosis not present

## 2022-08-16 DIAGNOSIS — E43 Unspecified severe protein-calorie malnutrition: Secondary | ICD-10-CM | POA: Diagnosis not present

## 2022-08-16 DIAGNOSIS — R1312 Dysphagia, oropharyngeal phase: Secondary | ICD-10-CM | POA: Diagnosis not present

## 2022-08-16 DIAGNOSIS — F0280 Dementia in other diseases classified elsewhere without behavioral disturbance: Secondary | ICD-10-CM | POA: Diagnosis not present

## 2022-08-17 DIAGNOSIS — R159 Full incontinence of feces: Secondary | ICD-10-CM | POA: Diagnosis not present

## 2022-08-17 DIAGNOSIS — R627 Adult failure to thrive: Secondary | ICD-10-CM | POA: Diagnosis not present

## 2022-08-17 DIAGNOSIS — F0280 Dementia in other diseases classified elsewhere without behavioral disturbance: Secondary | ICD-10-CM | POA: Diagnosis not present

## 2022-08-17 DIAGNOSIS — R1312 Dysphagia, oropharyngeal phase: Secondary | ICD-10-CM | POA: Diagnosis not present

## 2022-08-17 DIAGNOSIS — G311 Senile degeneration of brain, not elsewhere classified: Secondary | ICD-10-CM | POA: Diagnosis not present

## 2022-08-17 DIAGNOSIS — E43 Unspecified severe protein-calorie malnutrition: Secondary | ICD-10-CM | POA: Diagnosis not present

## 2022-08-18 DIAGNOSIS — G311 Senile degeneration of brain, not elsewhere classified: Secondary | ICD-10-CM | POA: Diagnosis not present

## 2022-08-18 DIAGNOSIS — R1312 Dysphagia, oropharyngeal phase: Secondary | ICD-10-CM | POA: Diagnosis not present

## 2022-08-18 DIAGNOSIS — F0280 Dementia in other diseases classified elsewhere without behavioral disturbance: Secondary | ICD-10-CM | POA: Diagnosis not present

## 2022-08-18 DIAGNOSIS — R627 Adult failure to thrive: Secondary | ICD-10-CM | POA: Diagnosis not present

## 2022-08-18 DIAGNOSIS — E43 Unspecified severe protein-calorie malnutrition: Secondary | ICD-10-CM | POA: Diagnosis not present

## 2022-08-18 DIAGNOSIS — R159 Full incontinence of feces: Secondary | ICD-10-CM | POA: Diagnosis not present

## 2022-08-23 DIAGNOSIS — F0280 Dementia in other diseases classified elsewhere without behavioral disturbance: Secondary | ICD-10-CM | POA: Diagnosis not present

## 2022-08-23 DIAGNOSIS — G311 Senile degeneration of brain, not elsewhere classified: Secondary | ICD-10-CM | POA: Diagnosis not present

## 2022-08-23 DIAGNOSIS — R159 Full incontinence of feces: Secondary | ICD-10-CM | POA: Diagnosis not present

## 2022-08-23 DIAGNOSIS — E43 Unspecified severe protein-calorie malnutrition: Secondary | ICD-10-CM | POA: Diagnosis not present

## 2022-08-23 DIAGNOSIS — R1312 Dysphagia, oropharyngeal phase: Secondary | ICD-10-CM | POA: Diagnosis not present

## 2022-08-23 DIAGNOSIS — R627 Adult failure to thrive: Secondary | ICD-10-CM | POA: Diagnosis not present

## 2022-08-25 DIAGNOSIS — R627 Adult failure to thrive: Secondary | ICD-10-CM | POA: Diagnosis not present

## 2022-08-25 DIAGNOSIS — E43 Unspecified severe protein-calorie malnutrition: Secondary | ICD-10-CM | POA: Diagnosis not present

## 2022-08-25 DIAGNOSIS — G311 Senile degeneration of brain, not elsewhere classified: Secondary | ICD-10-CM | POA: Diagnosis not present

## 2022-08-25 DIAGNOSIS — F0280 Dementia in other diseases classified elsewhere without behavioral disturbance: Secondary | ICD-10-CM | POA: Diagnosis not present

## 2022-08-25 DIAGNOSIS — R1312 Dysphagia, oropharyngeal phase: Secondary | ICD-10-CM | POA: Diagnosis not present

## 2022-08-25 DIAGNOSIS — R159 Full incontinence of feces: Secondary | ICD-10-CM | POA: Diagnosis not present

## 2022-08-26 DIAGNOSIS — F0390 Unspecified dementia without behavioral disturbance: Secondary | ICD-10-CM

## 2022-08-26 DIAGNOSIS — R2243 Localized swelling, mass and lump, lower limb, bilateral: Secondary | ICD-10-CM

## 2022-08-30 DIAGNOSIS — G311 Senile degeneration of brain, not elsewhere classified: Secondary | ICD-10-CM | POA: Diagnosis not present

## 2022-08-30 DIAGNOSIS — R1312 Dysphagia, oropharyngeal phase: Secondary | ICD-10-CM | POA: Diagnosis not present

## 2022-08-30 DIAGNOSIS — F0280 Dementia in other diseases classified elsewhere without behavioral disturbance: Secondary | ICD-10-CM | POA: Diagnosis not present

## 2022-08-30 DIAGNOSIS — R627 Adult failure to thrive: Secondary | ICD-10-CM | POA: Diagnosis not present

## 2022-08-30 DIAGNOSIS — R159 Full incontinence of feces: Secondary | ICD-10-CM | POA: Diagnosis not present

## 2022-08-30 DIAGNOSIS — E43 Unspecified severe protein-calorie malnutrition: Secondary | ICD-10-CM | POA: Diagnosis not present

## 2022-09-01 DIAGNOSIS — G311 Senile degeneration of brain, not elsewhere classified: Secondary | ICD-10-CM | POA: Diagnosis not present

## 2022-09-01 DIAGNOSIS — R627 Adult failure to thrive: Secondary | ICD-10-CM | POA: Diagnosis not present

## 2022-09-01 DIAGNOSIS — R1312 Dysphagia, oropharyngeal phase: Secondary | ICD-10-CM | POA: Diagnosis not present

## 2022-09-01 DIAGNOSIS — E43 Unspecified severe protein-calorie malnutrition: Secondary | ICD-10-CM | POA: Diagnosis not present

## 2022-09-01 DIAGNOSIS — F0280 Dementia in other diseases classified elsewhere without behavioral disturbance: Secondary | ICD-10-CM | POA: Diagnosis not present

## 2022-09-01 DIAGNOSIS — R159 Full incontinence of feces: Secondary | ICD-10-CM | POA: Diagnosis not present

## 2022-09-03 DIAGNOSIS — G311 Senile degeneration of brain, not elsewhere classified: Secondary | ICD-10-CM | POA: Diagnosis not present

## 2022-09-03 DIAGNOSIS — R159 Full incontinence of feces: Secondary | ICD-10-CM | POA: Diagnosis not present

## 2022-09-03 DIAGNOSIS — Z66 Do not resuscitate: Secondary | ICD-10-CM | POA: Diagnosis not present

## 2022-09-03 DIAGNOSIS — F0280 Dementia in other diseases classified elsewhere without behavioral disturbance: Secondary | ICD-10-CM | POA: Diagnosis not present

## 2022-09-03 DIAGNOSIS — E43 Unspecified severe protein-calorie malnutrition: Secondary | ICD-10-CM | POA: Diagnosis not present

## 2022-09-03 DIAGNOSIS — Z741 Need for assistance with personal care: Secondary | ICD-10-CM | POA: Diagnosis not present

## 2022-09-03 DIAGNOSIS — Z7401 Bed confinement status: Secondary | ICD-10-CM | POA: Diagnosis not present

## 2022-09-03 DIAGNOSIS — Z515 Encounter for palliative care: Secondary | ICD-10-CM | POA: Diagnosis not present

## 2022-09-03 DIAGNOSIS — R1312 Dysphagia, oropharyngeal phase: Secondary | ICD-10-CM | POA: Diagnosis not present

## 2022-09-03 DIAGNOSIS — R627 Adult failure to thrive: Secondary | ICD-10-CM | POA: Diagnosis not present

## 2022-09-03 DIAGNOSIS — Z20828 Contact with and (suspected) exposure to other viral communicable diseases: Secondary | ICD-10-CM | POA: Diagnosis not present

## 2022-09-03 DIAGNOSIS — R32 Unspecified urinary incontinence: Secondary | ICD-10-CM | POA: Diagnosis not present

## 2022-09-06 DIAGNOSIS — R1312 Dysphagia, oropharyngeal phase: Secondary | ICD-10-CM | POA: Diagnosis not present

## 2022-09-06 DIAGNOSIS — G311 Senile degeneration of brain, not elsewhere classified: Secondary | ICD-10-CM | POA: Diagnosis not present

## 2022-09-06 DIAGNOSIS — R627 Adult failure to thrive: Secondary | ICD-10-CM | POA: Diagnosis not present

## 2022-09-06 DIAGNOSIS — E43 Unspecified severe protein-calorie malnutrition: Secondary | ICD-10-CM | POA: Diagnosis not present

## 2022-09-06 DIAGNOSIS — R159 Full incontinence of feces: Secondary | ICD-10-CM | POA: Diagnosis not present

## 2022-09-06 DIAGNOSIS — F0280 Dementia in other diseases classified elsewhere without behavioral disturbance: Secondary | ICD-10-CM | POA: Diagnosis not present

## 2022-09-08 DIAGNOSIS — G311 Senile degeneration of brain, not elsewhere classified: Secondary | ICD-10-CM | POA: Diagnosis not present

## 2022-09-08 DIAGNOSIS — E43 Unspecified severe protein-calorie malnutrition: Secondary | ICD-10-CM | POA: Diagnosis not present

## 2022-09-08 DIAGNOSIS — R627 Adult failure to thrive: Secondary | ICD-10-CM | POA: Diagnosis not present

## 2022-09-08 DIAGNOSIS — R1312 Dysphagia, oropharyngeal phase: Secondary | ICD-10-CM | POA: Diagnosis not present

## 2022-09-08 DIAGNOSIS — F0280 Dementia in other diseases classified elsewhere without behavioral disturbance: Secondary | ICD-10-CM | POA: Diagnosis not present

## 2022-09-08 DIAGNOSIS — R159 Full incontinence of feces: Secondary | ICD-10-CM | POA: Diagnosis not present

## 2022-09-13 DIAGNOSIS — R159 Full incontinence of feces: Secondary | ICD-10-CM | POA: Diagnosis not present

## 2022-09-13 DIAGNOSIS — R627 Adult failure to thrive: Secondary | ICD-10-CM | POA: Diagnosis not present

## 2022-09-13 DIAGNOSIS — E43 Unspecified severe protein-calorie malnutrition: Secondary | ICD-10-CM | POA: Diagnosis not present

## 2022-09-13 DIAGNOSIS — F0280 Dementia in other diseases classified elsewhere without behavioral disturbance: Secondary | ICD-10-CM | POA: Diagnosis not present

## 2022-09-13 DIAGNOSIS — R1312 Dysphagia, oropharyngeal phase: Secondary | ICD-10-CM | POA: Diagnosis not present

## 2022-09-13 DIAGNOSIS — G311 Senile degeneration of brain, not elsewhere classified: Secondary | ICD-10-CM | POA: Diagnosis not present

## 2022-09-14 DIAGNOSIS — G311 Senile degeneration of brain, not elsewhere classified: Secondary | ICD-10-CM | POA: Diagnosis not present

## 2022-09-14 DIAGNOSIS — R627 Adult failure to thrive: Secondary | ICD-10-CM | POA: Diagnosis not present

## 2022-09-14 DIAGNOSIS — F0280 Dementia in other diseases classified elsewhere without behavioral disturbance: Secondary | ICD-10-CM | POA: Diagnosis not present

## 2022-09-14 DIAGNOSIS — R1312 Dysphagia, oropharyngeal phase: Secondary | ICD-10-CM | POA: Diagnosis not present

## 2022-09-14 DIAGNOSIS — R159 Full incontinence of feces: Secondary | ICD-10-CM | POA: Diagnosis not present

## 2022-09-14 DIAGNOSIS — E43 Unspecified severe protein-calorie malnutrition: Secondary | ICD-10-CM | POA: Diagnosis not present

## 2022-09-15 DIAGNOSIS — R159 Full incontinence of feces: Secondary | ICD-10-CM | POA: Diagnosis not present

## 2022-09-15 DIAGNOSIS — F0280 Dementia in other diseases classified elsewhere without behavioral disturbance: Secondary | ICD-10-CM | POA: Diagnosis not present

## 2022-09-15 DIAGNOSIS — I739 Peripheral vascular disease, unspecified: Secondary | ICD-10-CM | POA: Diagnosis not present

## 2022-09-15 DIAGNOSIS — R627 Adult failure to thrive: Secondary | ICD-10-CM | POA: Diagnosis not present

## 2022-09-15 DIAGNOSIS — B351 Tinea unguium: Secondary | ICD-10-CM | POA: Diagnosis not present

## 2022-09-15 DIAGNOSIS — G311 Senile degeneration of brain, not elsewhere classified: Secondary | ICD-10-CM | POA: Diagnosis not present

## 2022-09-15 DIAGNOSIS — E43 Unspecified severe protein-calorie malnutrition: Secondary | ICD-10-CM | POA: Diagnosis not present

## 2022-09-15 DIAGNOSIS — R1312 Dysphagia, oropharyngeal phase: Secondary | ICD-10-CM | POA: Diagnosis not present

## 2022-09-20 DIAGNOSIS — G311 Senile degeneration of brain, not elsewhere classified: Secondary | ICD-10-CM | POA: Diagnosis not present

## 2022-09-20 DIAGNOSIS — F0280 Dementia in other diseases classified elsewhere without behavioral disturbance: Secondary | ICD-10-CM | POA: Diagnosis not present

## 2022-09-20 DIAGNOSIS — R159 Full incontinence of feces: Secondary | ICD-10-CM | POA: Diagnosis not present

## 2022-09-20 DIAGNOSIS — R627 Adult failure to thrive: Secondary | ICD-10-CM | POA: Diagnosis not present

## 2022-09-20 DIAGNOSIS — R1312 Dysphagia, oropharyngeal phase: Secondary | ICD-10-CM | POA: Diagnosis not present

## 2022-09-20 DIAGNOSIS — E43 Unspecified severe protein-calorie malnutrition: Secondary | ICD-10-CM | POA: Diagnosis not present

## 2022-09-21 DIAGNOSIS — R1312 Dysphagia, oropharyngeal phase: Secondary | ICD-10-CM | POA: Diagnosis not present

## 2022-09-21 DIAGNOSIS — R627 Adult failure to thrive: Secondary | ICD-10-CM | POA: Diagnosis not present

## 2022-09-21 DIAGNOSIS — R159 Full incontinence of feces: Secondary | ICD-10-CM | POA: Diagnosis not present

## 2022-09-21 DIAGNOSIS — G311 Senile degeneration of brain, not elsewhere classified: Secondary | ICD-10-CM | POA: Diagnosis not present

## 2022-09-21 DIAGNOSIS — E43 Unspecified severe protein-calorie malnutrition: Secondary | ICD-10-CM | POA: Diagnosis not present

## 2022-09-21 DIAGNOSIS — F0280 Dementia in other diseases classified elsewhere without behavioral disturbance: Secondary | ICD-10-CM | POA: Diagnosis not present

## 2022-10-04 DEATH — deceased

## 2022-10-05 ENCOUNTER — Ambulatory Visit: Payer: Medicare Other

## 2022-10-05 DIAGNOSIS — I495 Sick sinus syndrome: Secondary | ICD-10-CM

## 2022-11-25 NOTE — Progress Notes (Signed)
Remote pacemaker transmission.
